# Patient Record
Sex: Female | Born: 1950
Health system: Southern US, Community
[De-identification: ages and names within clinical notes are randomized; demographics above are authoritative.]

## PROBLEM LIST (undated history)

## (undated) DIAGNOSIS — R112 Nausea with vomiting, unspecified: Secondary | ICD-10-CM

## (undated) DIAGNOSIS — Z9889 Other specified postprocedural states: Secondary | ICD-10-CM

## (undated) DIAGNOSIS — M1990 Unspecified osteoarthritis, unspecified site: Secondary | ICD-10-CM

## (undated) DIAGNOSIS — E119 Type 2 diabetes mellitus without complications: Secondary | ICD-10-CM

## (undated) DIAGNOSIS — E079 Disorder of thyroid, unspecified: Secondary | ICD-10-CM

## (undated) DIAGNOSIS — E7800 Pure hypercholesterolemia, unspecified: Secondary | ICD-10-CM

## (undated) DIAGNOSIS — I10 Essential (primary) hypertension: Secondary | ICD-10-CM

## (undated) DIAGNOSIS — E039 Hypothyroidism, unspecified: Secondary | ICD-10-CM

## (undated) HISTORY — DX: Unspecified osteoarthritis, unspecified site: M19.90

## (undated) HISTORY — PX: BREAST BIOPSY: SHX20

## (undated) HISTORY — PX: CARPAL TUNNEL RELEASE: SHX101

## (undated) HISTORY — PX: JOINT REPLACEMENT: SHX530

## (undated) HISTORY — PX: KNEE CARTILAGE SURGERY: SHX688

---

## 2000-11-17 ENCOUNTER — Other Ambulatory Visit: Admission: RE | Admit: 2000-11-17 | Discharge: 2000-11-17 | Payer: Self-pay | Admitting: Family Medicine

## 2002-02-26 ENCOUNTER — Other Ambulatory Visit: Admission: RE | Admit: 2002-02-26 | Discharge: 2002-02-26 | Payer: Self-pay | Admitting: Family Medicine

## 2002-03-06 ENCOUNTER — Ambulatory Visit (HOSPITAL_COMMUNITY): Admission: RE | Admit: 2002-03-06 | Discharge: 2002-03-06 | Payer: Self-pay | Admitting: Family Medicine

## 2002-03-06 ENCOUNTER — Encounter: Payer: Self-pay | Admitting: Family Medicine

## 2003-03-20 ENCOUNTER — Other Ambulatory Visit: Admission: RE | Admit: 2003-03-20 | Discharge: 2003-03-20 | Payer: Self-pay | Admitting: Family Medicine

## 2003-03-26 ENCOUNTER — Ambulatory Visit (HOSPITAL_COMMUNITY): Admission: RE | Admit: 2003-03-26 | Discharge: 2003-03-26 | Payer: Self-pay | Admitting: Family Medicine

## 2004-03-25 ENCOUNTER — Other Ambulatory Visit: Admission: RE | Admit: 2004-03-25 | Discharge: 2004-03-25 | Payer: Self-pay | Admitting: Family Medicine

## 2004-03-30 ENCOUNTER — Ambulatory Visit (HOSPITAL_COMMUNITY): Admission: RE | Admit: 2004-03-30 | Discharge: 2004-03-30 | Payer: Self-pay | Admitting: Family Medicine

## 2004-10-26 ENCOUNTER — Ambulatory Visit (HOSPITAL_COMMUNITY): Admission: RE | Admit: 2004-10-26 | Discharge: 2004-10-26 | Payer: Self-pay | Admitting: Orthopaedic Surgery

## 2004-10-29 ENCOUNTER — Encounter: Admission: RE | Admit: 2004-10-29 | Discharge: 2004-11-18 | Payer: Self-pay | Admitting: Orthopaedic Surgery

## 2005-04-04 ENCOUNTER — Ambulatory Visit (HOSPITAL_COMMUNITY): Admission: RE | Admit: 2005-04-04 | Discharge: 2005-04-04 | Payer: Self-pay | Admitting: Family Medicine

## 2005-05-04 ENCOUNTER — Ambulatory Visit (HOSPITAL_COMMUNITY): Admission: RE | Admit: 2005-05-04 | Discharge: 2005-05-04 | Payer: Self-pay | Admitting: Family Medicine

## 2005-05-05 ENCOUNTER — Other Ambulatory Visit: Admission: RE | Admit: 2005-05-05 | Discharge: 2005-05-05 | Payer: Self-pay | Admitting: Family Medicine

## 2005-11-16 ENCOUNTER — Ambulatory Visit (HOSPITAL_COMMUNITY): Admission: RE | Admit: 2005-11-16 | Discharge: 2005-11-16 | Payer: Self-pay | Admitting: Family Medicine

## 2006-04-06 ENCOUNTER — Ambulatory Visit (HOSPITAL_COMMUNITY): Admission: RE | Admit: 2006-04-06 | Discharge: 2006-04-06 | Payer: Self-pay | Admitting: Family Medicine

## 2006-05-30 ENCOUNTER — Other Ambulatory Visit: Admission: RE | Admit: 2006-05-30 | Discharge: 2006-05-30 | Payer: Self-pay | Admitting: Family Medicine

## 2007-04-13 ENCOUNTER — Ambulatory Visit (HOSPITAL_COMMUNITY): Admission: RE | Admit: 2007-04-13 | Discharge: 2007-04-13 | Payer: Self-pay | Admitting: Family Medicine

## 2007-06-25 ENCOUNTER — Ambulatory Visit: Payer: Self-pay | Admitting: Gastroenterology

## 2007-07-03 ENCOUNTER — Encounter: Payer: Self-pay | Admitting: Gastroenterology

## 2007-07-03 ENCOUNTER — Ambulatory Visit: Payer: Self-pay | Admitting: Gastroenterology

## 2007-07-06 ENCOUNTER — Encounter: Payer: Self-pay | Admitting: Gastroenterology

## 2008-04-16 ENCOUNTER — Ambulatory Visit (HOSPITAL_COMMUNITY): Admission: RE | Admit: 2008-04-16 | Discharge: 2008-04-16 | Payer: Self-pay | Admitting: Family Medicine

## 2008-09-19 ENCOUNTER — Ambulatory Visit (HOSPITAL_COMMUNITY): Admission: RE | Admit: 2008-09-19 | Discharge: 2008-09-19 | Payer: Self-pay | Admitting: Orthopaedic Surgery

## 2009-04-17 ENCOUNTER — Ambulatory Visit (HOSPITAL_COMMUNITY): Admission: RE | Admit: 2009-04-17 | Discharge: 2009-04-17 | Payer: Self-pay | Admitting: Family Medicine

## 2010-02-08 ENCOUNTER — Emergency Department (HOSPITAL_COMMUNITY)
Admission: EM | Admit: 2010-02-08 | Discharge: 2010-02-08 | Payer: Self-pay | Source: Home / Self Care | Admitting: Emergency Medicine

## 2010-02-11 ENCOUNTER — Ambulatory Visit (HOSPITAL_COMMUNITY)
Admission: RE | Admit: 2010-02-11 | Discharge: 2010-02-11 | Payer: Self-pay | Source: Home / Self Care | Attending: Orthopaedic Surgery | Admitting: Orthopaedic Surgery

## 2010-03-30 ENCOUNTER — Other Ambulatory Visit: Payer: Self-pay | Admitting: Family Medicine

## 2010-03-30 DIAGNOSIS — Z139 Encounter for screening, unspecified: Secondary | ICD-10-CM

## 2010-04-19 ENCOUNTER — Ambulatory Visit (HOSPITAL_COMMUNITY): Payer: BC Managed Care – PPO

## 2010-04-22 ENCOUNTER — Ambulatory Visit (HOSPITAL_COMMUNITY)
Admission: RE | Admit: 2010-04-22 | Discharge: 2010-04-22 | Disposition: A | Discharge status: Home / Self Care | Payer: BC Managed Care – PPO | Source: Ambulatory Visit | Attending: Family Medicine | Admitting: Family Medicine

## 2010-04-22 DIAGNOSIS — Z1231 Encounter for screening mammogram for malignant neoplasm of breast: Secondary | ICD-10-CM | POA: Insufficient documentation

## 2010-04-22 DIAGNOSIS — Z139 Encounter for screening, unspecified: Secondary | ICD-10-CM

## 2010-07-16 NOTE — Op Note (Signed)
Mckenzie Leonard, Mckenzie Leonard                ACCOUNT NO.:  [id number]   MEDICAL RECORD NO.:  [id number]          PATIENT TYPE:  AMB   LOCATION:  DAY                           FACILITY:  [facility]   PHYSICIAN:  J. Darreld Mclean, M.D. DATE OF BIRTH:  09-12-1950   DATE OF PROCEDURE:  10/26/2004  DATE OF DISCHARGE:                                 OPERATIVE REPORT   PREOPERATIVE DIAGNOSIS:  Tear of the medial meniscus of the left knee.   POSTOPERATIVE DIAGNOSIS:  Tear of the medial meniscus of the left knee.   PROCEDURE:  Operative arthroscopy, partial medial meniscectomy using a  laser.   ANESTHESIA:  General.   TOURNIQUET TIME:  21 minutes.   DRAINS:  No drains.   SURGEON:  Dr. Hilda Lias.   INDICATIONS:  The patient is a 60 year old female with pain and tenderness  in the left knee. MRI showed some ________________ degeneration in the left  knee medial meniscus area, but she continued to have pain and tenderness and  her knee giving way, appears very painful, very tender, recurring effusions.  She is not responsive to conservative treatment. Recommend arthroscopy.  Risks and imponderables were discussed preoperatively with the patient who  appeared to understand and agreed to the procedure as outlined.   OPERATIVE FINDINGS:  Suprapatellar pouch looked normal. Insertion of the  patella had mild grade 2 changes more medially.   Medial side of the knee had grade 2 changes, particularly the femoral  condyle. There was a small tear of the posterior horn of the medial  meniscus. Anterior cruciate was intact. Lateral side looked normal with  normal appearing meniscus. No loose bodies.   DESCRIPTION OF PROCEDURE:  The patient was seen in the holding area. The  left knee was identified as the correct surgical site. She placed a mark on  the left knee; I placed a mark on the left knee. She was brought back to the  operating room. She was given general anesthesia while supine on the  operating room  table. Tourniquet placed deflated left upper thigh. Leg  holder attached. Patient prepped and draped in the usual manner. We had a  time out and identified the patient once more and identified the left knee  as the correct surgical site. Left leg was elevated. Esmarch bandage was  wrapped circumferentially. Tourniquet inflated to 300 mmHg, and Esmarch  bandage removed. Inflow cannula inserted medially, lactated ringers  instilled into the knee by an effusion pump. Arthroscope inserted laterally.  Knee systematically examined. Please see findings above. Using a laser, I  was able from another portal to get a good smooth contour of the tear. Probe  was also used. Good smooth contour was obtained. No new pathology was found.  Knee was systematically reexamined. Wounds were irrigated with remaining  part of lactated ringers. Wounds were reapproximated using 3-0 nylon  interrupted vertical mattress manner. Marcaine 0.25% instilled in each  portal. Tourniquet deflated after 21 minutes. Shoulder dressing applied.  Bulky dressing applied. Knee immobilizer applied. The patient tolerated the  procedure well and will go to recovery in good  condition. Prescription for  Vicodin ES given for pain. I will see her in the office approximately 10  days to 2 weeks. Physical therapy has been arranged. If any difficulties,  contact me through numbers that have been provided.           ______________________________  Shela Commons. Darreld Mclean, M.D.     JWK/MEDQ  D:  10/26/2004  T:  10/26/2004  Job:  [id number]

## 2010-07-16 NOTE — H&P (Signed)
Mckenzie Leonard, Mckenzie Leonard                ACCOUNT NO.:  [id number]   MEDICAL RECORD NO.:  [id number]          PATIENT TYPE:  AMB   LOCATION:  DAY                           FACILITY:  [facility]   PHYSICIAN:  J. Darreld Mclean, M.D. DATE OF BIRTH:  November 10, 1950   DATE OF ADMISSION:  DATE OF DISCHARGE:  LH                                HISTORY & PHYSICAL   CHIEF COMPLAINT:  Left knee pain.   HISTORY OF PRESENT ILLNESS:  The patient is a 60 year old female with pain  and tenderness in her left knee that has been present since the middle of  July.  Seen at [facility] ER on September 18, 2004.  X-rays of the knee were taken  and were negative.  She had an MRI of the left knee on September 22, 2004 at  [facility] showing some myxoid degeneration of the medial meniscus with no  tears.  The ligaments were normal.  She had pain and tenderness in her left  knee, particularly in the medial joint line.  I saw her in the office on  October 01, 2004.  I injected her knee with Depo-Medrol and gave her a knee  sleeve.  She was given Voltaren.  The pain has continued of her knee.  She  has giving way of the knee.  She has swelling of the knee.  The knee is  tender medially.  We tried several weeks of rest, anti-inflammatories, and  still has significant pain in her knee, and it is giving way more.  She may  have a meniscal tear medially, despite the MRI report.  Since she is not  improving, she is getting worse, with more swelling and more pain, have  recommended arthroscopy of the knee.  Risks and __________ were discussed.  She understands and agrees to the procedure as outlined.   PAST HISTORY:  Positive for hypertension.  Denies other medical problems.   ALLERGIES:  1.  SULFA.  2.  TETANUS.   CURRENT MEDICATIONS:  1.  AcipHex 20 mg daily.  2.  Diovan HCT 165 mg daily.  3.  Voltaren 75 mg twice a day.  4.  Vicodin ES for pain.   SOCIAL HISTORY:  She does not smoke.  Does not use alcoholic beverages.  A  high school  graduate.   FAMILY MEDICAL DOCTOR:  [facility] [place].   PAST SURGICAL HISTORY:  1.  She is status post carpal tunnel 6 years ago by me.  2.  Right breast biopsy 4 years ago by Dr. Gabriel Cirri.   FAMILY HISTORY:  Hypertension and heart troubles run in the family.   PHYSICAL EXAMINATION:  VITAL SIGNS:  Blood pressure is 128/82, respirations  16, afebrile, pulse 84.  Height 5 feet, 2.5 inches.  Weight 172.5 pounds.  GENERAL:  The patient is alert, cooperative, and oriented.  HEENT:  Negative.  NECK:  Supple.  LUNGS:  Clear to percussion and auscultation.  HEART:  Regular rhythm without murmur heard.  ABDOMEN:  Soft and tender without masses.  EXTREMITIES:  Her left knee is tender and has an effusion.  Particularly  tender in the medial joint line.  Positive McMurray medially.  Other joints  within normal limits.  CNS:  Intact.  SKIN:  Intact.   IMPRESSION:  Probably medial meniscal tear, left knee.   PLAN:  Arthroscopy of the left knee.   I have discussed with the patient the planned procedure, risks, and  __________.  Labs are pending.                                            ______________________________  J. Darreld Mclean, M.D.     JWK/MEDQ  D:  10/22/2004  T:  10/22/2004  Job:  [id number]

## 2011-04-04 ENCOUNTER — Other Ambulatory Visit: Payer: Self-pay | Admitting: Nurse Practitioner

## 2011-04-04 DIAGNOSIS — Z139 Encounter for screening, unspecified: Secondary | ICD-10-CM

## 2011-04-26 ENCOUNTER — Ambulatory Visit (HOSPITAL_COMMUNITY): Payer: BC Managed Care – PPO

## 2011-05-02 ENCOUNTER — Ambulatory Visit (HOSPITAL_COMMUNITY)
Admission: RE | Admit: 2011-05-02 | Discharge: 2011-05-02 | Disposition: A | Discharge status: Home / Self Care | Payer: BC Managed Care – PPO | Source: Ambulatory Visit | Attending: Nurse Practitioner | Admitting: Nurse Practitioner

## 2011-05-02 DIAGNOSIS — Z139 Encounter for screening, unspecified: Secondary | ICD-10-CM

## 2011-05-02 DIAGNOSIS — Z1231 Encounter for screening mammogram for malignant neoplasm of breast: Secondary | ICD-10-CM | POA: Insufficient documentation

## 2012-02-18 ENCOUNTER — Inpatient Hospital Stay (HOSPITAL_COMMUNITY)
Admission: EM | Admit: 2012-02-18 | Discharge: 2012-02-21 | DRG: 982 | Disposition: A | Discharge status: Home / Self Care | Payer: Worker's Compensation | Attending: Oral and Maxillofacial Surgery | Admitting: Oral and Maxillofacial Surgery

## 2012-02-18 ENCOUNTER — Emergency Department (HOSPITAL_COMMUNITY): Payer: Worker's Compensation

## 2012-02-18 ENCOUNTER — Encounter (HOSPITAL_COMMUNITY): Payer: Self-pay | Admitting: *Deleted

## 2012-02-18 DIAGNOSIS — E876 Hypokalemia: Secondary | ICD-10-CM | POA: Diagnosis present

## 2012-02-18 DIAGNOSIS — Y9269 Other specified industrial and construction area as the place of occurrence of the external cause: Secondary | ICD-10-CM

## 2012-02-18 DIAGNOSIS — E7800 Pure hypercholesterolemia, unspecified: Secondary | ICD-10-CM | POA: Diagnosis present

## 2012-02-18 DIAGNOSIS — E039 Hypothyroidism, unspecified: Secondary | ICD-10-CM | POA: Diagnosis present

## 2012-02-18 DIAGNOSIS — I10 Essential (primary) hypertension: Secondary | ICD-10-CM | POA: Diagnosis present

## 2012-02-18 DIAGNOSIS — Z79899 Other long term (current) drug therapy: Secondary | ICD-10-CM

## 2012-02-18 DIAGNOSIS — S02109A Fracture of base of skull, unspecified side, initial encounter for closed fracture: Principal | ICD-10-CM | POA: Diagnosis present

## 2012-02-18 DIAGNOSIS — S02401A Maxillary fracture, unspecified, initial encounter for closed fracture: Secondary | ICD-10-CM | POA: Diagnosis present

## 2012-02-18 DIAGNOSIS — S02400A Malar fracture unspecified, initial encounter for closed fracture: Secondary | ICD-10-CM | POA: Diagnosis present

## 2012-02-18 DIAGNOSIS — S0292XA Unspecified fracture of facial bones, initial encounter for closed fracture: Secondary | ICD-10-CM | POA: Diagnosis present

## 2012-02-18 DIAGNOSIS — S0219XA Other fracture of base of skull, initial encounter for closed fracture: Secondary | ICD-10-CM | POA: Diagnosis present

## 2012-02-18 DIAGNOSIS — E1159 Type 2 diabetes mellitus with other circulatory complications: Secondary | ICD-10-CM | POA: Insufficient documentation

## 2012-02-18 DIAGNOSIS — E785 Hyperlipidemia, unspecified: Secondary | ICD-10-CM | POA: Insufficient documentation

## 2012-02-18 DIAGNOSIS — S069X9A Unspecified intracranial injury with loss of consciousness of unspecified duration, initial encounter: Secondary | ICD-10-CM

## 2012-02-18 DIAGNOSIS — E119 Type 2 diabetes mellitus without complications: Secondary | ICD-10-CM | POA: Diagnosis present

## 2012-02-18 DIAGNOSIS — S0285XA Fracture of orbit, unspecified, initial encounter for closed fracture: Secondary | ICD-10-CM

## 2012-02-18 DIAGNOSIS — S020XXA Fracture of vault of skull, initial encounter for closed fracture: Secondary | ICD-10-CM

## 2012-02-18 DIAGNOSIS — S0280XA Fracture of other specified skull and facial bones, unspecified side, initial encounter for closed fracture: Secondary | ICD-10-CM | POA: Diagnosis present

## 2012-02-18 DIAGNOSIS — Y990 Civilian activity done for income or pay: Secondary | ICD-10-CM

## 2012-02-18 DIAGNOSIS — IMO0002 Reserved for concepts with insufficient information to code with codable children: Secondary | ICD-10-CM

## 2012-02-18 DIAGNOSIS — D62 Acute posthemorrhagic anemia: Secondary | ICD-10-CM | POA: Diagnosis present

## 2012-02-18 DIAGNOSIS — S098XXA Other specified injuries of head, initial encounter: Secondary | ICD-10-CM | POA: Diagnosis present

## 2012-02-18 DIAGNOSIS — F411 Generalized anxiety disorder: Secondary | ICD-10-CM | POA: Diagnosis present

## 2012-02-18 DIAGNOSIS — E1169 Type 2 diabetes mellitus with other specified complication: Secondary | ICD-10-CM | POA: Insufficient documentation

## 2012-02-18 DIAGNOSIS — W3189XA Contact with other specified machinery, initial encounter: Secondary | ICD-10-CM

## 2012-02-18 HISTORY — DX: Pure hypercholesterolemia, unspecified: E78.00

## 2012-02-18 HISTORY — DX: Essential (primary) hypertension: I10

## 2012-02-18 HISTORY — DX: Type 2 diabetes mellitus without complications: E11.9

## 2012-02-18 HISTORY — DX: Disorder of thyroid, unspecified: E07.9

## 2012-02-18 LAB — GLUCOSE, CAPILLARY: Glucose-Capillary: 160 mg/dL — ABNORMAL HIGH (ref 70–99)

## 2012-02-18 LAB — CBC WITH DIFFERENTIAL/PLATELET
Basophils Absolute: 0 10*3/uL (ref 0.0–0.1)
Basophils Relative: 0 % (ref 0–1)
Eosinophils Relative: 1 % (ref 0–5)
HCT: 35.8 % — ABNORMAL LOW (ref 36.0–46.0)
Lymphocytes Relative: 14 % (ref 12–46)
Lymphs Abs: 2 10*3/uL (ref 0.7–4.0)
MCHC: 33 g/dL (ref 30.0–36.0)
Monocytes Absolute: 0.7 10*3/uL (ref 0.1–1.0)
Neutro Abs: 11.7 10*3/uL — ABNORMAL HIGH (ref 1.7–7.7)
Neutrophils Relative %: 80 % — ABNORMAL HIGH (ref 43–77)
RBC: 4.02 MIL/uL (ref 3.87–5.11)

## 2012-02-18 LAB — BASIC METABOLIC PANEL
BUN: 19 mg/dL (ref 6–23)
GFR calc Af Amer: >90 mL/min (ref >90)
GFR calc non Af Amer: >90 mL/min (ref >90)
Glucose, Bld: 115 mg/dL — ABNORMAL HIGH (ref 70–99)
Sodium: 140 mEq/L (ref 135–145)

## 2012-02-18 MED ORDER — ONDANSETRON HCL 4 MG/2ML IJ SOLN
INTRAMUSCULAR | Status: AC
  Administered 2012-02-18: 4 mg via INTRAVENOUS
  Filled 2012-02-18: qty 2

## 2012-02-18 MED ORDER — PREDNISONE 50 MG PO TABS
60.0000 mg | ORAL_TABLET | Freq: Every day | ORAL | Status: AC
  Administered 2012-02-18 – 2012-02-20 (×3): 60 mg via ORAL
  Filled 2012-02-18 (×5): qty 1

## 2012-02-18 MED ORDER — TETANUS-DIPHTH-ACELL PERTUSSIS 5-2.5-18.5 LF-MCG/0.5 IM SUSP
0.5000 mL | Freq: Once | INTRAMUSCULAR | Status: AC
  Administered 2012-02-18: 0.5 mL via INTRAMUSCULAR

## 2012-02-18 MED ORDER — ONDANSETRON HCL 4 MG/2ML IJ SOLN
4.0000 mg | Freq: Once | INTRAMUSCULAR | Status: AC
  Administered 2012-02-18: 4 mg via INTRAVENOUS
  Filled 2012-02-18: qty 2

## 2012-02-18 MED ORDER — ONDANSETRON HCL 4 MG PO TABS: 4.0000 mg | ORAL_TABLET | Freq: Four times a day (QID) | ORAL | Status: DC | PRN

## 2012-02-18 MED ORDER — TETANUS-DIPHTH-ACELL PERTUSSIS 5-2.5-18.5 LF-MCG/0.5 IM SUSP
0.5000 mL | Freq: Once | INTRAMUSCULAR | Status: DC
  Filled 2012-02-18: qty 0.5

## 2012-02-18 MED ORDER — DEXAMETHASONE SODIUM PHOSPHATE 10 MG/ML IJ SOLN
10.0000 mg | Freq: Once | INTRAMUSCULAR | Status: AC
  Administered 2012-02-18: 10 mg via INTRAVENOUS
  Filled 2012-02-18: qty 1

## 2012-02-18 MED ORDER — MORPHINE SULFATE 4 MG/ML IJ SOLN
4.0000 mg | Freq: Once | INTRAMUSCULAR | Status: AC
  Administered 2012-02-18: 4 mg via INTRAVENOUS
  Filled 2012-02-18: qty 1

## 2012-02-18 MED ORDER — POTASSIUM CHLORIDE 10 MEQ/100ML IV SOLN
10.0000 meq | Freq: Once | INTRAVENOUS | Status: AC
  Administered 2012-02-18: 10 meq via INTRAVENOUS
  Filled 2012-02-18: qty 100

## 2012-02-18 MED ORDER — ONDANSETRON HCL 4 MG/2ML IJ SOLN
4.0000 mg | Freq: Once | INTRAMUSCULAR | Status: AC
  Administered 2012-02-18: 4 mg via INTRAVENOUS

## 2012-02-18 MED ORDER — DOCUSATE SODIUM 100 MG PO CAPS
100.0000 mg | ORAL_CAPSULE | Freq: Two times a day (BID) | ORAL | Status: DC
  Administered 2012-02-18 – 2012-02-20 (×5): 100 mg via ORAL
  Filled 2012-02-18 (×5): qty 1

## 2012-02-18 MED ORDER — ONDANSETRON HCL 4 MG/2ML IJ SOLN
4.0000 mg | Freq: Four times a day (QID) | INTRAMUSCULAR | Status: DC | PRN
  Administered 2012-02-18 – 2012-02-19 (×3): 4 mg via INTRAVENOUS
  Filled 2012-02-18 (×2): qty 2

## 2012-02-18 MED ORDER — POLYETHYLENE GLYCOL 3350 17 G PO PACK
17.0000 g | PACK | Freq: Every day | ORAL | Status: DC
  Administered 2012-02-19: 17 g via ORAL
  Filled 2012-02-18 (×4): qty 1

## 2012-02-18 MED ORDER — MORPHINE SULFATE 4 MG/ML IJ SOLN
4.0000 mg | Freq: Once | INTRAMUSCULAR | Status: AC
  Administered 2012-02-18: 4 mg via INTRAVENOUS

## 2012-02-18 MED ORDER — HYDROCODONE-ACETAMINOPHEN 10-325 MG PO TABS
0.5000 | ORAL_TABLET | ORAL | Status: DC | PRN
  Administered 2012-02-19: 1 via ORAL
  Administered 2012-02-21: 2 via ORAL
  Filled 2012-02-18: qty 2
  Filled 2012-02-18: qty 1

## 2012-02-18 MED ORDER — SODIUM CHLORIDE 0.45 % IV SOLN
INTRAVENOUS | Status: DC
  Administered 2012-02-18 – 2012-02-20 (×3): via INTRAVENOUS
  Filled 2012-02-18 (×11): qty 1000

## 2012-02-18 MED ORDER — INSULIN ASPART 100 UNIT/ML ~~LOC~~ SOLN
0.0000 [IU] | Freq: Three times a day (TID) | SUBCUTANEOUS | Status: DC
  Administered 2012-02-18: 4 [IU] via SUBCUTANEOUS
  Administered 2012-02-19: 3 [IU] via SUBCUTANEOUS

## 2012-02-18 MED ORDER — HYDROMORPHONE HCL PF 1 MG/ML IJ SOLN
0.5000 mg | INTRAMUSCULAR | Status: DC | PRN
  Administered 2012-02-18 – 2012-02-20 (×4): 0.5 mg via INTRAVENOUS
  Filled 2012-02-18 (×4): qty 1

## 2012-02-18 MED ORDER — ONDANSETRON HCL 4 MG/2ML IJ SOLN
INTRAMUSCULAR | Status: AC
  Filled 2012-02-18: qty 2

## 2012-02-18 MED ORDER — MORPHINE SULFATE 4 MG/ML IJ SOLN
INTRAMUSCULAR | Status: AC
  Filled 2012-02-18: qty 1

## 2012-02-18 MED ORDER — POTASSIUM CHLORIDE CRYS ER 20 MEQ PO TBCR
40.0000 meq | EXTENDED_RELEASE_TABLET | Freq: Two times a day (BID) | ORAL | Status: DC
  Administered 2012-02-18 (×2): 40 meq via ORAL
  Filled 2012-02-18 (×4): qty 2

## 2012-02-18 NOTE — ED Provider Notes (Signed)
History     CSN: [id number]  Arrival date & time 02/18/12  0709   First MD Initiated Contact with Patient 02/18/12 0710      Chief Complaint  Patient presents with  . Head Injury    (Consider location/radiation/quality/duration/timing/severity/associated sxs/prior treatment) Patient is a 61 y.o. female presenting with head injury. The history is provided by the patient and the EMS personnel.  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 She denies loss of consciousness. She denies other injury. Pain is severe and she rates it 10/10. She was brought in by ambulance who reports no difficulty with airway. She does not on her last tetanus immunization was.  No past medical history on file.  No past surgical history on file.  No family history on file.  History  Substance Use Topics  . Smoking status: Not on file  . Smokeless tobacco: Not on file  . Alcohol Use: Not on file    OB History    No data available      Review of Systems  All other systems reviewed and are negative.    Allergies  Review of patient's allergies indicates not on file.  Home Medications  No current outpatient prescriptions on file.  There were no vitals taken for this visit.  Physical Exam  Nursing note and vitals reviewed. 61 year old female, on a long spine board with stiff cervical collar in place and in no acute distress. Vital signs are significant for hypertension with blood pressure 153/79. Oxygen saturation is 100%, which is normal. Head is normocephalic. There is soft tissue swelling of the right side of the face with periorbital ecchymosis on the right. PERRLA, EOMI. No restriction in extra ocular movement is noted. Oropharynx is clear. Small laceration is noted on the upper lip-not requiring closure. Neck is nontender without adenopathy or JVD. Back is nontender and there is no CVA tenderness. Lungs are clear  without rales, wheezes, or rhonchi. Chest is nontender. Heart has regular rate and rhythm without murmur. Abdomen is soft, flat, nontender without masses or hepatosplenomegaly and peristalsis is normoactive. Extremities have no cyanosis or edema, full range of motion is present. Skin is warm and dry without rash. Neurologic: Mental status is normal, cranial nerves are intact, there are no motor or sensory deficits.   ED Course  Procedures (including critical care time)  Results for orders placed during the hospital encounter of 02/18/12  CBC WITH DIFFERENTIAL      Component Value Range   WBC 14.6 (*) 4.0 - 10.5 K/uL   RBC 4.02  3.87 - 5.11 MIL/uL   Hemoglobin 11.8 (*) 12.0 - 15.0 g/dL   HCT 82.9 (*) 56.2 - 13.0 %   MCV 89.1  78.0 - 100.0 fL   MCH 29.4  26.0 - 34.0 pg   MCHC 33.0  30.0 - 36.0 g/dL   RDW 86.5  78.4 - 69.6 %   Platelets 254  150 - 400 K/uL   Neutrophils Relative 80 (*) 43 - 77 %   Neutro Abs 11.7 (*) 1.7 - 7.7 K/uL   Lymphocytes Relative 14  12 - 46 %   Lymphs Abs 2.0  0.7 - 4.0 K/uL   Monocytes Relative 5  3 - 12 %   Monocytes Absolute 0.7  0.1 - 1.0 K/uL   Eosinophils Relative 1  0 - 5 %   Eosinophils Absolute  0.1  0.0 - 0.7 K/uL   Basophils Relative 0  0 - 1 %   Basophils Absolute 0.0  0.0 - 0.1 K/uL  BASIC METABOLIC PANEL      Component Value Range   Sodium 140  135 - 145 mEq/L   Potassium 2.9 (*) 3.5 - 5.1 mEq/L   Chloride 102  96 - 112 mEq/L   CO2 24  19 - 32 mEq/L   Glucose, Bld 115 (*) 70 - 99 mg/dL   BUN 19  6 - 23 mg/dL   Creatinine, Ser 1.61  0.50 - 1.10 mg/dL   Calcium 8.9  8.4 - 09.6 mg/dL   GFR calc non Af Amer >90  >90 mL/min   GFR calc Af Amer >90  >90 mL/min  GLUCOSE, CAPILLARY      Component Value Range   Glucose-Capillary 159 (*) 70 - 99 mg/dL   Dg Shoulder Right  04/54/0981  *RADIOLOGY REPORT*  Clinical Data: Pain post trauma  RIGHT SHOULDER - 2+ VIEW  Comparison: None.  Findings: Three views of the right shoulder submitted.  No  acute fracture or subluxation.  Mild degenerative changes right AC joint.  IMPRESSION: No acute fracture or subluxation.  Mild degenerative changes right AC joint.   Original Report Authenticated By: Natasha Mead, M.D.    Ct Head Wo Contrast  02/18/2012  *RADIOLOGY REPORT*  Clinical Data:  Blunt trauma to the face at work, hit in right side of face and eye with swelling  CT HEAD WITHOUT CONTRAST CT MAXILLOFACIAL WITHOUT CONTRAST CT CERVICAL SPINE WITHOUT CONTRAST  Technique:  Multidetector CT imaging of the head, cervical spine, and maxillofacial structures were performed using the standard protocol without intravenous contrast. Multiplanar CT image reconstructions of the cervical spine and maxillofacial structures were also generated.  Comparison:  CT brain of 01/31/2009  CT HEAD  Findings: The ventricular system is stable in size and configuration, and the septum remains midline position.  No hemorrhage, mass lesion, or acute infarction is seen.  A right facial scalp hematoma is noted which will be better assessed on CT maxillofacial.  On bone window images, fractures of the right maxillary sinus and right zygomatic arch are noted with opacification of the right maxillary sinus.  The right orbital rim also is fractured.  There is a small amount of periorbital air. A minimally depressed fracture of the right temporal calvarium is noted.  IMPRESSION:  1.  No acute intracranial abnormality. 2.  Fractures of the right maxillary sinus, right zygomatic arch, and right orbital rim with some periorbital air on the right. 3.  Slightly depressed fracture of the right temporal calvarium.  CT MAXILLOFACIAL  Findings:  On bone window images, there is fracture of the anterior, lateral, and posterior walls of the right maxillary sinus with complete opacification of the right maxillary sinus.  There is comminuted fracture of the right zygomatic arch involving the frontozygomatic, mid zygomatic, and posterior aspects of the  zygomatic arch with some displacement.  There is fracture of the right lateral orbital rim with slight displacement and some narrowing of the optic canal superiorly.  There is also a fracture of the right temporal calvarium with slight depression. There is a large scalp hematoma overlying the right lateral orbital rim and right temporal calvarium with some air in the soft tissues.  The mastoid air cells appear pneumatized.  No mandibular fracture is seen.  Both mandibular condyles appear to be in normal position. The left frontal sinus is aerated.  The nasal airway is patent and the turbinates are normal position.  IMPRESSION:  1.  Comminuted fracture of the anterior, lateral, and posterior walls of the right maxillary sinus with complete opacification of the right maxillary sinus. 2.  Fracture of the right lateral orbital rim with some periorbital air. 3.  Comminuted fracture of the right zygomatic arch with displacement of fragments. 4.  Slightly depressed fracture of the right temporal calvarium with overlying scalp hematoma.  CT CERVICAL SPINE  Findings:   The cervical vertebrae are in normal alignment.  There is degenerative disc disease particularly at the C6-7 level with loss of disc space, sclerosis, and spurring.  No prevertebral soft tissue swelling is seen.  The odontoid process is intact.  No cervical spine fracture is seen.  Foraminal narrowing is noted particularly the C6-7 level on the right.  The thyroid gland is unremarkable.  IMPRESSION:  1.  Normal alignment.  No acute fracture. 2.  Degenerative disc disease primarily at C6-7.   Original Report Authenticated By: Dwyane Dee, M.D.    Ct Cervical Spine Wo Contrast  02/18/2012  *RADIOLOGY REPORT*  Clinical Data:  Blunt trauma to the face at work, hit in right side of face and eye with swelling  CT HEAD WITHOUT CONTRAST CT MAXILLOFACIAL WITHOUT CONTRAST CT CERVICAL SPINE WITHOUT CONTRAST  Technique:  Multidetector CT imaging of the head, cervical  spine, and maxillofacial structures were performed using the standard protocol without intravenous contrast. Multiplanar CT image reconstructions of the cervical spine and maxillofacial structures were also generated.  Comparison:  CT brain of 01/31/2009  CT HEAD  Findings: The ventricular system is stable in size and configuration, and the septum remains midline position.  No hemorrhage, mass lesion, or acute infarction is seen.  A right facial scalp hematoma is noted which will be better assessed on CT maxillofacial.  On bone window images, fractures of the right maxillary sinus and right zygomatic arch are noted with opacification of the right maxillary sinus.  The right orbital rim also is fractured.  There is a small amount of periorbital air. A minimally depressed fracture of the right temporal calvarium is noted.  IMPRESSION:  1.  No acute intracranial abnormality. 2.  Fractures of the right maxillary sinus, right zygomatic arch, and right orbital rim with some periorbital air on the right. 3.  Slightly depressed fracture of the right temporal calvarium.  CT MAXILLOFACIAL  Findings:  On bone window images, there is fracture of the anterior, lateral, and posterior walls of the right maxillary sinus with complete opacification of the right maxillary sinus.  There is comminuted fracture of the right zygomatic arch involving the frontozygomatic, mid zygomatic, and posterior aspects of the zygomatic arch with some displacement.  There is fracture of the right lateral orbital rim with slight displacement and some narrowing of the optic canal superiorly.  There is also a fracture of the right temporal calvarium with slight depression. There is a large scalp hematoma overlying the right lateral orbital rim and right temporal calvarium with some air in the soft tissues.  The mastoid air cells appear pneumatized.  No mandibular fracture is seen.  Both mandibular condyles appear to be in normal position. The left  frontal sinus is aerated.  The nasal airway is patent and the turbinates are normal position.  IMPRESSION:  1.  Comminuted fracture of the anterior, lateral, and posterior walls of the right maxillary sinus with complete opacification of the right maxillary sinus. 2.  Fracture of  the right lateral orbital rim with some periorbital air. 3.  Comminuted fracture of the right zygomatic arch with displacement of fragments. 4.  Slightly depressed fracture of the right temporal calvarium with overlying scalp hematoma.  CT CERVICAL SPINE  Findings:   The cervical vertebrae are in normal alignment.  There is degenerative disc disease particularly at the C6-7 level with loss of disc space, sclerosis, and spurring.  No prevertebral soft tissue swelling is seen.  The odontoid process is intact.  No cervical spine fracture is seen.  Foraminal narrowing is noted particularly the C6-7 level on the right.  The thyroid gland is unremarkable.  IMPRESSION:  1.  Normal alignment.  No acute fracture. 2.  Degenerative disc disease primarily at C6-7.   Original Report Authenticated By: Dwyane Dee, M.D.    Ct Maxillofacial Wo Cm  02/18/2012  *RADIOLOGY REPORT*  Clinical Data:  Blunt trauma to the face at work, hit in right side of face and eye with swelling  CT HEAD WITHOUT CONTRAST CT MAXILLOFACIAL WITHOUT CONTRAST CT CERVICAL SPINE WITHOUT CONTRAST  Technique:  Multidetector CT imaging of the head, cervical spine, and maxillofacial structures were performed using the standard protocol without intravenous contrast. Multiplanar CT image reconstructions of the cervical spine and maxillofacial structures were also generated.  Comparison:  CT brain of 01/31/2009  CT HEAD  Findings: The ventricular system is stable in size and configuration, and the septum remains midline position.  No hemorrhage, mass lesion, or acute infarction is seen.  A right facial scalp hematoma is noted which will be better assessed on CT maxillofacial.  On bone  window images, fractures of the right maxillary sinus and right zygomatic arch are noted with opacification of the right maxillary sinus.  The right orbital rim also is fractured.  There is a small amount of periorbital air. A minimally depressed fracture of the right temporal calvarium is noted.  IMPRESSION:  1.  No acute intracranial abnormality. 2.  Fractures of the right maxillary sinus, right zygomatic arch, and right orbital rim with some periorbital air on the right. 3.  Slightly depressed fracture of the right temporal calvarium.  CT MAXILLOFACIAL  Findings:  On bone window images, there is fracture of the anterior, lateral, and posterior walls of the right maxillary sinus with complete opacification of the right maxillary sinus.  There is comminuted fracture of the right zygomatic arch involving the frontozygomatic, mid zygomatic, and posterior aspects of the zygomatic arch with some displacement.  There is fracture of the right lateral orbital rim with slight displacement and some narrowing of the optic canal superiorly.  There is also a fracture of the right temporal calvarium with slight depression. There is a large scalp hematoma overlying the right lateral orbital rim and right temporal calvarium with some air in the soft tissues.  The mastoid air cells appear pneumatized.  No mandibular fracture is seen.  Both mandibular condyles appear to be in normal position. The left frontal sinus is aerated.  The nasal airway is patent and the turbinates are normal position.  IMPRESSION:  1.  Comminuted fracture of the anterior, lateral, and posterior walls of the right maxillary sinus with complete opacification of the right maxillary sinus. 2.  Fracture of the right lateral orbital rim with some periorbital air. 3.  Comminuted fracture of the right zygomatic arch with displacement of fragments. 4.  Slightly depressed fracture of the right temporal calvarium with overlying scalp hematoma.  CT CERVICAL SPINE   Findings:  The cervical vertebrae are in normal alignment.  There is degenerative disc disease particularly at the C6-7 level with loss of disc space, sclerosis, and spurring.  No prevertebral soft tissue swelling is seen.  The odontoid process is intact.  No cervical spine fracture is seen.  Foraminal narrowing is noted particularly the C6-7 level on the right.  The thyroid gland is unremarkable.  IMPRESSION:  1.  Normal alignment.  No acute fracture. 2.  Degenerative disc disease primarily at C6-7.   Original Report Authenticated By: Dwyane Dee, M.D.    Images viewed by me.   1. Fracture, facial bones   2. Fracture of orbit     CRITICAL CARE Performed by: ZOXWR,UEAVW   Total critical care time: 40 minutes  Critical care time was exclusive of separately billable procedures and treating other patients.  Critical care was necessary to treat or prevent imminent or life-threatening deterioration.  Critical care was time spent personally by me on the following activities: development of treatment plan with patient and/or surrogate as well as nursing, discussions with consultants, evaluation of patient's response to treatment, examination of patient, obtaining history from patient or surrogate, ordering and performing treatments and interventions, ordering and review of laboratory studies, ordering and review of radiographic studies, pulse oximetry and re-evaluation of patient's condition.   MDM  Blunt facial trauma. She will be sent for CT scans. TdAP booster is given.  CT scans show fractures through the zygoma, maxilla sinus, and orbital floor as well as mildly depressed temporal bone fracture. She has required several injections of narcotics for pain control. I am concerned about the size of the hematoma which is present on CT scan and the patient's ability to take fluids as well as possible worsening of airway. Case is discussed with Dr. Eliott Nine of oral surgery who has come to evaluate the  patient, as well as Dr. Carolynne Edouard of trauma surgery, and Dr. Mikal Plane of neurosurgery. Patient will be admitted for observation and pain control. Per Dr. Deirdre Peer request, an initial dose of dexamethasone was given a try and minimize her swelling.    Dione Booze, MD 02/18/12 [phone]

## 2012-02-18 NOTE — Consult Note (Signed)
Reason for Consult:right temporal fracture Referring Physician: Trauma  Mckenzie Leonard is an 61 y.o. female.  HPI: whom was in her usual state of health until early this am when she was struck in the face by an industrial spool. She did not lose consciousness. Head CT [facility] revealed multiple facial fractures and a mildly depressed temporal bone fracture on the right.   Past Medical History  Diagnosis Date  . Hypertension   . Diabetes mellitus without complication   . Thyroid disease   . High cholesterol     Past Surgical History  Procedure Date  . Knee cartilage surgery     Right  . Carpal tunnel release     Right    History reviewed. No pertinent family history.  Social History:  reports that she has never smoked. She has never used smokeless tobacco. She reports that she does not drink alcohol or use illicit drugs.  Allergies:  Allergies  Allergen Reactions  . Sulfa Antibiotics Anaphylaxis  . Tetanus Toxoids     Reports flu like symptoms and swelling at injection site only    Medications: I have reviewed the patient's current medications.  Results for orders placed during the hospital encounter of 02/18/12 (from the past 48 hour(s))  CBC WITH DIFFERENTIAL     Status: Abnormal   Collection Time   02/18/12  7:18 AM      Component Value Range Comment   WBC 14.6 (*) 4.0 - 10.5 K/uL    RBC 4.02  3.87 - 5.11 MIL/uL    Hemoglobin 11.8 (*) 12.0 - 15.0 g/dL    HCT 45.4 (*) 09.8 - 46.0 %    MCV 89.1  78.0 - 100.0 fL    MCH 29.4  26.0 - 34.0 pg    MCHC 33.0  30.0 - 36.0 g/dL    RDW 11.9  14.7 - 82.9 %    Platelets 254  150 - 400 K/uL    Neutrophils Relative 80 (*) 43 - 77 %    Neutro Abs 11.7 (*) 1.7 - 7.7 K/uL    Lymphocytes Relative 14  12 - 46 %    Lymphs Abs 2.0  0.7 - 4.0 K/uL    Monocytes Relative 5  3 - 12 %    Monocytes Absolute 0.7  0.1 - 1.0 K/uL    Eosinophils Relative 1  0 - 5 %    Eosinophils Absolute 0.1  0.0 - 0.7 K/uL    Basophils Relative 0  0 - 1 %     Basophils Absolute 0.0  0.0 - 0.1 K/uL   BASIC METABOLIC PANEL     Status: Abnormal   Collection Time   02/18/12  7:18 AM      Component Value Range Comment   Sodium 140  135 - 145 mEq/L    Potassium 2.9 (*) 3.5 - 5.1 mEq/L    Chloride 102  96 - 112 mEq/L    CO2 24  19 - 32 mEq/L    Glucose, Bld 115 (*) 70 - 99 mg/dL    BUN 19  6 - 23 mg/dL    Creatinine, Ser 5.62  0.50 - 1.10 mg/dL    Calcium 8.9  8.4 - 13.0 mg/dL    GFR calc non Af Amer >90  >90 mL/min    GFR calc Af Amer >90  >90 mL/min     Dg Shoulder Right  02/18/2012  *RADIOLOGY REPORT*  Clinical Data: Pain post trauma  RIGHT  SHOULDER - 2+ VIEW  Comparison: None.  Findings: Three views of the right shoulder submitted.  No acute fracture or subluxation.  Mild degenerative changes right AC joint.  IMPRESSION: No acute fracture or subluxation.  Mild degenerative changes right AC joint.   Original Report Authenticated By: Natasha Mead, M.D.    Ct Head Wo Contrast  02/18/2012  *RADIOLOGY REPORT*  Clinical Data:  Blunt trauma to the face at work, hit in right side of face and eye with swelling  CT HEAD WITHOUT CONTRAST CT MAXILLOFACIAL WITHOUT CONTRAST CT CERVICAL SPINE WITHOUT CONTRAST  Technique:  Multidetector CT imaging of the head, cervical spine, and maxillofacial structures were performed using the standard protocol without intravenous contrast. Multiplanar CT image reconstructions of the cervical spine and maxillofacial structures were also generated.  Comparison:  CT brain of 01/31/2009  CT HEAD  Findings: The ventricular system is stable in size and configuration, and the septum remains midline position.  No hemorrhage, mass lesion, or acute infarction is seen.  A right facial scalp hematoma is noted which will be better assessed on CT maxillofacial.  On bone window images, fractures of the right maxillary sinus and right zygomatic arch are noted with opacification of the right maxillary sinus.  The right orbital rim also is  fractured.  There is a small amount of periorbital air. A minimally depressed fracture of the right temporal calvarium is noted.  IMPRESSION:  1.  No acute intracranial abnormality. 2.  Fractures of the right maxillary sinus, right zygomatic arch, and right orbital rim with some periorbital air on the right. 3.  Slightly depressed fracture of the right temporal calvarium.  CT MAXILLOFACIAL  Findings:  On bone window images, there is fracture of the anterior, lateral, and posterior walls of the right maxillary sinus with complete opacification of the right maxillary sinus.  There is comminuted fracture of the right zygomatic arch involving the frontozygomatic, mid zygomatic, and posterior aspects of the zygomatic arch with some displacement.  There is fracture of the right lateral orbital rim with slight displacement and some narrowing of the optic canal superiorly.  There is also a fracture of the right temporal calvarium with slight depression. There is a large scalp hematoma overlying the right lateral orbital rim and right temporal calvarium with some air in the soft tissues.  The mastoid air cells appear pneumatized.  No mandibular fracture is seen.  Both mandibular condyles appear to be in normal position. The left frontal sinus is aerated.  The nasal airway is patent and the turbinates are normal position.  IMPRESSION:  1.  Comminuted fracture of the anterior, lateral, and posterior walls of the right maxillary sinus with complete opacification of the right maxillary sinus. 2.  Fracture of the right lateral orbital rim with some periorbital air. 3.  Comminuted fracture of the right zygomatic arch with displacement of fragments. 4.  Slightly depressed fracture of the right temporal calvarium with overlying scalp hematoma.  CT CERVICAL SPINE  Findings:   The cervical vertebrae are in normal alignment.  There is degenerative disc disease particularly at the C6-7 level with loss of disc space, sclerosis, and  spurring.  No prevertebral soft tissue swelling is seen.  The odontoid process is intact.  No cervical spine fracture is seen.  Foraminal narrowing is noted particularly the C6-7 level on the right.  The thyroid gland is unremarkable.  IMPRESSION:  1.  Normal alignment.  No acute fracture. 2.  Degenerative disc disease primarily at C6-7.  Original Report Authenticated By: Dwyane Dee, M.D.    Ct Cervical Spine Wo Contrast  02/18/2012  *RADIOLOGY REPORT*  Clinical Data:  Blunt trauma to the face at work, hit in right side of face and eye with swelling  CT HEAD WITHOUT CONTRAST CT MAXILLOFACIAL WITHOUT CONTRAST CT CERVICAL SPINE WITHOUT CONTRAST  Technique:  Multidetector CT imaging of the head, cervical spine, and maxillofacial structures were performed using the standard protocol without intravenous contrast. Multiplanar CT image reconstructions of the cervical spine and maxillofacial structures were also generated.  Comparison:  CT brain of 01/31/2009  CT HEAD  Findings: The ventricular system is stable in size and configuration, and the septum remains midline position.  No hemorrhage, mass lesion, or acute infarction is seen.  A right facial scalp hematoma is noted which will be better assessed on CT maxillofacial.  On bone window images, fractures of the right maxillary sinus and right zygomatic arch are noted with opacification of the right maxillary sinus.  The right orbital rim also is fractured.  There is a small amount of periorbital air. A minimally depressed fracture of the right temporal calvarium is noted.  IMPRESSION:  1.  No acute intracranial abnormality. 2.  Fractures of the right maxillary sinus, right zygomatic arch, and right orbital rim with some periorbital air on the right. 3.  Slightly depressed fracture of the right temporal calvarium.  CT MAXILLOFACIAL  Findings:  On bone window images, there is fracture of the anterior, lateral, and posterior walls of the right maxillary sinus with  complete opacification of the right maxillary sinus.  There is comminuted fracture of the right zygomatic arch involving the frontozygomatic, mid zygomatic, and posterior aspects of the zygomatic arch with some displacement.  There is fracture of the right lateral orbital rim with slight displacement and some narrowing of the optic canal superiorly.  There is also a fracture of the right temporal calvarium with slight depression. There is a large scalp hematoma overlying the right lateral orbital rim and right temporal calvarium with some air in the soft tissues.  The mastoid air cells appear pneumatized.  No mandibular fracture is seen.  Both mandibular condyles appear to be in normal position. The left frontal sinus is aerated.  The nasal airway is patent and the turbinates are normal position.  IMPRESSION:  1.  Comminuted fracture of the anterior, lateral, and posterior walls of the right maxillary sinus with complete opacification of the right maxillary sinus. 2.  Fracture of the right lateral orbital rim with some periorbital air. 3.  Comminuted fracture of the right zygomatic arch with displacement of fragments. 4.  Slightly depressed fracture of the right temporal calvarium with overlying scalp hematoma.  CT CERVICAL SPINE  Findings:   The cervical vertebrae are in normal alignment.  There is degenerative disc disease particularly at the C6-7 level with loss of disc space, sclerosis, and spurring.  No prevertebral soft tissue swelling is seen.  The odontoid process is intact.  No cervical spine fracture is seen.  Foraminal narrowing is noted particularly the C6-7 level on the right.  The thyroid gland is unremarkable.  IMPRESSION:  1.  Normal alignment.  No acute fracture. 2.  Degenerative disc disease primarily at C6-7.   Original Report Authenticated By: Dwyane Dee, M.D.    Ct Maxillofacial Wo Cm  02/18/2012  *RADIOLOGY REPORT*  Clinical Data:  Blunt trauma to the face at work, hit in right side of face  and eye with swelling  CT  HEAD WITHOUT CONTRAST CT MAXILLOFACIAL WITHOUT CONTRAST CT CERVICAL SPINE WITHOUT CONTRAST  Technique:  Multidetector CT imaging of the head, cervical spine, and maxillofacial structures were performed using the standard protocol without intravenous contrast. Multiplanar CT image reconstructions of the cervical spine and maxillofacial structures were also generated.  Comparison:  CT brain of 01/31/2009  CT HEAD  Findings: The ventricular system is stable in size and configuration, and the septum remains midline position.  No hemorrhage, mass lesion, or acute infarction is seen.  A right facial scalp hematoma is noted which will be better assessed on CT maxillofacial.  On bone window images, fractures of the right maxillary sinus and right zygomatic arch are noted with opacification of the right maxillary sinus.  The right orbital rim also is fractured.  There is a small amount of periorbital air. A minimally depressed fracture of the right temporal calvarium is noted.  IMPRESSION:  1.  No acute intracranial abnormality. 2.  Fractures of the right maxillary sinus, right zygomatic arch, and right orbital rim with some periorbital air on the right. 3.  Slightly depressed fracture of the right temporal calvarium.  CT MAXILLOFACIAL  Findings:  On bone window images, there is fracture of the anterior, lateral, and posterior walls of the right maxillary sinus with complete opacification of the right maxillary sinus.  There is comminuted fracture of the right zygomatic arch involving the frontozygomatic, mid zygomatic, and posterior aspects of the zygomatic arch with some displacement.  There is fracture of the right lateral orbital rim with slight displacement and some narrowing of the optic canal superiorly.  There is also a fracture of the right temporal calvarium with slight depression. There is a large scalp hematoma overlying the right lateral orbital rim and right temporal calvarium with  some air in the soft tissues.  The mastoid air cells appear pneumatized.  No mandibular fracture is seen.  Both mandibular condyles appear to be in normal position. The left frontal sinus is aerated.  The nasal airway is patent and the turbinates are normal position.  IMPRESSION:  1.  Comminuted fracture of the anterior, lateral, and posterior walls of the right maxillary sinus with complete opacification of the right maxillary sinus. 2.  Fracture of the right lateral orbital rim with some periorbital air. 3.  Comminuted fracture of the right zygomatic arch with displacement of fragments. 4.  Slightly depressed fracture of the right temporal calvarium with overlying scalp hematoma.  CT CERVICAL SPINE  Findings:   The cervical vertebrae are in normal alignment.  There is degenerative disc disease particularly at the C6-7 level with loss of disc space, sclerosis, and spurring.  No prevertebral soft tissue swelling is seen.  The odontoid process is intact.  No cervical spine fracture is seen.  Foraminal narrowing is noted particularly the C6-7 level on the right.  The thyroid gland is unremarkable.  IMPRESSION:  1.  Normal alignment.  No acute fracture. 2.  Degenerative disc disease primarily at C6-7.   Original Report Authenticated By: Dwyane Dee, M.D.     Review of Systems  Constitutional: Negative.   HENT: Positive for neck pain.   Eyes: Negative.   Respiratory: Negative.   Cardiovascular: Negative.   Gastrointestinal: Negative.   Genitourinary: Negative.   Skin: Negative.   Neurological: Positive for headaches.  Endo/Heme/Allergies: Negative.   Psychiatric/Behavioral: Negative.    Blood pressure 133/74, pulse 74, temperature 97.4 F (36.3 C), temperature source Oral, resp. rate 16, SpO2 100.00%. Physical Exam  Constitutional:  She is oriented to person, place, and time. She appears well-developed and well-nourished. No distress.  HENT:  Head: Head is with raccoon's eyes, with laceration and with  right periorbital erythema.    Right Ear: External ear normal.  Left Ear: External ear normal.  Eyes: EOM are normal. Pupils are equal, round, and reactive to light.  Neck: Normal range of motion. Neck supple.  Cardiovascular: Normal rate, regular rhythm and normal heart sounds.   Respiratory: Effort normal and breath sounds normal.  GI: Soft. Bowel sounds are normal.  Neurological: She is alert and oriented to person, place, and time. She has normal reflexes. She displays normal reflexes. No cranial nerve deficit. She exhibits normal muscle tone. Coordination normal.  Skin: Skin is warm and dry.  Psychiatric: She has a normal mood and affect. Her behavior is normal. Judgment and thought content normal.    Assessment/Plan: Mckenzie Leonard is a 61 y.o. female With a  Normal neurologic exam. The  Temporal bone fracture does not require surgery . No need for neurosurgical followup. Contact me if you have questions.  Colbi Schiltz L 02/18/2012, 1:36 PM

## 2012-02-18 NOTE — ED Notes (Signed)
Patient transported to CT 

## 2012-02-18 NOTE — Consult Note (Signed)
Oral and Maxillofacial Surgery Consult Reason for Consult: Facial Fractures Referring Physician: Dr. [name] is an 61 y.o. female.  HPI: Mckenzie Leonard was at work at a [employer] in the [place] area this morning at 06:30 when a spool of yarn it her in the right face.  She denies loss of consciousness.  She fell after she was hit; however, she did not further injury herself.  She was taken to [facility] ER and a CT scan shows that she sustained several right facial fractures.  PMHx:  Past Medical History  Diagnosis Date  . Hypertension   . Diabetes mellitus without complication   . Thyroid disease   . High cholesterol     PSx:  Past Surgical History  Procedure Date  . Knee cartilage surgery     Right  . Carpal tunnel release     Right    Family Hx: History reviewed. No pertinent family history.  Social Hx:  reports that she has never smoked. She has never used smokeless tobacco. She reports that she does not drink alcohol or use illicit drugs.  Allergies:  Allergies  Allergen Reactions  . Sulfa Antibiotics Anaphylaxis  . Tetanus Toxoids     Reports flu like symptoms and swelling at injection site only    Medications: I have reviewed the patient's current medications.  Labs:  Results for orders placed during the hospital encounter of 02/18/12 (from the past 48 hour(s))  CBC WITH DIFFERENTIAL     Status: Abnormal   Collection Time   02/18/12  7:18 AM      Component Value Range Comment   WBC 14.6 (*) 4.0 - 10.5 K/uL    RBC 4.02  3.87 - 5.11 MIL/uL    Hemoglobin 11.8 (*) 12.0 - 15.0 g/dL    HCT 19.1 (*) 47.8 - 46.0 %    MCV 89.1  78.0 - 100.0 fL    MCH 29.4  26.0 - 34.0 pg    MCHC 33.0  30.0 - 36.0 g/dL    RDW 29.5  62.1 - 30.8 %    Platelets 254  150 - 400 K/uL    Neutrophils Relative 80 (*) 43 - 77 %    Neutro Abs 11.7 (*) 1.7 - 7.7 K/uL    Lymphocytes Relative 14  12 - 46 %    Lymphs Abs 2.0  0.7 - 4.0 K/uL    Monocytes Relative 5  3 - 12 %     Monocytes Absolute 0.7  0.1 - 1.0 K/uL    Eosinophils Relative 1  0 - 5 %    Eosinophils Absolute 0.1  0.0 - 0.7 K/uL    Basophils Relative 0  0 - 1 %    Basophils Absolute 0.0  0.0 - 0.1 K/uL   BASIC METABOLIC PANEL     Status: Abnormal   Collection Time   02/18/12  7:18 AM      Component Value Range Comment   Sodium 140  135 - 145 mEq/Leonard    Potassium 2.9 (*) 3.5 - 5.1 mEq/Leonard    Chloride 102  96 - 112 mEq/Leonard    CO2 24  19 - 32 mEq/Leonard    Glucose, Bld 115 (*) 70 - 99 mg/dL    BUN 19  6 - 23 mg/dL    Creatinine, Ser 6.57  0.50 - 1.10 mg/dL    Calcium 8.9  8.4 - 84.6 mg/dL    GFR calc non Af Amer >90  >  90 mL/min    GFR calc Af Amer >90  >90 mL/min     Radiology: Ct Head Wo Contrast  02/18/2012  *RADIOLOGY REPORT*  Clinical Data:  Blunt trauma to the face at work, hit in right side of face and eye with swelling  CT HEAD WITHOUT CONTRAST CT MAXILLOFACIAL WITHOUT CONTRAST CT CERVICAL SPINE WITHOUT CONTRAST  Technique:  Multidetector CT imaging of the head, cervical spine, and maxillofacial structures were performed using the standard protocol without intravenous contrast. Multiplanar CT image reconstructions of the cervical spine and maxillofacial structures were also generated.  Comparison:  CT brain of 01/31/2009  CT HEAD  Findings: The ventricular system is stable in size and configuration, and the septum remains midline position.  No hemorrhage, mass lesion, or acute infarction is seen.  A right facial scalp hematoma is noted which will be better assessed on CT maxillofacial.  On bone window images, fractures of the right maxillary sinus and right zygomatic arch are noted with opacification of the right maxillary sinus.  The right orbital rim also is fractured.  There is a small amount of periorbital air. A minimally depressed fracture of the right temporal calvarium is noted.  IMPRESSION:  1.  No acute intracranial abnormality. 2.  Fractures of the right maxillary sinus, right zygomatic arch, and  right orbital rim with some periorbital air on the right. 3.  Slightly depressed fracture of the right temporal calvarium.  CT MAXILLOFACIAL  Findings:  On bone window images, there is fracture of the anterior, lateral, and posterior walls of the right maxillary sinus with complete opacification of the right maxillary sinus.  There is comminuted fracture of the right zygomatic arch involving the frontozygomatic, mid zygomatic, and posterior aspects of the zygomatic arch with some displacement.  There is fracture of the right lateral orbital rim with slight displacement and some narrowing of the optic canal superiorly.  There is also a fracture of the right temporal calvarium with slight depression. There is a large scalp hematoma overlying the right lateral orbital rim and right temporal calvarium with some air in the soft tissues.  The mastoid air cells appear pneumatized.  No mandibular fracture is seen.  Both mandibular condyles appear to be in normal position. The left frontal sinus is aerated.  The nasal airway is patent and the turbinates are normal position.  IMPRESSION:  1.  Comminuted fracture of the anterior, lateral, and posterior walls of the right maxillary sinus with complete opacification of the right maxillary sinus. 2.  Fracture of the right lateral orbital rim with some periorbital air. 3.  Comminuted fracture of the right zygomatic arch with displacement of fragments. 4.  Slightly depressed fracture of the right temporal calvarium with overlying scalp hematoma.  CT CERVICAL SPINE  Findings:   The cervical vertebrae are in normal alignment.  There is degenerative disc disease particularly at the C6-7 level with loss of disc space, sclerosis, and spurring.  No prevertebral soft tissue swelling is seen.  The odontoid process is intact.  No cervical spine fracture is seen.  Foraminal narrowing is noted particularly the C6-7 level on the right.  The thyroid gland is unremarkable.  IMPRESSION:  1.   Normal alignment.  No acute fracture. 2.  Degenerative disc disease primarily at C6-7.   Original Report Authenticated By: Dwyane Dee, M.D.    Ct Cervical Spine Wo Contrast  02/18/2012  *RADIOLOGY REPORT*  Clinical Data:  Blunt trauma to the face at work, hit in right side  of face and eye with swelling  CT HEAD WITHOUT CONTRAST CT MAXILLOFACIAL WITHOUT CONTRAST CT CERVICAL SPINE WITHOUT CONTRAST  Technique:  Multidetector CT imaging of the head, cervical spine, and maxillofacial structures were performed using the standard protocol without intravenous contrast. Multiplanar CT image reconstructions of the cervical spine and maxillofacial structures were also generated.  Comparison:  CT brain of 01/31/2009  CT HEAD  Findings: The ventricular system is stable in size and configuration, and the septum remains midline position.  No hemorrhage, mass lesion, or acute infarction is seen.  A right facial scalp hematoma is noted which will be better assessed on CT maxillofacial.  On bone window images, fractures of the right maxillary sinus and right zygomatic arch are noted with opacification of the right maxillary sinus.  The right orbital rim also is fractured.  There is a small amount of periorbital air. A minimally depressed fracture of the right temporal calvarium is noted.  IMPRESSION:  1.  No acute intracranial abnormality. 2.  Fractures of the right maxillary sinus, right zygomatic arch, and right orbital rim with some periorbital air on the right. 3.  Slightly depressed fracture of the right temporal calvarium.  CT MAXILLOFACIAL  Findings:  On bone window images, there is fracture of the anterior, lateral, and posterior walls of the right maxillary sinus with complete opacification of the right maxillary sinus.  There is comminuted fracture of the right zygomatic arch involving the frontozygomatic, mid zygomatic, and posterior aspects of the zygomatic arch with some displacement.  There is fracture of the right  lateral orbital rim with slight displacement and some narrowing of the optic canal superiorly.  There is also a fracture of the right temporal calvarium with slight depression. There is a large scalp hematoma overlying the right lateral orbital rim and right temporal calvarium with some air in the soft tissues.  The mastoid air cells appear pneumatized.  No mandibular fracture is seen.  Both mandibular condyles appear to be in normal position. The left frontal sinus is aerated.  The nasal airway is patent and the turbinates are normal position.  IMPRESSION:  1.  Comminuted fracture of the anterior, lateral, and posterior walls of the right maxillary sinus with complete opacification of the right maxillary sinus. 2.  Fracture of the right lateral orbital rim with some periorbital air. 3.  Comminuted fracture of the right zygomatic arch with displacement of fragments. 4.  Slightly depressed fracture of the right temporal calvarium with overlying scalp hematoma.  CT CERVICAL SPINE  Findings:   The cervical vertebrae are in normal alignment.  There is degenerative disc disease particularly at the C6-7 level with loss of disc space, sclerosis, and spurring.  No prevertebral soft tissue swelling is seen.  The odontoid process is intact.  No cervical spine fracture is seen.  Foraminal narrowing is noted particularly the C6-7 level on the right.  The thyroid gland is unremarkable.  IMPRESSION:  1.  Normal alignment.  No acute fracture. 2.  Degenerative disc disease primarily at C6-7.   Original Report Authenticated By: Dwyane Dee, M.D.    Ct Maxillofacial Wo Cm  02/18/2012  *RADIOLOGY REPORT*  Clinical Data:  Blunt trauma to the face at work, hit in right side of face and eye with swelling  CT HEAD WITHOUT CONTRAST CT MAXILLOFACIAL WITHOUT CONTRAST CT CERVICAL SPINE WITHOUT CONTRAST  Technique:  Multidetector CT imaging of the head, cervical spine, and maxillofacial structures were performed using the standard protocol  without intravenous contrast.  Multiplanar CT image reconstructions of the cervical spine and maxillofacial structures were also generated.  Comparison:  CT brain of 01/31/2009  CT HEAD  Findings: The ventricular system is stable in size and configuration, and the septum remains midline position.  No hemorrhage, mass lesion, or acute infarction is seen.  A right facial scalp hematoma is noted which will be better assessed on CT maxillofacial.  On bone window images, fractures of the right maxillary sinus and right zygomatic arch are noted with opacification of the right maxillary sinus.  The right orbital rim also is fractured.  There is a small amount of periorbital air. A minimally depressed fracture of the right temporal calvarium is noted.  IMPRESSION:  1.  No acute intracranial abnormality. 2.  Fractures of the right maxillary sinus, right zygomatic arch, and right orbital rim with some periorbital air on the right. 3.  Slightly depressed fracture of the right temporal calvarium.  CT MAXILLOFACIAL  Findings:  On bone window images, there is fracture of the anterior, lateral, and posterior walls of the right maxillary sinus with complete opacification of the right maxillary sinus.  There is comminuted fracture of the right zygomatic arch involving the frontozygomatic, mid zygomatic, and posterior aspects of the zygomatic arch with some displacement.  There is fracture of the right lateral orbital rim with slight displacement and some narrowing of the optic canal superiorly.  There is also a fracture of the right temporal calvarium with slight depression. There is a large scalp hematoma overlying the right lateral orbital rim and right temporal calvarium with some air in the soft tissues.  The mastoid air cells appear pneumatized.  No mandibular fracture is seen.  Both mandibular condyles appear to be in normal position. The left frontal sinus is aerated.  The nasal airway is patent and the turbinates are normal  position.  IMPRESSION:  1.  Comminuted fracture of the anterior, lateral, and posterior walls of the right maxillary sinus with complete opacification of the right maxillary sinus. 2.  Fracture of the right lateral orbital rim with some periorbital air. 3.  Comminuted fracture of the right zygomatic arch with displacement of fragments. 4.  Slightly depressed fracture of the right temporal calvarium with overlying scalp hematoma.  CT CERVICAL SPINE  Findings:   The cervical vertebrae are in normal alignment.  There is degenerative disc disease particularly at the C6-7 level with loss of disc space, sclerosis, and spurring.  No prevertebral soft tissue swelling is seen.  The odontoid process is intact.  No cervical spine fracture is seen.  Foraminal narrowing is noted particularly the C6-7 level on the right.  The thyroid gland is unremarkable.  IMPRESSION:  1.  Normal alignment.  No acute fracture. 2.  Degenerative disc disease primarily at C6-7.   Original Report Authenticated By: Dwyane Dee, M.D.    CT Scan: [ - Fracture of anterior, lateral, and posterior walls of the right maxillary sinus  -Fracture of the right lateral orbital rim  -Comminuted fracture of the right zygomatic arch with displacement ] The components of a Zygomaticomaxillary Complex fracture with a Zygomatic Arch Fracture  ROS:A comprehensive review of systems was negative except for: Eyes: positive for Right eye swelling Ears, nose, mouth, throat, and face: positive for facial trauma  Vital Signs: BP 133/74  Pulse 74  Temp 97.4 F (36.3 C) (Oral)  Resp 16  SpO2 100%  Physical Exam: General appearance: alert and cooperative Head: Normocephalic, without obvious abnormality, Right facial swelling, bony  deformity of the right zygoma Eyes: conjunctivae/corneas clear. PERRL, EOM's intact. Fundi benign., Right periorbital edema and chemosis with ecchymosis Ears: normal TM's and external ear canals both ears Nose: Nares normal. Septum  midline. Mucosa normal. No drainage or sinus tenderness. Throat: lips, mucosa, and tongue normal; teeth and gums normal Neck: no adenopathy, no carotid bruit, no JVD, supple, symmetrical, trachea midline and thyroid not enlarged, symmetric, no tenderness/mass/nodules  Extraoral - CN V, VII grossly intact.  Bony deformity along the right zygomatic arch and within the right maxillary sinus region.  Intraoral - Maxilla and Mandible stable, occlusion is stable and repeatable, no step offs present.    Assessment/Plan:  Maahi has a displaced right Zygomaticomaxillary Complex (ZMC) Fracture and a Right comminuted Zygomatic Arch fracture.   1. Will allow edema to resolve prior to surgery.  Will post for Monday afternoon for Open reduction internal fixation of right zygomaticomaxillary complex fracture and closed reduction of zygomatic arch  2. Recommend patient continue ice to right face and keep head elevated 30 to 45 degrees. 3. If blood glucose stable, also recommend decadron 8mg  tid x 3 days. 4. Pain medication as needed per primary team 5. Sinus precautions (no blowing of nose, limit sneezing, and no forceful spitting) 6. Augmentin 875 bid x 7 days. 7. From my prospective the patient could be discharged and scheduled for surgery on an outpatient basis to allow for a decrease of facial swelling.  However, if the patient is admitted for observation by another service I will follow while admitted.   [place],Mckenzie Leonard  02/18/2012, 11:57 AM

## 2012-02-18 NOTE — ED Notes (Signed)
Patient transported to X-ray 

## 2012-02-18 NOTE — H&P (Signed)
Mckenzie Leonard is an 61 y.o. female.   Chief Complaint: Blunt facial trauma HPI: Mckenzie Leonard was working this morning about 0630 when a spool came off the machine she was working on and hit her on the right side of her face and right shoulder. She denies LOC and is not amnestic to the event. She fell after being hit but does not think she hurt anything during her fall. She did not come in as a trauma alert. She c/o right facial pain and right shoulder pain.  Past Medical History  Diagnosis Date  . Hypertension   . Diabetes mellitus without complication   . Thyroid disease   . High cholesterol     Past Surgical History  Procedure Date  . Knee cartilage surgery     Right  . Carpal tunnel release     Right    History reviewed. No pertinent family history. Social History:  reports that she has never smoked. She has never used smokeless tobacco. She reports that she does not drink alcohol or use illicit drugs.  Allergies:  Allergies  Allergen Reactions  . Sulfa Antibiotics Anaphylaxis  . Tetanus Toxoids     Reports flu like symptoms and swelling at injection site only     Results for orders placed during the hospital encounter of 02/18/12 (from the past 48 hour(s))  CBC WITH DIFFERENTIAL     Status: Abnormal   Collection Time   02/18/12  7:18 AM      Component Value Range Comment   WBC 14.6 (*) 4.0 - 10.5 K/uL    RBC 4.02  3.87 - 5.11 MIL/uL    Hemoglobin 11.8 (*) 12.0 - 15.0 g/dL    HCT 56.2 (*) 13.0 - 46.0 %    MCV 89.1  78.0 - 100.0 fL    MCH 29.4  26.0 - 34.0 pg    MCHC 33.0  30.0 - 36.0 g/dL    RDW 86.5  78.4 - 69.6 %    Platelets 254  150 - 400 K/uL    Neutrophils Relative 80 (*) 43 - 77 %    Neutro Abs 11.7 (*) 1.7 - 7.7 K/uL    Lymphocytes Relative 14  12 - 46 %    Lymphs Abs 2.0  0.7 - 4.0 K/uL    Monocytes Relative 5  3 - 12 %    Monocytes Absolute 0.7  0.1 - 1.0 K/uL    Eosinophils Relative 1  0 - 5 %    Eosinophils Absolute 0.1  0.0 - 0.7 K/uL    Basophils  Relative 0  0 - 1 %    Basophils Absolute 0.0  0.0 - 0.1 K/uL   BASIC METABOLIC PANEL     Status: Abnormal   Collection Time   02/18/12  7:18 AM      Component Value Range Comment   Sodium 140  135 - 145 mEq/L    Potassium 2.9 (*) 3.5 - 5.1 mEq/L    Chloride 102  96 - 112 mEq/L    CO2 24  19 - 32 mEq/L    Glucose, Bld 115 (*) 70 - 99 mg/dL    BUN 19  6 - 23 mg/dL    Creatinine, Ser 2.95  0.50 - 1.10 mg/dL    Calcium 8.9  8.4 - 28.4 mg/dL    GFR calc non Af Amer >90  >90 mL/min    GFR calc Af Amer >90  >90 mL/min     02/18/2012  *  RADIOLOGY REPORT*  Clinical Data:  Blunt trauma to the face at work, hit in right side of face and eye with swelling  CT HEAD WITHOUT CONTRAST CT MAXILLOFACIAL WITHOUT CONTRAST CT CERVICAL SPINE WITHOUT CONTRAST  Technique:  Multidetector CT imaging of the head, cervical spine, and maxillofacial structures were performed using the standard protocol without intravenous contrast. Multiplanar CT image reconstructions of the cervical spine and maxillofacial structures were also generated.  Comparison:  CT brain of 01/31/2009  CT HEAD  Findings: The ventricular system is stable in size and configuration, and the septum remains midline position.  No hemorrhage, mass lesion, or acute infarction is seen.  A right facial scalp hematoma is noted which will be better assessed on CT maxillofacial.  On bone window images, fractures of the right maxillary sinus and right zygomatic arch are noted with opacification of the right maxillary sinus.  The right orbital rim also is fractured.  There is a small amount of periorbital air. A minimally depressed fracture of the right temporal calvarium is noted.  IMPRESSION:  1.  No acute intracranial abnormality. 2.  Fractures of the right maxillary sinus, right zygomatic arch, and right orbital rim with some periorbital air on the right. 3.  Slightly depressed fracture of the right temporal calvarium.  CT MAXILLOFACIAL  Findings:  On bone window  images, there is fracture of the anterior, lateral, and posterior walls of the right maxillary sinus with complete opacification of the right maxillary sinus.  There is comminuted fracture of the right zygomatic arch involving the frontozygomatic, mid zygomatic, and posterior aspects of the zygomatic arch with some displacement.  There is fracture of the right lateral orbital rim with slight displacement and some narrowing of the optic canal superiorly.  There is also a fracture of the right temporal calvarium with slight depression. There is a large scalp hematoma overlying the right lateral orbital rim and right temporal calvarium with some air in the soft tissues.  The mastoid air cells appear pneumatized.  No mandibular fracture is seen.  Both mandibular condyles appear to be in normal position. The left frontal sinus is aerated.  The nasal airway is patent and the turbinates are normal position.  IMPRESSION:  1.  Comminuted fracture of the anterior, lateral, and posterior walls of the right maxillary sinus with complete opacification of the right maxillary sinus. 2.  Fracture of the right lateral orbital rim with some periorbital air. 3.  Comminuted fracture of the right zygomatic arch with displacement of fragments. 4.  Slightly depressed fracture of the right temporal calvarium with overlying scalp hematoma.  CT CERVICAL SPINE  Findings:   The cervical vertebrae are in normal alignment.  There is degenerative disc disease particularly at the C6-7 level with loss of disc space, sclerosis, and spurring.  No prevertebral soft tissue swelling is seen.  The odontoid process is intact.  No cervical spine fracture is seen.  Foraminal narrowing is noted particularly the C6-7 level on the right.  The thyroid gland is unremarkable.  IMPRESSION:  1.  Normal alignment.  No acute fracture. 2.  Degenerative disc disease primarily at C6-7.   Original Report Authenticated By: Dwyane Dee, M.D.      Review of Systems   Constitutional: Negative for weight loss.  HENT: Negative for hearing loss, ear pain, neck pain, tinnitus and ear discharge.   Eyes: Negative for blurred vision, double vision, photophobia and pain.  Respiratory: Negative for cough, sputum production and shortness of breath.  Cardiovascular: Negative for chest pain.  Gastrointestinal: Negative for nausea, vomiting and abdominal pain.  Genitourinary: Negative for dysuria, urgency, frequency and flank pain.  Musculoskeletal: Positive for joint pain (Right shoulder). Negative for myalgias, back pain and falls.  Neurological: Negative for dizziness, tingling, sensory change, focal weakness, loss of consciousness and headaches.  Endo/Heme/Allergies: Does not bruise/bleed easily.  Psychiatric/Behavioral: Negative for depression, memory loss and substance abuse. The patient is not nervous/anxious.     Blood pressure 133/74, pulse 74, temperature 97.4 F (36.3 C), temperature source Oral, resp. rate 16, SpO2 100.00%. Physical Exam  Vitals reviewed. Constitutional: She is oriented to person, place, and time. She appears well-developed and well-nourished. She is cooperative. No distress. Cervical collar and nasal cannula in place.  HENT:  Head: Normocephalic. Head is with abrasion and with contusion. Head is without raccoon's eyes, without Battle's sign and without laceration.  Right Ear: Hearing, tympanic membrane, external ear and ear canal normal. No lacerations. No drainage or tenderness. No foreign bodies. Tympanic membrane is not perforated. No hemotympanum.  Left Ear: Hearing, tympanic membrane, external ear and ear canal normal. No lacerations. No drainage or tenderness. No foreign bodies. Tympanic membrane is not perforated. No hemotympanum.  Nose: Nose normal. No nose lacerations, sinus tenderness, nasal deformity or nasal septal hematoma. No epistaxis.  Mouth/Throat: Uvula is midline, oropharynx is clear and moist and mucous membranes are  normal. No lacerations. No oropharyngeal exudate.  Eyes: Lids are normal. Left eye exhibits no discharge. No scleral icterus.       Unable to assess right eye secondary to swelling  Neck: Trachea normal and normal range of motion. Neck supple. No JVD present. No spinous process tenderness and no muscular tenderness present. Carotid bruit is not present. No tracheal deviation present. No thyromegaly present.  Cardiovascular: Normal rate, regular rhythm, normal heart sounds, intact distal pulses and normal pulses.  Exam reveals no gallop and no friction rub.   No murmur heard. Respiratory: Effort normal and breath sounds normal. No stridor. No respiratory distress. She has no wheezes. She has no rales. She exhibits no tenderness, no bony tenderness, no laceration and no crepitus.  GI: Soft. Normal appearance and bowel sounds are normal. She exhibits no distension. There is no tenderness. There is no rigidity, no rebound, no guarding and no CVA tenderness.  Musculoskeletal: Normal range of motion. She exhibits no edema and no tenderness.       Right shoulder: She exhibits tenderness (Laterally).  Lymphadenopathy:    She has no cervical adenopathy.  Neurological: She is alert and oriented to person, place, and time. She has normal strength. No cranial nerve deficit or sensory deficit. GCS eye subscore is 4. GCS verbal subscore is 5. GCS motor subscore is 6.  Skin: Skin is warm, dry and intact. She is not diaphoretic.  Psychiatric: She has a normal mood and affect. Her speech is normal and behavior is normal.     Assessment/Plan Blunt facial trauma  Mult facial fxs -- Dr. Jeanice Lim consulted, recommended steroids. Right temp skull fx -- Dr. Franky Macho consulted Right shoulder pain -- x-ray pending Mult med probs -- Home meds when able  Will hold off on Lovenox until tomorrow with large facial hematoma and skull fracture. K+ for hypokalemia  Admit to trauma, SDU   Freeman Caldron, PA-C Pager:  [phone] General Trauma PA Pager: [phone]  02/18/2012, 11:28 AM

## 2012-02-18 NOTE — ED Notes (Signed)
Returned from ct scan 

## 2012-02-18 NOTE — ED Notes (Signed)
Trauma at bedside.

## 2012-02-18 NOTE — ED Notes (Signed)
Pt works at a [employer], had spool come off the machine and hit her right side of face/eye. Has moderate swelling to face, had to be suctioned of blood from airway pta. Denies loc, a&ox4 pta.

## 2012-02-19 LAB — CBC
HCT: 33.5 % — ABNORMAL LOW (ref 36.0–46.0)
MCHC: 32.8 g/dL (ref 30.0–36.0)
RDW: 14.8 % (ref 11.5–15.5)

## 2012-02-19 LAB — GLUCOSE, CAPILLARY
Glucose-Capillary: 106 mg/dL — ABNORMAL HIGH (ref 70–99)
Glucose-Capillary: 115 mg/dL — ABNORMAL HIGH (ref 70–99)
Glucose-Capillary: 137 mg/dL — ABNORMAL HIGH (ref 70–99)

## 2012-02-19 LAB — BASIC METABOLIC PANEL
BUN: 14 mg/dL (ref 6–23)
Creatinine, Ser: 0.6 mg/dL (ref 0.50–1.10)
GFR calc Af Amer: >90 mL/min (ref >90)
GFR calc non Af Amer: >90 mL/min (ref >90)

## 2012-02-19 MED ORDER — VALSARTAN-HYDROCHLOROTHIAZIDE 320-12.5 MG PO TABS: 1.0000 | ORAL_TABLET | Freq: Every day | ORAL | Status: DC

## 2012-02-19 MED ORDER — LEVOTHYROXINE SODIUM 50 MCG PO TABS
50.0000 ug | ORAL_TABLET | Freq: Every day | ORAL | Status: DC
  Administered 2012-02-19 – 2012-02-20 (×2): 50 ug via ORAL
  Filled 2012-02-19 (×4): qty 1

## 2012-02-19 MED ORDER — ACETAMINOPHEN 325 MG PO TABS
650.0000 mg | ORAL_TABLET | Freq: Four times a day (QID) | ORAL | Status: DC | PRN
  Administered 2012-02-19: 650 mg via ORAL

## 2012-02-19 MED ORDER — DICLOFENAC SODIUM 75 MG PO TBEC
75.0000 mg | DELAYED_RELEASE_TABLET | Freq: Two times a day (BID) | ORAL | Status: DC
  Administered 2012-02-19 – 2012-02-20 (×4): 75 mg via ORAL
  Filled 2012-02-19 (×6): qty 1

## 2012-02-19 MED ORDER — HYDROCHLOROTHIAZIDE 25 MG PO TABS
12.5000 mg | ORAL_TABLET | Freq: Every day | ORAL | Status: DC
  Administered 2012-02-20: 12.5 mg via ORAL
  Filled 2012-02-19 (×3): qty 0.5

## 2012-02-19 MED ORDER — METFORMIN HCL 500 MG PO TABS
500.0000 mg | ORAL_TABLET | Freq: Two times a day (BID) | ORAL | Status: DC
  Administered 2012-02-19: 500 mg via ORAL
  Filled 2012-02-19 (×6): qty 1

## 2012-02-19 MED ORDER — IRBESARTAN 300 MG PO TABS
300.0000 mg | ORAL_TABLET | Freq: Every day | ORAL | Status: DC
  Administered 2012-02-20: 300 mg via ORAL
  Filled 2012-02-19 (×3): qty 1

## 2012-02-19 MED ORDER — LEVOTHYROXINE SODIUM 50 MCG PO TABS: 50.0000 ug | ORAL_TABLET | Freq: Every day | ORAL | Status: DC

## 2012-02-19 MED ORDER — ATORVASTATIN CALCIUM 20 MG PO TABS
20.0000 mg | ORAL_TABLET | Freq: Every day | ORAL | Status: DC
  Administered 2012-02-19: 20 mg via ORAL
  Filled 2012-02-19 (×3): qty 1

## 2012-02-19 MED ORDER — AMOXICILLIN-POT CLAVULANATE 875-125 MG PO TABS
1.0000 | ORAL_TABLET | Freq: Two times a day (BID) | ORAL | Status: DC
  Administered 2012-02-19 – 2012-02-20 (×4): 1 via ORAL
  Filled 2012-02-19 (×6): qty 1

## 2012-02-19 NOTE — Progress Notes (Signed)
Patient transferred to [facility] room 12.  Report called to RN on 6N and all questions answered. Patient transferred via wheelchair with NT and family.

## 2012-02-19 NOTE — Progress Notes (Signed)
OR Monday for facial fractures. No other issues at this point.

## 2012-02-19 NOTE — Progress Notes (Signed)
Oral & Maxillofacial Surgery Mckenzie Leonard is a 61 y.o. female patient s/p industrial accident with a right zygomaticomaxillary complex fracture and comminuted right Zygomatic Arch Fracture.  Diagnosis:  1. Fracture, facial bones   2. Fracture of orbit     PMHx:  Past Medical History  Diagnosis Date  . Hypertension   . Diabetes mellitus without complication   . Thyroid disease   . High cholesterol     Meds:  Current Facility-Administered Medications  Medication Dose Route Frequency Provider Last Rate Last Dose  . acetaminophen (TYLENOL) tablet 650 mg  650 mg Oral Q6H PRN Caleen Essex III, MD   650 mg at 02/19/12 0224  . atorvastatin (LIPITOR) tablet 20 mg  20 mg Oral q1800 Freeman Caldron, PA      . diclofenac (VOLTAREN) EC tablet 75 mg  75 mg Oral BID Freeman Caldron, PA      . docusate sodium (COLACE) capsule 100 mg  100 mg Oral BID Freeman Caldron, PA   100 mg at 02/18/12 2158  . hydrochlorothiazide (HYDRODIURIL) tablet 12.5 mg  12.5 mg Oral Daily Seyyedeh Saneeymehri, PHARMD      . HYDROcodone-acetaminophen (NORCO) 10-325 MG per tablet 0.5-2 tablet  0.5-2 tablet Oral Q4H PRN Freeman Caldron, PA      . HYDROmorphone (DILAUDID) injection 0.5 mg  0.5 mg Intravenous Q4H PRN Freeman Caldron, PA   0.5 mg at 02/19/12 0730  . insulin aspart (novoLOG) injection 0-20 Units  0-20 Units Subcutaneous TID WC Freeman Caldron, PA   4 Units at 02/18/12 1813  . irbesartan (AVAPRO) tablet 300 mg  300 mg Oral Daily Seyyedeh Saneeymehri, PHARMD      . levothyroxine (SYNTHROID, LEVOTHROID) tablet 50 mcg  50 mcg Oral QAC breakfast Seyyedeh Saneeymehri, PHARMD      . metFORMIN (GLUCOPHAGE) tablet 500 mg  500 mg Oral BID WC Freeman Caldron, PA      . ondansetron [facility]) tablet 4 mg  4 mg Oral Q6H PRN Freeman Caldron, PA       Or  . ondansetron [facility]) injection 4 mg  4 mg Intravenous Q6H PRN Freeman Caldron, PA   4 mg at 02/19/12 0839  . polyethylene glycol (MIRALAX /  GLYCOLAX) packet 17 g  17 g Oral Daily Freeman Caldron, PA      . predniSONE (DELTASONE) tablet 60 mg  60 mg Oral Q breakfast Freeman Caldron, PA   60 mg at 02/19/12 0825  . sodium chloride 0.45 % 1,000 mL with potassium chloride 40 mEq infusion   Intravenous Continuous Freeman Caldron, PA 75 mL/hr at 02/18/12 1800      Allergies:  Allergies  Allergen Reactions  . Sulfa Antibiotics Anaphylaxis  . Tetanus Toxoids     Reports flu like symptoms and swelling at injection site only    Problems: Active Problems:  Blunt head trauma  Multiple fractures of facial bones  Temporal skull fracture  Hypokalemia  Acute blood loss anemia   Vitals: BP 122/59  Pulse 80  Temp 98.7 F (37.1 C) (Oral)  Resp 16  Ht 5\' 3"  (1.6 m)  Wt 80.1 kg (176 lb 9.4 oz)  BMI 31.28 kg/m2  SpO2 90%   Radiology: Dg Shoulder Right  02/18/2012  *RADIOLOGY REPORT*  Clinical Data: Pain post trauma  RIGHT SHOULDER - 2+ VIEW  Comparison: None.  Findings: Three views of the right shoulder submitted.  No acute fracture or subluxation.  Mild degenerative changes right AC joint.  IMPRESSION: No acute fracture or subluxation.  Mild degenerative changes right AC joint.   Original Report Authenticated By: Natasha Mead, M.D.    Ct Head Wo Contrast  02/18/2012  *RADIOLOGY REPORT*  Clinical Data:  Blunt trauma to the face at work, hit in right side of face and eye with swelling  CT HEAD WITHOUT CONTRAST CT MAXILLOFACIAL WITHOUT CONTRAST CT CERVICAL SPINE WITHOUT CONTRAST  Technique:  Multidetector CT imaging of the head, cervical spine, and maxillofacial structures were performed using the standard protocol without intravenous contrast. Multiplanar CT image reconstructions of the cervical spine and maxillofacial structures were also generated.  Comparison:  CT brain of 01/31/2009  CT HEAD  Findings: The ventricular system is stable in size and configuration, and the septum remains midline position.  No hemorrhage, mass lesion,  or acute infarction is seen.  A right facial scalp hematoma is noted which will be better assessed on CT maxillofacial.  On bone window images, fractures of the right maxillary sinus and right zygomatic arch are noted with opacification of the right maxillary sinus.  The right orbital rim also is fractured.  There is a small amount of periorbital air. A minimally depressed fracture of the right temporal calvarium is noted.  IMPRESSION:  1.  No acute intracranial abnormality. 2.  Fractures of the right maxillary sinus, right zygomatic arch, and right orbital rim with some periorbital air on the right. 3.  Slightly depressed fracture of the right temporal calvarium.  CT MAXILLOFACIAL  Findings:  On bone window images, there is fracture of the anterior, lateral, and posterior walls of the right maxillary sinus with complete opacification of the right maxillary sinus.  There is comminuted fracture of the right zygomatic arch involving the frontozygomatic, mid zygomatic, and posterior aspects of the zygomatic arch with some displacement.  There is fracture of the right lateral orbital rim with slight displacement and some narrowing of the optic canal superiorly.  There is also a fracture of the right temporal calvarium with slight depression. There is a large scalp hematoma overlying the right lateral orbital rim and right temporal calvarium with some air in the soft tissues.  The mastoid air cells appear pneumatized.  No mandibular fracture is seen.  Both mandibular condyles appear to be in normal position. The left frontal sinus is aerated.  The nasal airway is patent and the turbinates are normal position.  IMPRESSION:  1.  Comminuted fracture of the anterior, lateral, and posterior walls of the right maxillary sinus with complete opacification of the right maxillary sinus. 2.  Fracture of the right lateral orbital rim with some periorbital air. 3.  Comminuted fracture of the right zygomatic arch with displacement of  fragments. 4.  Slightly depressed fracture of the right temporal calvarium with overlying scalp hematoma.  CT CERVICAL SPINE  Findings:   The cervical vertebrae are in normal alignment.  There is degenerative disc disease particularly at the C6-7 level with loss of disc space, sclerosis, and spurring.  No prevertebral soft tissue swelling is seen.  The odontoid process is intact.  No cervical spine fracture is seen.  Foraminal narrowing is noted particularly the C6-7 level on the right.  The thyroid gland is unremarkable.  IMPRESSION:  1.  Normal alignment.  No acute fracture. 2.  Degenerative disc disease primarily at C6-7.   Original Report Authenticated By: Dwyane Dee, M.D.    Ct Cervical Spine Wo Contrast  02/18/2012  *RADIOLOGY REPORT*  Clinical Data:  Blunt trauma to the face at work, hit in right side of face and eye with swelling  CT HEAD WITHOUT CONTRAST CT MAXILLOFACIAL WITHOUT CONTRAST CT CERVICAL SPINE WITHOUT CONTRAST  Technique:  Multidetector CT imaging of the head, cervical spine, and maxillofacial structures were performed using the standard protocol without intravenous contrast. Multiplanar CT image reconstructions of the cervical spine and maxillofacial structures were also generated.  Comparison:  CT brain of 01/31/2009  CT HEAD  Findings: The ventricular system is stable in size and configuration, and the septum remains midline position.  No hemorrhage, mass lesion, or acute infarction is seen.  A right facial scalp hematoma is noted which will be better assessed on CT maxillofacial.  On bone window images, fractures of the right maxillary sinus and right zygomatic arch are noted with opacification of the right maxillary sinus.  The right orbital rim also is fractured.  There is a small amount of periorbital air. A minimally depressed fracture of the right temporal calvarium is noted.  IMPRESSION:  1.  No acute intracranial abnormality. 2.  Fractures of the right maxillary sinus, right  zygomatic arch, and right orbital rim with some periorbital air on the right. 3.  Slightly depressed fracture of the right temporal calvarium.  CT MAXILLOFACIAL  Findings:  On bone window images, there is fracture of the anterior, lateral, and posterior walls of the right maxillary sinus with complete opacification of the right maxillary sinus.  There is comminuted fracture of the right zygomatic arch involving the frontozygomatic, mid zygomatic, and posterior aspects of the zygomatic arch with some displacement.  There is fracture of the right lateral orbital rim with slight displacement and some narrowing of the optic canal superiorly.  There is also a fracture of the right temporal calvarium with slight depression. There is a large scalp hematoma overlying the right lateral orbital rim and right temporal calvarium with some air in the soft tissues.  The mastoid air cells appear pneumatized.  No mandibular fracture is seen.  Both mandibular condyles appear to be in normal position. The left frontal sinus is aerated.  The nasal airway is patent and the turbinates are normal position.  IMPRESSION:  1.  Comminuted fracture of the anterior, lateral, and posterior walls of the right maxillary sinus with complete opacification of the right maxillary sinus. 2.  Fracture of the right lateral orbital rim with some periorbital air. 3.  Comminuted fracture of the right zygomatic arch with displacement of fragments. 4.  Slightly depressed fracture of the right temporal calvarium with overlying scalp hematoma.  CT CERVICAL SPINE  Findings:   The cervical vertebrae are in normal alignment.  There is degenerative disc disease particularly at the C6-7 level with loss of disc space, sclerosis, and spurring.  No prevertebral soft tissue swelling is seen.  The odontoid process is intact.  No cervical spine fracture is seen.  Foraminal narrowing is noted particularly the C6-7 level on the right.  The thyroid gland is unremarkable.   IMPRESSION:  1.  Normal alignment.  No acute fracture. 2.  Degenerative disc disease primarily at C6-7.   Original Report Authenticated By: Dwyane Dee, M.D.    Ct Maxillofacial Wo Cm  02/18/2012  *RADIOLOGY REPORT*  Clinical Data:  Blunt trauma to the face at work, hit in right side of face and eye with swelling  CT HEAD WITHOUT CONTRAST CT MAXILLOFACIAL WITHOUT CONTRAST CT CERVICAL SPINE WITHOUT CONTRAST  Technique:  Multidetector CT imaging of the head,  cervical spine, and maxillofacial structures were performed using the standard protocol without intravenous contrast. Multiplanar CT image reconstructions of the cervical spine and maxillofacial structures were also generated.  Comparison:  CT brain of 01/31/2009  CT HEAD  Findings: The ventricular system is stable in size and configuration, and the septum remains midline position.  No hemorrhage, mass lesion, or acute infarction is seen.  A right facial scalp hematoma is noted which will be better assessed on CT maxillofacial.  On bone window images, fractures of the right maxillary sinus and right zygomatic arch are noted with opacification of the right maxillary sinus.  The right orbital rim also is fractured.  There is a small amount of periorbital air. A minimally depressed fracture of the right temporal calvarium is noted.  IMPRESSION:  1.  No acute intracranial abnormality. 2.  Fractures of the right maxillary sinus, right zygomatic arch, and right orbital rim with some periorbital air on the right. 3.  Slightly depressed fracture of the right temporal calvarium.  CT MAXILLOFACIAL  Findings:  On bone window images, there is fracture of the anterior, lateral, and posterior walls of the right maxillary sinus with complete opacification of the right maxillary sinus.  There is comminuted fracture of the right zygomatic arch involving the frontozygomatic, mid zygomatic, and posterior aspects of the zygomatic arch with some displacement.  There is fracture of  the right lateral orbital rim with slight displacement and some narrowing of the optic canal superiorly.  There is also a fracture of the right temporal calvarium with slight depression. There is a large scalp hematoma overlying the right lateral orbital rim and right temporal calvarium with some air in the soft tissues.  The mastoid air cells appear pneumatized.  No mandibular fracture is seen.  Both mandibular condyles appear to be in normal position. The left frontal sinus is aerated.  The nasal airway is patent and the turbinates are normal position.  IMPRESSION:  1.  Comminuted fracture of the anterior, lateral, and posterior walls of the right maxillary sinus with complete opacification of the right maxillary sinus. 2.  Fracture of the right lateral orbital rim with some periorbital air. 3.  Comminuted fracture of the right zygomatic arch with displacement of fragments. 4.  Slightly depressed fracture of the right temporal calvarium with overlying scalp hematoma.  CT CERVICAL SPINE  Findings:   The cervical vertebrae are in normal alignment.  There is degenerative disc disease particularly at the C6-7 level with loss of disc space, sclerosis, and spurring.  No prevertebral soft tissue swelling is seen.  The odontoid process is intact.  No cervical spine fracture is seen.  Foraminal narrowing is noted particularly the C6-7 level on the right.  The thyroid gland is unremarkable.  IMPRESSION:  1.  Normal alignment.  No acute fracture. 2.  Degenerative disc disease primarily at C6-7.   Original Report Authenticated By: Dwyane Dee, M.D.     Exam: Right facial swelling has decreased, EOMI intact and no diplopia, stepoff along the right zygomatic arch.  A/P: Right ZMC, Right Comminuted Zygomatic Arch  1. To OR tomorrow at 5pm for ORIF of right ZMC, and closed reduction of Zygomatic Arch (Consent reviewed and signed on chart.) 2. Start Augmentin 875mg  bid 3. Continue ice to the right face.   4. NPO tomorrow  morning after 0800.   [place],Manami Tutor L 02/19/2012, 9:35 AM

## 2012-02-19 NOTE — Progress Notes (Signed)
Patient ID: Mckenzie Leonard, female   DOB: August 02, 1950, 61 y.o.   MRN: [id number]   LOS: 1 day   Subjective: No new c/o.  Objective: Vital signs in last 24 hours: Temp:  [97.8 F (36.6 C)-98.7 F (37.1 C)] 98.7 F (37.1 C) (12/22 0800) Pulse Rate:  [65-98] 80  (12/22 0800) Resp:  [12-24] 16  (12/22 0800) BP: (96-133)/(50-75) 122/59 mmHg (12/22 0800) SpO2:  [90 %-100 %] 90 % (12/22 0800) Weight:  [176 lb 9.4 oz (80.1 kg)] 176 lb 9.4 oz (80.1 kg) (12/21 1440) Last BM Date: 02/17/12   Lab Results:  CBC  Basename 02/19/12 0500 02/18/12 0718  WBC 10.8* 14.6*  HGB 11.0* 11.8*  HCT 33.5* 35.8*  PLT 273 254   BMET  Basename 02/19/12 0500 02/18/12 0718  NA 138 140  K 4.8 2.9*  CL 102 102  CO2 27 24  GLUCOSE 129* 115*  BUN 14 19  CREATININE 0.60 0.67  CALCIUM 9.1 8.9   CBG (last 3)   Basename 02/19/12 0735 02/18/12 2142 02/18/12 1739  GLUCAP 106* 127* 160*      General appearance: alert and no distress Resp: clear to auscultation bilaterally Cardio: regular rate and rhythm GI: normal findings: bowel sounds normal and soft, non-tender   Assessment/Plan: Blunt head trauma Multiple right facial fxs -- For ORIF tomorrow by Dr. Jeanice Lim Right temporal skull fx ABL anemia -- Stable, mild. DM -- Stable Mult med probs -- Home meds FEN -- D/C K-dur. Orals for pain. D/c prednisone after tomorrow morning's dose. VTE -- SCD's Dispo -- OR tomorrow then likely home after surgery or the next day. Transfer to floor.   Freeman Caldron, PA-C Pager: [phone] General Trauma PA Pager: [phone]   02/19/2012

## 2012-02-19 NOTE — Evaluation (Signed)
Physical Therapy Evaluation Patient Details Name: Mckenzie Leonard MRN: [id number] DOB: 1950-12-07 Today's Date: 02/19/2012 Time: 7829-5621 PT Time Calculation (min): 22 min  PT Assessment / Plan / Recommendation Clinical Impression  Patient is a 61 yo female admitted with facial fractures from blunt head trauma.  Rt. shoulder "sore" from impact as well.  Patient lives alone - will have prn assist from daughters at discharge.  Patient with slight unsteadiness with gait.  Will benefit from acute PT to maximize independence/safety with mobility/gait prior to return home.  No f/u PT anticipated.    PT Assessment  Patient needs continued PT services    Follow Up Recommendations  No PT follow up;Supervision - Intermittent    Does the patient have the potential to tolerate intense rehabilitation      Barriers to Discharge Decreased caregiver support      Equipment Recommendations  None recommended by PT    Recommendations for Other Services     Frequency Min 3X/week    Precautions / Restrictions Precautions Precautions: None Restrictions Weight Bearing Restrictions: No   Pertinent Vitals/Pain       Mobility  Bed Mobility Bed Mobility: Supine to Sit;Sitting - Scoot to Edge of Bed Supine to Sit: 5: Supervision;HOB elevated Sitting - Scoot to Edge of Bed: 5: Supervision;With rail Details for Bed Mobility Assistance: Verbal cues for technique.  Supervision for safety Transfers Transfers: Sit to Stand;Stand to Sit Sit to Stand: 4: Min guard;With upper extremity assist;From bed Stand to Sit: 4: Min guard;With upper extremity assist;With armrests;To chair/3-in-1 Details for Transfer Assistance: Verbal cues for hand placement.  Assist for safety/balance. Ambulation/Gait Ambulation/Gait Assistance: 4: Min guard Ambulation Distance (Feet): 120 Feet Assistive device: None Ambulation/Gait Assistance Details: Verbal cues to turn head to scan environment for obstacles (rt eye swollen  shut).  Cues to move at slower safe speed.  Slightly unsteady during turns.  Cues to rest RUE down at side (holding RUE flexed against chest). Gait Pattern: Step-through pattern;Decreased stride length Gait velocity: Slow gait speed.           PT Diagnosis: Abnormality of gait;Difficulty walking;Acute pain  PT Problem List: Decreased activity tolerance;Decreased balance;Decreased mobility;Pain PT Treatment Interventions: Gait training;Stair training;Functional mobility training;Balance training;Patient/family education   PT Goals Acute Rehab PT Goals PT Goal Formulation: With patient Time For Goal Achievement: 02/26/12 Potential to Achieve Goals: Good Pt will go Supine/Side to Sit: Independently;with HOB 0 degrees PT Goal: Supine/Side to Sit - Progress: Goal set today Pt will go Sit to Stand: Independently;with upper extremity assist PT Goal: Sit to Stand - Progress: Goal set today Pt will Ambulate: >150 feet;Independently (without loss of balance) PT Goal: Ambulate - Progress: Goal set today Pt will Go Up / Down Stairs: 3-5 stairs;with modified independence;with rail(s) PT Goal: Up/Down Stairs - Progress: Goal set today  Visit Information  Last PT Received On: 02/19/12 Assistance Needed: +1    Subjective Data  Subjective: "My daughters will help me at home" Patient Stated Goal: To return home   Prior Functioning  Home Living Lives With: Alone Available Help at Discharge: Family;Available PRN/intermittently Type of Home: Mobile home Home Access: Stairs to enter Entrance Stairs-Number of Steps: 5 Entrance Stairs-Rails: Right;Left Home Layout: One level Bathroom Shower/Tub: Engineer, manufacturing systems: Standard Home Adaptive Equipment: None Prior Function Level of Independence: Independent Able to Take Stairs?: Yes Driving: Yes Vocation: Full time employment Communication Communication: No difficulties    Cognition  Overall Cognitive Status: Appears within  functional limits  for tasks assessed/performed Arousal/Alertness: Awake/alert Orientation Level: Appears intact for tasks assessed Behavior During Session: [facility] for tasks performed    Extremity/Trunk Assessment Right Upper Extremity Assessment RUE ROM/Strength/Tone: Deficits RUE ROM/Strength/Tone Deficits: Decreased shoulder flexion due to pain.  RUE Sensation: WFL - Light Touch Left Upper Extremity Assessment LUE ROM/Strength/Tone: Within functional levels LUE Sensation: WFL - Light Touch Right Lower Extremity Assessment RLE ROM/Strength/Tone: Within functional levels RLE Sensation: WFL - Light Touch Left Lower Extremity Assessment LLE ROM/Strength/Tone: Within functional levels LLE Sensation: WFL - Light Touch   Balance Balance Balance Assessed: Yes High Level Balance High Level Balance Activites: Turns;Sudden stops;Head turns High Level Balance Comments: Slight unsteadiness with high level activities - able to self-correct balance.  End of Session PT - End of Session Equipment Utilized During Treatment: Gait belt Activity Tolerance: Patient limited by fatigue Patient left: in chair;with call bell/phone within reach;with family/visitor present Nurse Communication: Mobility status  GP     Vena Austria 02/19/2012, 11:50 AM Durenda Hurt. Renaldo Fiddler, [facility] Acute Rehab Services Pager [phone]

## 2012-02-20 ENCOUNTER — Encounter (HOSPITAL_COMMUNITY): Payer: Self-pay | Admitting: Certified Registered Nurse Anesthetist

## 2012-02-20 ENCOUNTER — Inpatient Hospital Stay (HOSPITAL_COMMUNITY): Payer: Worker's Compensation | Admitting: Certified Registered Nurse Anesthetist

## 2012-02-20 ENCOUNTER — Encounter (HOSPITAL_COMMUNITY): Admission: EM | Disposition: A | Discharge status: Home / Self Care | Payer: Self-pay | Source: Home / Self Care

## 2012-02-20 HISTORY — PX: ORIF FACIAL FRACTURE: SHX2118

## 2012-02-20 HISTORY — PX: CLOSED REDUCTION MANDIBLE: SHX5307

## 2012-02-20 LAB — POCT I-STAT 3, ART BLOOD GAS (G3+)
Acid-base deficit: 15 mmol/L — ABNORMAL HIGH (ref 0.0–2.0)
O2 Saturation: 92 %

## 2012-02-20 LAB — GLUCOSE, CAPILLARY
Glucose-Capillary: 108 mg/dL — ABNORMAL HIGH (ref 70–99)
Glucose-Capillary: 130 mg/dL — ABNORMAL HIGH (ref 70–99)
Glucose-Capillary: 118 mg/dL — ABNORMAL HIGH (ref 70–99)

## 2012-02-20 LAB — SURGICAL PCR SCREEN: Staphylococcus aureus: NEGATIVE

## 2012-02-20 SURGERY — OPEN REDUCTION INTERNAL FIXATION (ORIF) MULTIPLE FACIAL FRACTURES
Anesthesia: General | Site: Face | Laterality: Right | Wound class: Clean Contaminated

## 2012-02-20 MED ORDER — SUCCINYLCHOLINE CHLORIDE 20 MG/ML IJ SOLN
INTRAMUSCULAR | Status: DC | PRN
  Administered 2012-02-20: 80 mg via INTRAVENOUS

## 2012-02-20 MED ORDER — MIDAZOLAM HCL 5 MG/5ML IJ SOLN
INTRAMUSCULAR | Status: DC | PRN
  Administered 2012-02-20: 2 mg via INTRAVENOUS

## 2012-02-20 MED ORDER — PHENYLEPHRINE HCL 10 MG/ML IJ SOLN
INTRAMUSCULAR | Status: DC | PRN
  Administered 2012-02-20 (×6): 40 ug via INTRAVENOUS
  Administered 2012-02-20 (×2): 80 ug via INTRAVENOUS
  Administered 2012-02-20: 40 ug via INTRAVENOUS

## 2012-02-20 MED ORDER — HYDROMORPHONE HCL PF 1 MG/ML IJ SOLN
INTRAMUSCULAR | Status: AC
  Administered 2012-02-20: 0.5 mg via INTRAVENOUS
  Filled 2012-02-20: qty 1

## 2012-02-20 MED ORDER — BUPIVACAINE-EPINEPHRINE 0.5% -1:200000 IJ SOLN
INTRAMUSCULAR | Status: DC | PRN
  Administered 2012-02-20: 10 mL

## 2012-02-20 MED ORDER — 0.9 % SODIUM CHLORIDE (POUR BTL) OPTIME
TOPICAL | Status: DC | PRN
  Administered 2012-02-20: 1000 mL

## 2012-02-20 MED ORDER — NEOSTIGMINE METHYLSULFATE 1 MG/ML IJ SOLN
INTRAMUSCULAR | Status: DC | PRN
  Administered 2012-02-20: 4 mg via INTRAVENOUS

## 2012-02-20 MED ORDER — MIDAZOLAM HCL 2 MG/2ML IJ SOLN: 1.0000 mg | INTRAMUSCULAR | Status: DC | PRN

## 2012-02-20 MED ORDER — ROCURONIUM BROMIDE 100 MG/10ML IV SOLN
INTRAVENOUS | Status: DC | PRN
  Administered 2012-02-20: 30 mg via INTRAVENOUS

## 2012-02-20 MED ORDER — EPHEDRINE SULFATE 50 MG/ML IJ SOLN
INTRAMUSCULAR | Status: DC | PRN
  Administered 2012-02-20 (×3): 10 mg via INTRAVENOUS

## 2012-02-20 MED ORDER — DOUBLE ANTIBIOTIC 500-10000 UNIT/GM EX OINT
TOPICAL_OINTMENT | CUTANEOUS | Status: AC
  Filled 2012-02-20: qty 1

## 2012-02-20 MED ORDER — LIDOCAINE HCL (CARDIAC) 20 MG/ML IV SOLN
INTRAVENOUS | Status: DC | PRN
  Administered 2012-02-20: 80 mg via INTRAVENOUS

## 2012-02-20 MED ORDER — BACITRACIN ZINC 500 UNIT/GM EX OINT
TOPICAL_OINTMENT | CUTANEOUS | Status: DC | PRN
  Administered 2012-02-20: 1 via TOPICAL

## 2012-02-20 MED ORDER — LACTATED RINGERS IV SOLN
INTRAVENOUS | Status: DC
  Administered 2012-02-20: 17:00:00 via INTRAVENOUS

## 2012-02-20 MED ORDER — LACTATED RINGERS IV SOLN
INTRAVENOUS | Status: DC | PRN
  Administered 2012-02-20 (×2): via INTRAVENOUS

## 2012-02-20 MED ORDER — FENTANYL CITRATE 0.05 MG/ML IJ SOLN: 50.0000 ug | Freq: Once | INTRAMUSCULAR | Status: DC

## 2012-02-20 MED ORDER — HYDROMORPHONE HCL PF 1 MG/ML IJ SOLN
0.2500 mg | INTRAMUSCULAR | Status: DC | PRN
  Administered 2012-02-20: 0.5 mg via INTRAVENOUS

## 2012-02-20 MED ORDER — OXYMETAZOLINE HCL 0.05 % NA SOLN
NASAL | Status: DC | PRN
  Administered 2012-02-20: 2 via NASAL

## 2012-02-20 MED ORDER — LIDOCAINE-EPINEPHRINE (PF) 1 %-1:200000 IJ SOLN
INTRAMUSCULAR | Status: DC | PRN
  Administered 2012-02-20: 10 mL

## 2012-02-20 MED ORDER — ONDANSETRON HCL 4 MG/2ML IJ SOLN
INTRAMUSCULAR | Status: DC | PRN
  Administered 2012-02-20: 4 mg via INTRAVENOUS

## 2012-02-20 MED ORDER — FENTANYL CITRATE 0.05 MG/ML IJ SOLN
INTRAMUSCULAR | Status: DC | PRN
  Administered 2012-02-20: 100 ug via INTRAVENOUS
  Administered 2012-02-20 (×2): 50 ug via INTRAVENOUS

## 2012-02-20 MED ORDER — GLYCOPYRROLATE 0.2 MG/ML IJ SOLN
INTRAMUSCULAR | Status: DC | PRN
  Administered 2012-02-20: .6 mg via INTRAVENOUS

## 2012-02-20 SURGICAL SUPPLY — 51 items
BAND DENTAL 1/4IN PULL MED (MISCELLANEOUS) IMPLANT
BAND DENTAL 3/16IN PULL HEAVY (MISCELLANEOUS) IMPLANT
BIT DRILL TWIST 1.3X5 (BIT) ×1
BIT DRILL TWIST 1.3X5MM (BIT) IMPLANT
BUR CROSS CUT FISSURE 1.6 (BURR) IMPLANT
BUR RND FLUTED 2.5 (BURR) IMPLANT
CANISTER SUCTION 2500CC (MISCELLANEOUS) ×1 IMPLANT
CATH ROBINSON RED A/P 14FR (CATHETERS) ×1 IMPLANT
CLOTH BEACON ORANGE TIMEOUT ST (SAFETY) ×2 IMPLANT
COVER SURGICAL LIGHT HANDLE (MISCELLANEOUS) ×2 IMPLANT
DECANTER SPIKE VIAL GLASS SM (MISCELLANEOUS) ×2 IMPLANT
DRILL BIT TWIST 1.3X5MM (BIT) ×2
DRSG ADAPTIC 3X8 NADH LF (GAUZE/BANDAGES/DRESSINGS) ×1 IMPLANT
ELECT NDL BLADE 2-5/6 (NEEDLE) IMPLANT
ELECT NEEDLE BLADE 2-5/6 (NEEDLE) ×2 IMPLANT
ELECT REM PT RETURN 9FT ADLT (ELECTROSURGICAL) ×2
ELECTRODE REM PT RTRN 9FT ADLT (ELECTROSURGICAL) IMPLANT
GAUZE SPONGE 2X2 8PLY STRL LF (GAUZE/BANDAGES/DRESSINGS) IMPLANT
GLOVE BIOGEL PI IND STRL 7.5 (GLOVE) ×1 IMPLANT
GLOVE BIOGEL PI INDICATOR 7.5 (GLOVE) ×1
GLOVE ORTHO TXT STRL SZ7.5 (GLOVE) ×2 IMPLANT
GOWN STRL NON-REIN LRG LVL3 (GOWN DISPOSABLE) ×4 IMPLANT
KIT BASIN OR (CUSTOM PROCEDURE TRAY) ×2 IMPLANT
KIT ROOM TURNOVER OR (KITS) ×2 IMPLANT
NDL HYPO 25GX1X1/2 BEV (NEEDLE) IMPLANT
NEEDLE HYPO 25GX1X1/2 BEV (NEEDLE) ×2 IMPLANT
NS IRRIG 1000ML POUR BTL (IV SOLUTION) ×1 IMPLANT
PAD ARMBOARD 7.5X6 YLW CONV (MISCELLANEOUS) ×4 IMPLANT
PENCIL BUTTON HOLSTER BLD 10FT (ELECTRODE) ×1 IMPLANT
PLATE MID FACE 10H CURVED (Plate) ×1 IMPLANT
PLATE MID FACE 4H CURVED (Plate) ×1 IMPLANT
PLATE MID FACE 6H 8MM 100D LFT (Plate) ×1 IMPLANT
PLATE MID FACE 6H 8MM 100D RT (Plate) ×1 IMPLANT
SCISSORS WIRE ANG 4 3/4 DISP (INSTRUMENTS) ×2 IMPLANT
SCREW MIDFACE 1.7X3 SLF DRILL (Screw) ×14 IMPLANT
SCREW MIDFACE 1.7X4 SLF DRILL (Screw) ×8 IMPLANT
SCREW MIDFACE 1.9X3MM (Screw) ×2 IMPLANT
SLEEVE SURGEON STRL (DRAPES) ×1 IMPLANT
SPONGE GAUZE 2X2 STER 10/PKG (GAUZE/BANDAGES/DRESSINGS) ×1
SPONGE GAUZE 4X4 12PLY (GAUZE/BANDAGES/DRESSINGS) ×2 IMPLANT
SUCTION FRAZIER TIP 10 FR DISP (SUCTIONS) ×1 IMPLANT
SUT CHROMIC 3 0 SH 27 (SUTURE) ×1 IMPLANT
SUT PROLENE 5 0 RB 1 DA (SUTURE) ×1 IMPLANT
SUT VIC AB 4-0 RB1 27 (SUTURE) ×2
SUT VIC AB 4-0 RB1 27X BRD (SUTURE) IMPLANT
SYR CONTROL 10ML LL (SYRINGE) ×1 IMPLANT
TOOTHBRUSH ADULT (PERSONAL CARE ITEMS) IMPLANT
TRAY ENT MC OR (CUSTOM PROCEDURE TRAY) ×2 IMPLANT
TUBING IRRIGATION (MISCELLANEOUS) IMPLANT
WATER STERILE IRR 1000ML POUR (IV SOLUTION) IMPLANT
YANKAUER SUCT BULB TIP NO VENT (SUCTIONS) IMPLANT

## 2012-02-20 NOTE — Preoperative (Signed)
Beta Blockers   Reason not to administer Beta Blockers:Not Applicable 

## 2012-02-20 NOTE — Progress Notes (Signed)
Looks pretty good.  Can probably go home tomorrwo after surgery today.  This patient has been seen and I agree with the findings and treatment plan.  Marta Lamas. Gae Bon, MD, FACS [phone] (pager) [phone] (direct pager) Trauma Surgeon

## 2012-02-20 NOTE — Progress Notes (Signed)
LOS: 2 days   Subjective: Pt feeling better today than yesterday.  Pt states her eyes are getting better and she's able to see out of both eyes.  Pt notes pain/swelling in face/head, no eye pain.  Pt tolerating diet and urinating on own, no BM yet.  Pt ambulating through halls.    Objective: Vital signs in last 24 hours: Temp:  [97.8 F (36.6 C)-99 F (37.2 C)] 98.6 F (37 C) (12/23 0536) Pulse Rate:  [65-80] 65  (12/23 0536) Resp:  [16-18] 18  (12/23 0156) BP: (99-122)/(46-60) 100/51 mmHg (12/23 0536) SpO2:  [90 %-98 %] 98 % (12/23 0536) Last BM Date: 02/17/12  Lab Results:  CBC  Basename 02/19/12 0500 02/18/12 0718  WBC 10.8* 14.6*  HGB 11.0* 11.8*  HCT 33.5* 35.8*  PLT 273 254   BMET  Basename 02/19/12 0500 02/18/12 0718  NA 138 140  K 4.8 2.9*  CL 102 102  CO2 27 24  GLUCOSE 129* 115*  BUN 14 19  CREATININE 0.60 0.67  CALCIUM 9.1 8.9    Imaging: Dg Shoulder Right  02/18/2012  *RADIOLOGY REPORT*  Clinical Data: Pain post trauma  RIGHT SHOULDER - 2+ VIEW  Comparison: None.  Findings: Three views of the right shoulder submitted.  No acute fracture or subluxation.  Mild degenerative changes right AC joint.  IMPRESSION: No acute fracture or subluxation.  Mild degenerative changes right AC joint.   Original Report Authenticated By: Natasha Mead, M.D.    Ct Head Wo Contrast  02/18/2012  *RADIOLOGY REPORT*  Clinical Data:  Blunt trauma to the face at work, hit in right side of face and eye with swelling  CT HEAD WITHOUT CONTRAST CT MAXILLOFACIAL WITHOUT CONTRAST CT CERVICAL SPINE WITHOUT CONTRAST  Technique:  Multidetector CT imaging of the head, cervical spine, and maxillofacial structures were performed using the standard protocol without intravenous contrast. Multiplanar CT image reconstructions of the cervical spine and maxillofacial structures were also generated.  Comparison:  CT brain of 01/31/2009  CT HEAD  Findings: The ventricular system is stable in size and  configuration, and the septum remains midline position.  No hemorrhage, mass lesion, or acute infarction is seen.  A right facial scalp hematoma is noted which will be better assessed on CT maxillofacial.  On bone window images, fractures of the right maxillary sinus and right zygomatic arch are noted with opacification of the right maxillary sinus.  The right orbital rim also is fractured.  There is a small amount of periorbital air. A minimally depressed fracture of the right temporal calvarium is noted.  IMPRESSION:  1.  No acute intracranial abnormality. 2.  Fractures of the right maxillary sinus, right zygomatic arch, and right orbital rim with some periorbital air on the right. 3.  Slightly depressed fracture of the right temporal calvarium.  CT MAXILLOFACIAL  Findings:  On bone window images, there is fracture of the anterior, lateral, and posterior walls of the right maxillary sinus with complete opacification of the right maxillary sinus.  There is comminuted fracture of the right zygomatic arch involving the frontozygomatic, mid zygomatic, and posterior aspects of the zygomatic arch with some displacement.  There is fracture of the right lateral orbital rim with slight displacement and some narrowing of the optic canal superiorly.  There is also a fracture of the right temporal calvarium with slight depression. There is a large scalp hematoma overlying the right lateral orbital rim and right temporal calvarium with some air in the soft tissues.  The mastoid air cells appear pneumatized.  No mandibular fracture is seen.  Both mandibular condyles appear to be in normal position. The left frontal sinus is aerated.  The nasal airway is patent and the turbinates are normal position.  IMPRESSION:  1.  Comminuted fracture of the anterior, lateral, and posterior walls of the right maxillary sinus with complete opacification of the right maxillary sinus. 2.  Fracture of the right lateral orbital rim with some  periorbital air. 3.  Comminuted fracture of the right zygomatic arch with displacement of fragments. 4.  Slightly depressed fracture of the right temporal calvarium with overlying scalp hematoma.  CT CERVICAL SPINE  Findings:   The cervical vertebrae are in normal alignment.  There is degenerative disc disease particularly at the C6-7 level with loss of disc space, sclerosis, and spurring.  No prevertebral soft tissue swelling is seen.  The odontoid process is intact.  No cervical spine fracture is seen.  Foraminal narrowing is noted particularly the C6-7 level on the right.  The thyroid gland is unremarkable.  IMPRESSION:  1.  Normal alignment.  No acute fracture. 2.  Degenerative disc disease primarily at C6-7.   Original Report Authenticated By: Dwyane Dee, M.D.    Ct Cervical Spine Wo Contrast  02/18/2012  *RADIOLOGY REPORT*  Clinical Data:  Blunt trauma to the face at work, hit in right side of face and eye with swelling  CT HEAD WITHOUT CONTRAST CT MAXILLOFACIAL WITHOUT CONTRAST CT CERVICAL SPINE WITHOUT CONTRAST  Technique:  Multidetector CT imaging of the head, cervical spine, and maxillofacial structures were performed using the standard protocol without intravenous contrast. Multiplanar CT image reconstructions of the cervical spine and maxillofacial structures were also generated.  Comparison:  CT brain of 01/31/2009  CT HEAD  Findings: The ventricular system is stable in size and configuration, and the septum remains midline position.  No hemorrhage, mass lesion, or acute infarction is seen.  A right facial scalp hematoma is noted which will be better assessed on CT maxillofacial.  On bone window images, fractures of the right maxillary sinus and right zygomatic arch are noted with opacification of the right maxillary sinus.  The right orbital rim also is fractured.  There is a small amount of periorbital air. A minimally depressed fracture of the right temporal calvarium is noted.  IMPRESSION:  1.   No acute intracranial abnormality. 2.  Fractures of the right maxillary sinus, right zygomatic arch, and right orbital rim with some periorbital air on the right. 3.  Slightly depressed fracture of the right temporal calvarium.  CT MAXILLOFACIAL  Findings:  On bone window images, there is fracture of the anterior, lateral, and posterior walls of the right maxillary sinus with complete opacification of the right maxillary sinus.  There is comminuted fracture of the right zygomatic arch involving the frontozygomatic, mid zygomatic, and posterior aspects of the zygomatic arch with some displacement.  There is fracture of the right lateral orbital rim with slight displacement and some narrowing of the optic canal superiorly.  There is also a fracture of the right temporal calvarium with slight depression. There is a large scalp hematoma overlying the right lateral orbital rim and right temporal calvarium with some air in the soft tissues.  The mastoid air cells appear pneumatized.  No mandibular fracture is seen.  Both mandibular condyles appear to be in normal position. The left frontal sinus is aerated.  The nasal airway is patent and the turbinates are normal position.  IMPRESSION:  1.  Comminuted fracture of the anterior, lateral, and posterior walls of the right maxillary sinus with complete opacification of the right maxillary sinus. 2.  Fracture of the right lateral orbital rim with some periorbital air. 3.  Comminuted fracture of the right zygomatic arch with displacement of fragments. 4.  Slightly depressed fracture of the right temporal calvarium with overlying scalp hematoma.  CT CERVICAL SPINE  Findings:   The cervical vertebrae are in normal alignment.  There is degenerative disc disease particularly at the C6-7 level with loss of disc space, sclerosis, and spurring.  No prevertebral soft tissue swelling is seen.  The odontoid process is intact.  No cervical spine fracture is seen.  Foraminal narrowing is  noted particularly the C6-7 level on the right.  The thyroid gland is unremarkable.  IMPRESSION:  1.  Normal alignment.  No acute fracture. 2.  Degenerative disc disease primarily at C6-7.   Original Report Authenticated By: Dwyane Dee, M.D.    Ct Maxillofacial Wo Cm  02/18/2012  *RADIOLOGY REPORT*  Clinical Data:  Blunt trauma to the face at work, hit in right side of face and eye with swelling  CT HEAD WITHOUT CONTRAST CT MAXILLOFACIAL WITHOUT CONTRAST CT CERVICAL SPINE WITHOUT CONTRAST  Technique:  Multidetector CT imaging of the head, cervical spine, and maxillofacial structures were performed using the standard protocol without intravenous contrast. Multiplanar CT image reconstructions of the cervical spine and maxillofacial structures were also generated.  Comparison:  CT brain of 01/31/2009  CT HEAD  Findings: The ventricular system is stable in size and configuration, and the septum remains midline position.  No hemorrhage, mass lesion, or acute infarction is seen.  A right facial scalp hematoma is noted which will be better assessed on CT maxillofacial.  On bone window images, fractures of the right maxillary sinus and right zygomatic arch are noted with opacification of the right maxillary sinus.  The right orbital rim also is fractured.  There is a small amount of periorbital air. A minimally depressed fracture of the right temporal calvarium is noted.  IMPRESSION:  1.  No acute intracranial abnormality. 2.  Fractures of the right maxillary sinus, right zygomatic arch, and right orbital rim with some periorbital air on the right. 3.  Slightly depressed fracture of the right temporal calvarium.  CT MAXILLOFACIAL  Findings:  On bone window images, there is fracture of the anterior, lateral, and posterior walls of the right maxillary sinus with complete opacification of the right maxillary sinus.  There is comminuted fracture of the right zygomatic arch involving the frontozygomatic, mid zygomatic, and  posterior aspects of the zygomatic arch with some displacement.  There is fracture of the right lateral orbital rim with slight displacement and some narrowing of the optic canal superiorly.  There is also a fracture of the right temporal calvarium with slight depression. There is a large scalp hematoma overlying the right lateral orbital rim and right temporal calvarium with some air in the soft tissues.  The mastoid air cells appear pneumatized.  No mandibular fracture is seen.  Both mandibular condyles appear to be in normal position. The left frontal sinus is aerated.  The nasal airway is patent and the turbinates are normal position.  IMPRESSION:  1.  Comminuted fracture of the anterior, lateral, and posterior walls of the right maxillary sinus with complete opacification of the right maxillary sinus. 2.  Fracture of the right lateral orbital rim with some periorbital air. 3.  Comminuted fracture of the  right zygomatic arch with displacement of fragments. 4.  Slightly depressed fracture of the right temporal calvarium with overlying scalp hematoma.  CT CERVICAL SPINE  Findings:   The cervical vertebrae are in normal alignment.  There is degenerative disc disease particularly at the C6-7 level with loss of disc space, sclerosis, and spurring.  No prevertebral soft tissue swelling is seen.  The odontoid process is intact.  No cervical spine fracture is seen.  Foraminal narrowing is noted particularly the C6-7 level on the right.  The thyroid gland is unremarkable.  IMPRESSION:  1.  Normal alignment.  No acute fracture. 2.  Degenerative disc disease primarily at C6-7.   Original Report Authenticated By: Dwyane Dee, M.D.      PE: General appearance: alert, cooperative and no distress Head: periorbital and facial edema worse on right side with periorbital ecchymosis on right. Eyes: conjunctivae/corneas clear. PERRL, EOM's intact, vision grossly intact Resp: clear to auscultation bilaterally Cardio: regular  rate and rhythm, S1, S2 normal, no murmur, click, rub or gallop GI: soft, non-tender; bowel sounds normal; no masses,  no organomegaly Extremities: extremities normal, atraumatic, no cyanosis or edema, right shoulder with some tenderness upon palpation and flexion Skin: ecchymosis in right axilla/back   Assessment/Plan: Blunt head trauma  Multiple right facial fxs -- For ORIF tonight by Dr. Jeanice Lim  Right temporal skull fx  Right shoulder pain -- xray negative, may need OP Ortho/Rehab Anxiety -- surrounding returning to work after work accident, consult for SW ABL anemia -- Stable, mild.  DM -- Stable  Mult med probs -- Home meds  FEN -- Orals for pain. D/c prednisone VTE -- SCD's  Dispo -- OR today then likely home after surgery or the next day.      Aris Georgia, PA-C Pager: [phone] General Trauma PA Pager: [phone]   02/20/2012

## 2012-02-20 NOTE — Progress Notes (Signed)
Physical Therapy Treatment Patient Details Name: Mckenzie Leonard MRN: [id number] DOB: April 02, 1950 Today's Date: 02/20/2012 Time: 1308-6578 PT Time Calculation (min): 31 min  PT Assessment / Plan / Recommendation Comments on Treatment Session  Patient progressing well with mobility.  Some gait abnormality remains.  Will return tomorrow to ensure mobility not changed after surgery.  If okay will d/c PT and allow family to assist her to walk in hallway.    Follow Up Recommendations  No PT follow up;Supervision - Intermittent                 Equipment Recommendations  None recommended by PT        Frequency Min 3X/week   Plan Discharge plan remains appropriate    Precautions / Restrictions Precautions Precautions: None   Pertinent Vitals/Pain Denies pain    Mobility  Bed Mobility Bed Mobility: Sit to Supine Supine to Sit: 6: Modified independent (Device/Increase time) Sit to Supine: 6: Modified independent (Device/Increase time) Transfers Sit to Stand: 6: Modified independent (Device/Increase time) Stand to Sit: 6: Modified independent (Device/Increase time) Ambulation/Gait Ambulation/Gait Assistance: 5: Supervision Ambulation Distance (Feet): 350 Feet Assistive device: None Ambulation/Gait Assistance Details: decreased step length, decreased trunk rotation and slow speed      PT Goals Acute Rehab PT Goals Pt will go Supine/Side to Sit: Independently PT Goal: Supine/Side to Sit - Progress: Met Pt will go Sit to Stand: Independently;with upper extremity assist PT Goal: Sit to Stand - Progress: Met Pt will Ambulate: >150 feet;Independently (no loss of balance) PT Goal: Ambulate - Progress: Progressing toward goal  Visit Information  Last PT Received On: 02/20/12    Subjective Data  Subjective: Been getting up.  Washed myself today.  To OR at 5pm.   Cognition  Overall Cognitive Status: Appears within functional limits for tasks  assessed/performed Arousal/Alertness: Awake/alert Orientation Level: Appears intact for tasks assessed Behavior During Session: [facility] for tasks performed    Balance  Balance Balance Assessed: Yes Static Standing Balance Static Standing - Balance Support: Right upper extremity supported (on wall rail) Static Standing - Level of Assistance: 6: Modified independent (Device/Increase time) Static Standing - Comment/# of Minutes: stood in hallway with family supervision while PT stepped into utility room for comb  End of Session PT - End of Session Equipment Utilized During Treatment: Gait belt Activity Tolerance: Patient tolerated treatment well Patient left: in bed;with call bell/phone within reach;with family/visitor present   GP     [facility] 2 02/20/2012, 4:44 PM Sheran Lawless, PT [phone] 02/20/2012

## 2012-02-20 NOTE — Interval H&P Note (Signed)
History and Physical Interval Note:  02/20/2012 5:14 PM  Mckenzie Leonard  has presented today for surgery, with the diagnosis of rt ZMC fx, Rt zygo arch fx  The various methods of treatment have been discussed with the patient and family. After consideration of risks, benefits and other options for treatment, the patient has consented to procedure: Open reduction internal fixation of right zygomaticomaxillary complex fracture and closed reduction of the right zygomatic arch as a surgical intervention .  The patient's history has been reviewed, patient examined, no change in status, stable for surgery.  I have reviewed the patient's chart and labs.  Questions were answered to the patient's satisfaction.     Durant,Jabori Henegar L

## 2012-02-20 NOTE — Op Note (Signed)
02/18/2012 - 02/20/2012  8:41 PM  PATIENT:  Mckenzie Leonard  61 y.o. female   PRE-OPERATIVE DIAGNOSIS:  Right Zygomaticomaxillary Fracture, Right Zygomatic Arch Fracture  POST-OPERATIVE DIAGNOSIS:  Right Zygomaticomaxillary Fracture, Right Zygomatic Arch Fracture  PROCEDURE:  ORIF Right Zygomaticomaxillary Fracture, Closed Reduction of Right Zygomatic Arch Fracture  INDICATIONS FOR PROCEDURE: Mckenzie Leonard is a 61 y.o. Female that sustained a right ZMC fracture and a comminuted right zygomatic arch fracture while at work on this past Saturday.  She has a spool come off of a machine and strike her in the face causing the resultant facial fractures.  SURGEON:  Surgeon(s): Francene Finders, DDS  PHYSICIAN ASSISTANT: None  ASSISTANTS: none   ANESTHESIA:   general  PROCEDURE:  The patient was seen in the pre-operative area and all of her questions were answered.  The history and physical were updated and verified.  The patient's consent was signed and verified as well.  The patient was then marked on the right face.The patient was taken to the operating room by anesthesia where she was placed on the table in a supine position.  She was nasally intubated.  The patient was prepared and draped as standard for Oral and Maxillofacial Surgery.    A moistened Raytec was placed in the patient's oropharynx.  Local anesthetic was placed into the right lateral brow and intraorally.  Next a 3 cm incision was made at the right lateral brow region using a 15 blade down into subcutaneous tissue.  Then a Bovie set at 30/30 was used to dissect down to the fracture along the lateral brow..  Then our attention was turned intraoral.  The Bovie was used to create a hemi- LeFort style incision extended from the Zygomatic buttress region to the midline.  A periosteal was used to dissect down to the fracture.  The zygoma was comminuted into large and small pieces.  A four hole Stryker midface plate was placed at the  lateral brow using 4 screws.  Next two "L" shaped plates and one long span plate was used to span and stabilize the fractured zygoma in the buttress and sinus region.  Small comminuted pieces of bone were removed due to lack of blood supply and risk of infection.  As a result, there was a 4 x 4 cm bony defect at the anterior maxillary wall.  A periosteal was used to dissect up the buttress region and perform a closed reduction of the arch.  A total of four midface plates and 19 screws were used to secure all of those plates.  Copious irrigation throughout the procedure. The lateral brow was closed with 4.0 vicryl deep and 5.0 prolene superficially.  Intraorally, the incision was closed with 3.0 gut suture.  The throat pack was removed from the oropharynx, and all counts were correct.    EBL:  Total I/O In: 300 [I.V.:300] Out: 20 [Blood:20]  DRAINS: none   LOCAL MEDICATIONS USED:  0.5% MARCAINE with 1:200,000 epinephrine; 2% LIDOCAINE with 1:100,000 epinephrine  SPECIMEN:  No Specimen  DISPOSITION OF SPECIMEN:  N/A  COUNTS:  YES  PLAN OF CARE: Admit to inpatient   PATIENT DISPOSITION:  PACU - hemodynamically stable.   Delay start of Pharmacological VTE agent (>24hrs) due to surgical blood loss or risk of bleeding:  not applicable

## 2012-02-20 NOTE — Clinical Social Work Psychosocial (Signed)
     Clinical Social Work Department BRIEF PSYCHOSOCIAL ASSESSMENT 02/20/2012  Patient:  Mckenzie Leonard, Mckenzie Leonard     Account Number:  [id number]     Admit date:  02/18/2012  Clinical Social Worker:  Pearson Forster  Date/Time:  02/20/2012 01:30 PM  Referred by:  Physician  Date Referred:  02/20/2012 Referred for  Other - See comment   Other Referral:   Return to work/Disability   Interview type:  Patient Other interview type:   Patient family at bedside    PSYCHOSOCIAL DATA Living Status:  ALONE Admitted from facility:   Level of care:   Primary support name:  Mckenzie Leonard  [phone] Primary support relationship to patient:  CHILD, ADULT Degree of support available:   Strong    CURRENT CONCERNS Current Concerns  Other - See comment   Other Concerns:   Patient concerned about returning to work due to the nature of the accident    SOCIAL WORK ASSESSMENT / PLAN Clinical Social Worker met with patient at bedside to offer support and discuss patient needs at discharge.  Patient states that she lives alone and her accident occurred at work.  Patient family members are all at bedside and were participating in CSW assessment.  Patient is 61 years old and has been with her current employer long enough to retire, however due to high insurance premiums that patient cannot afford she planned to continue to work til age 87. Patient states that with this most recent incident at work she no longer wants to return to that environment and questioned her ability to apply for Disability.  CSW explained that patient does not necessarily meet the criteria for Disability assistance but will notify financial counseling of her concerns in the event that she would be appropriate.  CSW recommended that possibility of looking into Medicaid option based on patient income - patient did not seem too thrilled with the idea.  Patient plans to return to her home with family assistance once medically ready.   Patient will remain out on Worker's Comp and then is hopeful for short term Disability through her employer.  CSW to explore other valuable options available for patient prior to discharge.    Clinical Social Worker inquired about patient current substance use.  Patient states "I have never had a drop of alcohol and I've never smoked a cigarette."  SBIRT complete and no resources needed.  Patient aware that CSW will notify if further resources become available, however CSW to sign off at this time.  Please reconsult if further needs arise.   Assessment/plan status:  No Further Intervention Required Other assessment/ plan:   Information/referral to community resources:   Clinical Social Worker to communicate with Artist and other CSW to staff to determine other possible options for insurance/financial assitance for patient to retire prior to age 34.    PATIENTS/FAMILYS RESPONSE TO PLAN OF CARE: Patient alert and oriented x3 laying in the bed.  Patient family all at bedside and participating actively in conversation.  Patient with support and assistance from family once discharged home.  Patient very anxious to begin Disability process to prevent returning to work at this time.  Patient did not express any emotional stress regarding the return to her current work environment. Patient seems to be reaching for assistance at this time. CSW will offer services if/when available.

## 2012-02-20 NOTE — Anesthesia Postprocedure Evaluation (Signed)
  Anesthesia Post Note  Patient: Mckenzie Leonard  Procedure(s) Performed: Procedure(s) (LRB): OPEN REDUCTION INTERNAL FIXATION (ORIF) MULTIPLE FACIAL FRACTURES (Right) CLOSED REDUCTION MANDIBULAR (Right)  Anesthesia type: general  Patient location: PACU  Post pain: Pain level controlled  Post assessment: Patient's Cardiovascular Status Stable  Last Vitals:  Filed Vitals:   02/20/12 2100  BP: 140/62  Pulse: 82  Temp:   Resp: 15    Post vital signs: Reviewed and stable  Level of consciousness: sedated  Complications: No apparent anesthesia complications

## 2012-02-20 NOTE — Progress Notes (Signed)
Nutrition Brief Note  Patient identified on the Malnutrition Screening Tool (MST) Report. Upon chart review and pt interview, she reports usual weight is 168 lb. Noted current weight is 176 lb. She states that her intake PTA was great. Tolerating clear liquid diet at this time.  Body mass index is 31.28 kg/(m^2). Pt meets criteria for Obese based on current BMI.   Current diet order is Clear Liquid, patient is consuming approximately 50-100% of meals at this time. Labs and medications reviewed.   No nutrition interventions warranted at this time. If nutrition issues arise, please consult RD.   Jarold Motto MS, RD, LDN Pager: [phone] After-hours pager: [phone]

## 2012-02-20 NOTE — Brief Op Note (Signed)
02/18/2012 - 02/20/2012  8:38 PM  PATIENT:  Mckenzie Leonard  61 y.o. female  PRE-OPERATIVE DIAGNOSIS:  Right Zygomaticomaxillary Fracture, Right Zygomatic Arch Fracture  POST-OPERATIVE DIAGNOSIS:  Right Zygomaticomaxillary Fracture, Right Zygomatic Arch Fracture  PROCEDURE:  ORIF Right Zygomaticomaxillary Fracture, Closed Reduction of Right Zygomatic Arch Fracture  SURGEON:  Surgeon(s) and Role:    * Francene Finders, DDS - Primary  PHYSICIAN ASSISTANT: None  ASSISTANTS: none   ANESTHESIA:   general  EBL:  Total I/O In: 300 [I.V.:300] Out: 20 [Blood:20]  BLOOD ADMINISTERED:none  DRAINS: none   LOCAL MEDICATIONS USED:  MARCAINE 0.5% with 1:200,000 epinephrine   and LIDOCAINE 2 % with 1:100,000 epinephrine   SPECIMEN:  No Specimen  DISPOSITION OF SPECIMEN:  N/A  COUNTS:  YES  TOURNIQUET:  * No tourniquets in log *  DICTATION: .Note written in EPIC  PLAN OF CARE: Admit to inpatient   PATIENT DISPOSITION:  PACU - hemodynamically stable.   Delay start of Pharmacological VTE agent (>24hrs) due to surgical blood loss or risk of bleeding: not applicable

## 2012-02-20 NOTE — Anesthesia Preprocedure Evaluation (Addendum)
Anesthesia Evaluation  Patient identified by MRN, date of birth, ID band Patient awake    Reviewed: Allergy & Precautions, H&P , NPO status , Patient's Chart, lab work & pertinent test results  Airway       Dental   Pulmonary  breath sounds clear to auscultation        Cardiovascular hypertension, Rate:Normal     Neuro/Psych    GI/Hepatic   Endo/Other  diabetesHypothyroidism   Renal/GU      Musculoskeletal   Abdominal (+) + obese,   Peds  Hematology   Anesthesia Other Findings Unable to assess airway secondary to facial fxs and pain  Reproductive/Obstetrics                           Anesthesia Physical Anesthesia Plan  ASA: III  Anesthesia Plan: General   Post-op Pain Management:    Induction: Intravenous  Airway Management Planned: Video Laryngoscope Planned and Nasal ETT  Additional Equipment:   Intra-op Plan:   Post-operative Plan: Extubation in OR  Informed Consent: I have reviewed the patients History and Physical, chart, labs and discussed the procedure including the risks, benefits and alternatives for the proposed anesthesia with the patient or authorized representative who has indicated his/her understanding and acceptance.     Plan Discussed with: CRNA and Surgeon  Anesthesia Plan Comments:       Anesthesia Quick Evaluation

## 2012-02-20 NOTE — Progress Notes (Signed)
Mckenzie Leonard from [facility] company--phone is [phone].

## 2012-02-20 NOTE — Transfer of Care (Signed)
Immediate Anesthesia Transfer of Care Note  Patient: Mckenzie Leonard  Procedure(s) Performed: Procedure(s) (LRB) with comments: OPEN REDUCTION INTERNAL FIXATION (ORIF) MULTIPLE FACIAL FRACTURES (Right) - ORIF Rt ZMC (zygomaxillary) CLOSED REDUCTION MANDIBULAR (Right) - Closed reduction zygonach arch fx  Patient Location: PACU  Anesthesia Type:General  Level of Consciousness: awake, oriented, sedated and patient cooperative  Airway & Oxygen Therapy: Patient Spontanous Breathing and Patient connected to face mask oxygen  Post-op Assessment: Report given to PACU RN, Post -op Vital signs reviewed and stable and Patient moving all extremities  Post vital signs: Reviewed and stable  Complications: No apparent anesthesia complications

## 2012-02-20 NOTE — Anesthesia Procedure Notes (Signed)
Procedure Name: Intubation Date/Time: 02/20/2012 5:37 PM Performed by: Angelica Pou Pre-anesthesia Checklist: Patient identified, Timeout performed, Emergency Drugs available, Suction available and Patient being monitored Patient Re-evaluated:Patient Re-evaluated prior to inductionOxygen Delivery Method: Circle system utilized Preoxygenation: Pre-oxygenation with 100% oxygen Intubation Type: IV induction Grade View: Grade II Nasal Tubes: Nasal Rae Number of attempts: 2 Airway Equipment and Method: Video-laryngoscopy Placement Confirmation: ETT inserted through vocal cords under direct vision,  positive ETCO2 and breath sounds checked- equal and bilateral Secured at: 26 cm Tube secured with: Tape Dental Injury: Teeth and Oropharynx as per pre-operative assessment  Comments: Pre-O2. RSI. Nasal intubation using glidescope and red rubber catheter.  ETT easily passed through cords, ETT pulled out during tube taping/positioning.  VL repeated, ETT again easily passed through cords. +etCO2, BS=B.

## 2012-02-21 ENCOUNTER — Encounter (HOSPITAL_COMMUNITY): Payer: Self-pay | Admitting: Oral and Maxillofacial Surgery

## 2012-02-21 MED ORDER — HYDROCODONE-ACETAMINOPHEN 10-325 MG PO TABS
0.5000 | ORAL_TABLET | ORAL | Status: DC | PRN
Start: 2012-02-21 — End: 2012-06-26

## 2012-02-21 MED ORDER — AMOXICILLIN-POT CLAVULANATE 875-125 MG PO TABS: 1.0000 | ORAL_TABLET | Freq: Two times a day (BID) | ORAL | Status: DC

## 2012-02-21 NOTE — Progress Notes (Signed)
DC home with family, verbally understood dc instructions, no questions asked,

## 2012-02-21 NOTE — Discharge Summary (Signed)
Looks well.  Home

## 2012-02-21 NOTE — Progress Notes (Signed)
LOS: 3 days   Subjective: Pt feeling okay, minimal pain.  Pt tolerating diet well, no BM yet, urinating regularly.  No new complaints.  Up OOB with PT/OT.  Objective: Vital signs in last 24 hours: Temp:  [98.1 F (36.7 C)-98.7 F (37.1 C)] 98.6 F (37 C) (12/24 0606) Pulse Rate:  [66-88] 81  (12/24 0606) Resp:  [15-20] 18  (12/24 0606) BP: (92-140)/(52-65) 104/56 mmHg (12/24 0630) SpO2:  [90 %-95 %] 92 % (12/24 0606) Last BM Date: 02/17/12  Lab Results:  CBC  Basename 02/19/12 0500  WBC 10.8*  HGB 11.0*  HCT 33.5*  PLT 273   BMET  Basename 02/19/12 0500  NA 138  K 4.8  CL 102  CO2 27  GLUCOSE 129*  BUN 14  CREATININE 0.60  CALCIUM 9.1    Imaging: No results found.   PE: General appearance: alert, cooperative and no distress  Head: periorbital and facial edema worse on right side with periorbital ecchymosis on right, improved from yesterday Eyes: conjunctivae clear, less lid edema Resp: clear to auscultation bilaterally  Cardio: regular rate and rhythm, S1, S2 normal, no murmur, click, rub or gallop  GI: soft, non-tender; bowel sounds normal; no masses, no organomegaly  Extremities: extremities normal, atraumatic, no cyanosis or edema, right shoulder with some tenderness upon palpation and flexion  Skin: ecchymosis in right axilla/back    Assessment/Plan: Blunt head trauma  Multiple right facial fxs -- For ORIF yesterday by Dr. Jeanice Lim  Right temporal skull fx  Right shoulder pain -- xray negative, may need OP Ortho/Rehab  Anxiety -- surrounding returning to work after work accident ABL anemia -- Stable, mild.  DM -- Stable  Mult med probs -- Home meds  FEN -- Orals for pain. VTE -- SCD's  Dispo -- Home today, will give note for no work for 1 week until pt can f/u with Dr. Jeanice Lim unless he has additional recommendations.    Aris Georgia, PA-C Pager: [phone] General Trauma PA Pager: [phone]   02/21/2012

## 2012-02-21 NOTE — Discharge Summary (Signed)
Physician Discharge Summary  Patient ID: Mckenzie Leonard MRN: [id number] DOB/AGE: [birth year] 61 y.o.  Admit date: 02/18/2012 Discharge date: 02/21/2012  Discharge Diagnoses Patient Active Problem List   Diagnosis Date Noted  . Blunt head trauma 02/18/2012  . Multiple fractures of facial bones 02/18/2012  . Temporal skull fracture 02/18/2012  . Hypokalemia 02/18/2012  . Acute blood loss anemia 02/18/2012  . HTN (hypertension) 02/18/2012  . DM (diabetes mellitus) 02/18/2012  . Hyperlipidemia 02/18/2012  . Hypothyroidism 02/18/2012    Consultants Dr. Franky Macho (Neurosurgery) Dr. Jeanice Lim (Oral Surgery)  Procedures Dr. Jeanice Lim (01/21/12): ORIF Right Zygomaticomaxillary Fracture, Closed Reduction of Right Zygomatic Arch Fracture   HPI:  61 y/o female was working this morning about 0630 when a spool came off the machine she was working on and hit her on the right side of her face and right shoulder. She denies LOC and is not amnestic to the event. She fell after being hit but does not think she hurt anything during her fall. She did not come in as a trauma alert. She c/o right facial pain and right shoulder pain.    Hospital Course:  Workup showed fractures of the right maxillary sinus, right zygomatic arch, and right orbital rim with some periorbital air on the right.  Also noted was a slightly depressed fracture of the right temporal calvarium.  Her shoulder xray was negative.  Patient was admitted and underwent procedure listed above on 02/20/12.  Tolerated procedure well and was transferred to the floor.  Diet was advanced as tolerated.  On HD #4, the patient was voiding well, tolerating diet, ambulating well, pain well controlled, vital signs stable, incisions c/d/i and felt stable for discharge home.  Patient will follow up in our office as needed and knows to call with questions or concerns.  She is asked to follow up with Dr. Jeanice Lim in 1-2 weeks and f/u with Dr. Franky Macho prn.        Medication List     As of 02/21/2012  9:51 AM    TAKE these medications         amoxicillin-clavulanate 875-125 MG per tablet   Commonly known as: AUGMENTIN   Take 1 tablet by mouth every 12 (twelve) hours.      diclofenac 75 MG EC tablet   Commonly known as: VOLTAREN   Take 75 mg by mouth 2 (two) times daily.      HYDROcodone-acetaminophen 10-325 MG per tablet   Commonly known as: NORCO   Take 0.5-2 tablets by mouth every 4 (four) hours as needed (1/2 tablet for mild pain, 1 tablet for moderate pain, 2 tablets for severe pain).      levothyroxine 50 MCG tablet   Commonly known as: SYNTHROID, LEVOTHROID   Take 50 mcg by mouth daily.      LIVALO 4 MG Tabs   Generic drug: Pitavastatin Calcium   Take 4 mg by mouth daily.      metFORMIN 500 MG tablet   Commonly known as: GLUCOPHAGE   Take 500 mg by mouth 2 (two) times daily with a meal.      valsartan-hydrochlorothiazide 320-12.5 MG per tablet   Commonly known as: DIOVAN-HCT   Take 1 tablet by mouth daily.             Follow-up Information    Follow up with [place],CHRISTOPHER L, DDS. Schedule an appointment as soon as possible for a visit in 1 week. (Call to make appt for 1 week)  Contact information:   [address] [place], [address] [place] [address] [phone]       Call [facility]. (As needed)    Contact information:   [address] [place] [address] [phone]       Follow up with Bennie Pierini, FNP.   Contact information:   [address] [place] [address] [phone]       Call CABBELL,KYLE L, MD. (As needed)    Contact information:   [address]                         [address] [place] [address] [phone]          Signed: Rueben Bash. [place]ort, [facility]  Trauma Service [phone]  02/21/2012, 9:51 AM

## 2012-02-21 NOTE — Progress Notes (Signed)
Looks well.  Pt seen and agree with PA note

## 2012-02-21 NOTE — Progress Notes (Signed)
Oral & Maxillofacial Surgery  Mckenzie Leonard is a 61 y.o. female patient POD 1 s/p ORIF of right zygomaticomaxillary complex fracture and closed reduction of comminuted right Zygomatic Arch Fracture.   Diagnosis:  1. Fracture, facial bones   2. Fracture of orbit     PMHx:  Past Medical History  Diagnosis Date  . Hypertension   . Diabetes mellitus without complication   . Thyroid disease   . High cholesterol     Meds:  Current Facility-Administered Medications  Medication Dose Route Frequency Provider Last Rate Last Dose  . acetaminophen (TYLENOL) tablet 650 mg  650 mg Oral Q6H PRN Caleen Essex III, MD   650 mg at 02/19/12 0224  . amoxicillin-clavulanate (AUGMENTIN) 875-125 MG per tablet 1 tablet  1 tablet Oral Q12H Francene Finders, DDS   1 tablet at 02/20/12 2252  . atorvastatin (LIPITOR) tablet 20 mg  20 mg Oral q1800 Freeman Caldron, PA   20 mg at 02/19/12 1819  . diclofenac (VOLTAREN) EC tablet 75 mg  75 mg Oral BID Freeman Caldron, PA   75 mg at 02/20/12 2253  . docusate sodium (COLACE) capsule 100 mg  100 mg Oral BID Freeman Caldron, PA   100 mg at 02/20/12 2253  . fentaNYL (SUBLIMAZE) injection 50-100 mcg  50-100 mcg Intravenous Once Bedelia Person, MD      . hydrochlorothiazide (HYDRODIURIL) tablet 12.5 mg  12.5 mg Oral Daily Seyyedeh Saneeymehri, PHARMD   12.5 mg at 02/20/12 1012  . HYDROcodone-acetaminophen (NORCO) 10-325 MG per tablet 0.5-2 tablet  0.5-2 tablet Oral Q4H PRN Freeman Caldron, PA   2 tablet at 02/21/12 0217  . HYDROmorphone (DILAUDID) injection 0.25-0.5 mg  0.25-0.5 mg Intravenous Q5 min PRN Bedelia Person, MD   0.5 mg at 02/20/12 2047  . HYDROmorphone (DILAUDID) injection 0.5 mg  0.5 mg Intravenous Q4H PRN Freeman Caldron, PA   0.5 mg at 02/20/12 2253  . insulin aspart (novoLOG) injection 0-20 Units  0-20 Units Subcutaneous TID WC Freeman Caldron, PA   3 Units at 02/19/12 1819  . irbesartan (AVAPRO) tablet 300 mg  300 mg Oral Daily Seyyedeh  Saneeymehri, PHARMD   300 mg at 02/20/12 1011  . lactated ringers infusion   Intravenous Continuous Bedelia Person, MD 20 mL/hr at 02/20/12 1646    . levothyroxine (SYNTHROID, LEVOTHROID) tablet 50 mcg  50 mcg Oral QAC breakfast Franchot Erichsen, PHARMD   50 mcg at 02/20/12 0839  . metFORMIN (GLUCOPHAGE) tablet 500 mg  500 mg Oral BID WC Freeman Caldron, PA   500 mg at 02/19/12 1819  . midazolam (VERSED) injection 1-2 mg  1-2 mg Intravenous PRN Bedelia Person, MD      . ondansetron [facility]) tablet 4 mg  4 mg Oral Q6H PRN Freeman Caldron, PA       Or  . ondansetron [facility]) injection 4 mg  4 mg Intravenous Q6H PRN Freeman Caldron, PA   4 mg at 02/19/12 0839  . polyethylene glycol (MIRALAX / GLYCOLAX) packet 17 g  17 g Oral Daily Freeman Caldron, PA   17 g at 02/19/12 1014  . sodium chloride 0.45 % 1,000 mL with potassium chloride 40 mEq infusion   Intravenous Continuous Freeman Caldron, PA 75 mL/hr at 02/20/12 1434      Allergies:  Allergies  Allergen Reactions  . Sulfa Antibiotics Anaphylaxis  . Tetanus Toxoids     Reports flu like symptoms and swelling  at injection site only    Problems: Active Problems:  Blunt head trauma  Multiple fractures of facial bones  Temporal skull fracture  Hypokalemia  Acute blood loss anemia  Vitals: BP 104/56  Pulse 81  Temp 98.6 F (37 C) (Oral)  Resp 18  Ht 5\' 3"  (1.6 m)  Wt 80.1 kg (176 lb 9.4 oz)  BMI 31.28 kg/m2  SpO2 92%   Exam:  Right facial swelling has decreased, EOMI intact and no diplopia, PERRLA, CN VII and V grossly intact, occlusion is stable and repeatable.  Patient does report that the right ear feels "full."  She is able to hear well, but it feels as if she is unable to equalize the pressure. The EAC is patent and TM is clear with some dried blood in the 12 o'clock position; however no laceration.   A/P: Patient healing well s/p ORIF Right ZMC, and Closed reduction of Right Comminuted Zygomatic Arch  1. The patient is  stable for Discharge 2. Continue Augmentin 875mg  bid for a total of 10 days, pain medication as needed, and Bacitracin or Neosporin to lateral brow tid. 3. Continue ice to the right face.   4. Sinus precautions (no straws, no forceful sneezing, no blowing of nose) 5. Follow up in my office next Monday for suture removal (call [phone]). 6. If patient continues to have "fullness" in right ear at follow up, I will recommend outpatient follow up with ENT to evaluate ear.  The patient's ear "fullness" is likely a result of intraoral swelling s/p surgery.     Beltrami,Marytza Grandpre L 02/21/2012, 9:49 AM

## 2012-03-05 ENCOUNTER — Ambulatory Visit: Payer: Worker's Compensation | Attending: General Surgery | Admitting: Physical Therapy

## 2012-03-05 DIAGNOSIS — IMO0001 Reserved for inherently not codable concepts without codable children: Secondary | ICD-10-CM | POA: Insufficient documentation

## 2012-03-05 DIAGNOSIS — M25519 Pain in unspecified shoulder: Secondary | ICD-10-CM | POA: Insufficient documentation

## 2012-03-05 DIAGNOSIS — R5381 Other malaise: Secondary | ICD-10-CM | POA: Insufficient documentation

## 2012-03-08 ENCOUNTER — Ambulatory Visit: Payer: Worker's Compensation | Admitting: Physical Therapy

## 2012-03-12 ENCOUNTER — Ambulatory Visit: Payer: Worker's Compensation | Admitting: Physical Therapy

## 2012-03-15 ENCOUNTER — Other Ambulatory Visit (HOSPITAL_COMMUNITY): Payer: Self-pay | Admitting: Oral and Maxillofacial Surgery

## 2012-03-15 ENCOUNTER — Encounter: Payer: Self-pay | Admitting: Physical Therapy

## 2012-03-15 DIAGNOSIS — R220 Localized swelling, mass and lump, head: Secondary | ICD-10-CM

## 2012-03-19 ENCOUNTER — Ambulatory Visit (HOSPITAL_COMMUNITY)
Admission: RE | Admit: 2012-03-19 | Discharge: 2012-03-19 | Disposition: A | Discharge status: Home / Self Care | Payer: Worker's Compensation | Source: Ambulatory Visit | Attending: Oral and Maxillofacial Surgery | Admitting: Oral and Maxillofacial Surgery

## 2012-03-19 ENCOUNTER — Encounter (HOSPITAL_COMMUNITY): Payer: Self-pay

## 2012-03-19 DIAGNOSIS — R220 Localized swelling, mass and lump, head: Secondary | ICD-10-CM | POA: Insufficient documentation

## 2012-03-19 DIAGNOSIS — R51 Headache: Secondary | ICD-10-CM | POA: Insufficient documentation

## 2012-04-13 ENCOUNTER — Other Ambulatory Visit (HOSPITAL_COMMUNITY): Payer: Self-pay | Admitting: Nurse Practitioner

## 2012-04-13 DIAGNOSIS — Z139 Encounter for screening, unspecified: Secondary | ICD-10-CM

## 2012-05-03 ENCOUNTER — Ambulatory Visit (HOSPITAL_COMMUNITY)
Admission: RE | Admit: 2012-05-03 | Discharge: 2012-05-03 | Disposition: A | Discharge status: Home / Self Care | Payer: BC Managed Care – PPO | Source: Ambulatory Visit | Attending: Nurse Practitioner | Admitting: Nurse Practitioner

## 2012-05-03 DIAGNOSIS — Z139 Encounter for screening, unspecified: Secondary | ICD-10-CM

## 2012-05-03 DIAGNOSIS — Z1231 Encounter for screening mammogram for malignant neoplasm of breast: Secondary | ICD-10-CM | POA: Insufficient documentation

## 2012-05-08 ENCOUNTER — Other Ambulatory Visit: Payer: Self-pay | Admitting: Nurse Practitioner

## 2012-05-08 DIAGNOSIS — R928 Other abnormal and inconclusive findings on diagnostic imaging of breast: Secondary | ICD-10-CM

## 2012-05-16 ENCOUNTER — Ambulatory Visit (HOSPITAL_COMMUNITY)
Admission: RE | Admit: 2012-05-16 | Discharge: 2012-05-16 | Disposition: A | Discharge status: Home / Self Care | Payer: BC Managed Care – PPO | Source: Ambulatory Visit | Attending: Nurse Practitioner | Admitting: Nurse Practitioner

## 2012-05-16 DIAGNOSIS — R928 Other abnormal and inconclusive findings on diagnostic imaging of breast: Secondary | ICD-10-CM | POA: Insufficient documentation

## 2012-06-07 ENCOUNTER — Other Ambulatory Visit: Payer: Self-pay | Admitting: Physician Assistant

## 2012-06-07 NOTE — Telephone Encounter (Signed)
LAST VISIT 1/14

## 2012-06-19 ENCOUNTER — Encounter: Payer: Self-pay | Admitting: Gastroenterology

## 2012-06-26 ENCOUNTER — Ambulatory Visit (INDEPENDENT_AMBULATORY_CARE_PROVIDER_SITE_OTHER): Payer: BC Managed Care – PPO | Admitting: Nurse Practitioner

## 2012-06-26 ENCOUNTER — Encounter: Payer: Self-pay | Admitting: Nurse Practitioner

## 2012-06-26 VITALS — BP 147/76 | HR 75 | Temp 97.2°F | Ht 63.0 in | Wt 177.0 lb

## 2012-06-26 DIAGNOSIS — Z0000 Encounter for general adult medical examination without abnormal findings: Secondary | ICD-10-CM

## 2012-06-26 DIAGNOSIS — E119 Type 2 diabetes mellitus without complications: Secondary | ICD-10-CM

## 2012-06-26 DIAGNOSIS — E785 Hyperlipidemia, unspecified: Secondary | ICD-10-CM

## 2012-06-26 DIAGNOSIS — Z01419 Encounter for gynecological examination (general) (routine) without abnormal findings: Secondary | ICD-10-CM

## 2012-06-26 DIAGNOSIS — I10 Essential (primary) hypertension: Secondary | ICD-10-CM

## 2012-06-26 DIAGNOSIS — E039 Hypothyroidism, unspecified: Secondary | ICD-10-CM

## 2012-06-26 LAB — POCT URINALYSIS DIPSTICK
Bilirubin, UA: NEGATIVE
Ketones, UA: NEGATIVE
Leukocytes, UA: NEGATIVE
Nitrite, UA: NEGATIVE
Protein, UA: NEGATIVE
pH, UA: 6

## 2012-06-26 LAB — COMPLETE METABOLIC PANEL WITH GFR
ALT: 18 U/L (ref 0–35)
AST: 17 U/L (ref 0–37)
Alkaline Phosphatase: 70 U/L (ref 39–117)
BUN: 13 mg/dL (ref 6–23)
Calcium: 9.5 mg/dL (ref 8.4–10.5)
Chloride: 102 mEq/L (ref 96–112)
Creat: 0.7 mg/dL (ref 0.50–1.10)
Potassium: 4.7 mEq/L (ref 3.5–5.3)

## 2012-06-26 LAB — THYROID PANEL WITH TSH
Free Thyroxine Index: 2.5 (ref 1.0–3.9)
T3 Uptake: 31.2 % (ref 22.5–37.0)
T4, Total: 8.1 ug/dL (ref 5.0–12.5)
TSH: 1.484 u[IU]/mL (ref 0.350–4.500)

## 2012-06-26 LAB — POCT UA - MICROSCOPIC ONLY
RBC, urine, microscopic: NEGATIVE
WBC, Ur, HPF, POC: NEGATIVE
Yeast, UA: NEGATIVE

## 2012-06-26 LAB — POCT GLYCOSYLATED HEMOGLOBIN (HGB A1C): Hemoglobin A1C: 5.5

## 2012-06-26 NOTE — Progress Notes (Addendum)
Subjective:    Patient ID: Mckenzie Leonard, female    DOB: 10-Jul-1950, 62 y.o.   MRN: [id number]  PATIENT HERE TODAY FOR CPE. CURRENT PROBLEMS Hypertension This is a chronic problem. The current episode started more than 1 year ago. The problem has been waxing and waning since onset. The problem is uncontrolled (runs normal at home 120's systolic). Pertinent negatives include no blurred vision, chest pain, headaches, malaise/fatigue, orthopnea, peripheral edema or shortness of breath. Risk factors for coronary artery disease include dyslipidemia, diabetes mellitus and post-menopausal state. Past treatments include angiotensin blockers and diuretics. The current treatment provides moderate improvement. Compliance problems include diet.  Hypertensive end-organ damage includes a thyroid problem.  Hyperlipidemia This is a chronic problem. The current episode started more than 1 year ago. The problem is controlled. Recent lipid tests were reviewed and are variable. Exacerbating diseases include diabetes. Pertinent negatives include no chest pain or shortness of breath. The current treatment provides moderate improvement of lipids. Compliance problems include adherence to diet and adherence to exercise.  Risk factors for coronary artery disease include diabetes mellitus, hypertension and post-menopausal.  Diabetes She presents for her follow-up diabetic visit. She has type 2 diabetes mellitus. No MedicAlert identification noted. The initial diagnosis of diabetes was made 4 years ago. Her disease course has been stable. There are no hypoglycemic associated symptoms. Pertinent negatives for hypoglycemia include no headaches. Pertinent negatives for diabetes include no blurred vision, no chest pain, no fatigue, no polydipsia, no polyphagia, no polyuria, no weakness and no weight loss. There are no hypoglycemic complications. Symptoms are stable. Risk factors for coronary artery disease include dyslipidemia and  post-menopausal. Current diabetic treatment includes oral agent (monotherapy). She is compliant with treatment all of the time. Her weight is stable. She is following a generally unhealthy diet. When asked about meal planning, she reported none. She has not had a previous visit with a dietician. She participates in exercise every other day. There is no change in her home blood glucose trend. Her breakfast blood glucose is taken between 8-9 am. Her breakfast blood glucose range is generally 110-130 mg/dl. Her overall blood glucose range is 110-130 mg/dl. An ACE inhibitor/angiotensin II receptor blocker is not being taken. She does not see a podiatrist.Eye exam is current (409811).  HYPOTHYROIDISM Doing well . No C/O. No fatigue, dry skin or hair loss    Review of Systems  Constitutional: Negative for weight loss, malaise/fatigue and fatigue.  Eyes: Negative for blurred vision.  Respiratory: Negative for shortness of breath.   Cardiovascular: Negative for chest pain and orthopnea.  Endocrine: Negative for polydipsia, polyphagia and polyuria.  Neurological: Negative for weakness and headaches.       Objective:   Physical Exam  Constitutional: She is oriented to person, place, and time. She appears well-developed and well-nourished.  HENT:  Head: Normocephalic.  Right Ear: Hearing, tympanic membrane, external ear and ear canal normal.  Left Ear: Hearing, tympanic membrane, external ear and ear canal normal.  Nose: Nose normal.  Mouth/Throat: Uvula is midline and oropharynx is clear and moist.  Eyes: Conjunctivae and EOM are normal. Pupils are equal, round, and reactive to light.  Neck: Normal range of motion and full passive range of motion without pain. Neck supple. No JVD present. Carotid bruit is not present. No mass and no thyromegaly present.  Cardiovascular: Normal rate, normal heart sounds and intact distal pulses.   No murmur heard. Pulmonary/Chest: Effort normal and breath sounds  normal. Right breast exhibits no  inverted nipple, no mass, no nipple discharge, no skin change and no tenderness. Left breast exhibits no inverted nipple, no mass, no nipple discharge, no skin change and no tenderness.  Abdominal: Soft. Bowel sounds are normal. She exhibits no mass. There is no tenderness.  Genitourinary: Vagina normal and uterus normal. No breast swelling, tenderness, discharge or bleeding.  bimanual exam-No adnexal masses or tenderness. Cervix parous and pink no discharge  Musculoskeletal: Normal range of motion.  Lymphadenopathy:    She has no cervical adenopathy.  Neurological: She is alert and oriented to person, place, and time.  Skin: Skin is warm and dry.  Red areas on face secondary to tanning bed  Psychiatric: She has a normal mood and affect. Her behavior is normal. Judgment and thought content normal.   BP 147/76  Pulse 75  Temp(Src) 97.2 F (36.2 C) (Oral)  Ht 5\' 3"  (1.6 m)  Wt 177 lb (80.287 kg)  BMI 31.36 kg/m2 Results for orders placed in visit on 06/26/12  POCT UA - MICROSCOPIC ONLY      Result Value Range   WBC, Ur, HPF, POC neg     RBC, urine, microscopic neg     Bacteria, U Microscopic occ     Mucus, UA neg     Epithelial cells, urine per micros occ     Crystals, Ur, HPF, POC neg     Casts, Ur, LPF, POC neg     Yeast, UA neg    POCT URINALYSIS DIPSTICK      Result Value Range   Color, UA straw     Clarity, UA clear     Glucose, UA neg     Bilirubin, UA neg     Ketones, UA neg     Spec Grav, UA 1.015     Blood, UA neg     pH, UA 6.0     Protein, UA neg     Urobilinogen, UA negative     Nitrite, UA neg     Leukocytes, UA Negative    POCT GLYCOSYLATED HEMOGLOBIN (HGB A1C)      Result Value Range   Hemoglobin A1C 5.5            Assessment & Plan:  1. Encounter for routine gynecological examination  - POCT UA - Microscopic Only - POCT urinalysis dipstick  2. Annual physical exam   3. Other and unspecified  hyperlipidemia Low fat diet and exercise  - COMPLETE METABOLIC PANEL WITH GFR - NMR Lipoprofile with Lipids  4. HTN (hypertension) Low NA+diet - COMPLETE METABOLIC PANEL WITH GFR - NMR Lipoprofile with Lipids  5. Diabetes Count carbs - POCT glycosylated hemoglobin (Hb A1C) - COMPLETE METABOLIC PANEL WITH GFR - NMR Lipoprofile with Lipids  6. Unspecified hypothyroidism  - Thyroid Panel With TSH  Continue all meds Labs pending Discussed dangers of tanning bed- encouraged patient to at least cover her face when in tanning bed F/U in 3 months  Mary-Margaret Daphine Deutscher, FNP

## 2012-06-26 NOTE — Addendum Note (Signed)
Addended by: Bennie Pierini on: 06/26/2012 10:06 AM   Modules accepted: Orders

## 2012-06-26 NOTE — Patient Instructions (Signed)

## 2012-06-27 LAB — NMR LIPOPROFILE WITH LIPIDS
Cholesterol, Total: 188 mg/dL (ref <200)
HDL Particle Number: 38.4 umol/L (ref >=30.5)
LDL (calc): 87 mg/dL (ref <100)
LDL Particle Number: 1355 nmol/L — ABNORMAL HIGH (ref <1000)
LP-IR Score: 70 — ABNORMAL HIGH (ref <=45)
Triglycerides: 258 mg/dL — ABNORMAL HIGH (ref <150)
VLDL Size: 49.5 nm — ABNORMAL HIGH (ref <=46.6)

## 2012-06-28 LAB — PAP IG, CT-NG, RFX HPV ASCU

## 2012-08-03 ENCOUNTER — Other Ambulatory Visit: Payer: Self-pay | Admitting: Nurse Practitioner

## 2012-09-14 ENCOUNTER — Other Ambulatory Visit: Payer: Self-pay | Admitting: *Deleted

## 2012-09-14 MED ORDER — PITAVASTATIN CALCIUM 4 MG PO TABS: 4.0000 mg | ORAL_TABLET | Freq: Every day | ORAL | Status: DC

## 2012-10-05 ENCOUNTER — Other Ambulatory Visit: Payer: Self-pay | Admitting: *Deleted

## 2012-10-05 MED ORDER — VALSARTAN-HYDROCHLOROTHIAZIDE 320-12.5 MG PO TABS: 1.0000 | ORAL_TABLET | Freq: Every day | ORAL | Status: DC

## 2012-10-16 ENCOUNTER — Other Ambulatory Visit: Payer: Self-pay | Admitting: Nurse Practitioner

## 2012-10-17 NOTE — Telephone Encounter (Signed)
Last seen 03/13/12  ACm

## 2012-11-03 ENCOUNTER — Other Ambulatory Visit: Payer: Self-pay | Admitting: Family Medicine

## 2012-11-06 NOTE — Telephone Encounter (Signed)
Last seen 03/13/12 ACM     

## 2012-11-07 NOTE — Telephone Encounter (Signed)
Patient needs to be seen. Has exceeded time since last visit. Needs to bring all medications to next appointment.   

## 2012-11-09 ENCOUNTER — Other Ambulatory Visit: Payer: Self-pay | Admitting: *Deleted

## 2012-11-09 MED ORDER — VALSARTAN-HYDROCHLOROTHIAZIDE 320-12.5 MG PO TABS: ORAL_TABLET | ORAL | Status: DC

## 2012-11-12 NOTE — Telephone Encounter (Signed)
Pharmacy notified of refll

## 2012-11-12 NOTE — Telephone Encounter (Signed)
rx called to [facility]  

## 2012-11-19 ENCOUNTER — Other Ambulatory Visit: Payer: Self-pay | Admitting: Nurse Practitioner

## 2012-11-20 NOTE — Telephone Encounter (Signed)
Last seen 04/14 

## 2012-12-08 ENCOUNTER — Other Ambulatory Visit: Payer: Self-pay | Admitting: Family Medicine

## 2012-12-08 ENCOUNTER — Other Ambulatory Visit: Payer: Self-pay | Admitting: Nurse Practitioner

## 2012-12-10 NOTE — Telephone Encounter (Signed)
Last seen 03/13/12 ACM     

## 2012-12-11 NOTE — Telephone Encounter (Signed)
Patient needs to be seen. Has exceeded time since last visit. Needs to bring all medications to next appointment. Last refill. 

## 2012-12-17 ENCOUNTER — Other Ambulatory Visit: Payer: Self-pay | Admitting: Nurse Practitioner

## 2012-12-19 ENCOUNTER — Other Ambulatory Visit: Payer: Self-pay | Admitting: Nurse Practitioner

## 2012-12-31 ENCOUNTER — Ambulatory Visit (INDEPENDENT_AMBULATORY_CARE_PROVIDER_SITE_OTHER): Payer: BC Managed Care – PPO | Admitting: Family Medicine

## 2012-12-31 VITALS — BP 141/84 | HR 69 | Temp 97.9°F | Ht 63.0 in | Wt 176.0 lb

## 2012-12-31 DIAGNOSIS — I10 Essential (primary) hypertension: Secondary | ICD-10-CM

## 2012-12-31 DIAGNOSIS — E039 Hypothyroidism, unspecified: Secondary | ICD-10-CM

## 2012-12-31 DIAGNOSIS — M129 Arthropathy, unspecified: Secondary | ICD-10-CM

## 2012-12-31 DIAGNOSIS — E785 Hyperlipidemia, unspecified: Secondary | ICD-10-CM

## 2012-12-31 DIAGNOSIS — E119 Type 2 diabetes mellitus without complications: Secondary | ICD-10-CM

## 2012-12-31 DIAGNOSIS — M1990 Unspecified osteoarthritis, unspecified site: Secondary | ICD-10-CM

## 2012-12-31 LAB — POCT CBC
Granulocyte percent: 71.1 %G (ref 37–80)
HCT, POC: 41.8 % (ref 37.7–47.9)
Hemoglobin: 13.5 g/dL (ref 12.2–16.2)
Lymph, poc: 1.7 (ref 0.6–3.4)
MCH, POC: 27.7 pg (ref 27–31.2)
MCHC: 32.2 g/dL (ref 31.8–35.4)
MCV: 86.1 fL (ref 80–97)
MPV: 8.1 fL (ref 0–99.8)
POC Granulocyte: 5.1 (ref 2–6.9)
POC LYMPH PERCENT: 23.3 %L (ref 10–50)
Platelet Count, POC: 265 10*3/uL (ref 142–424)
RBC: 4.9 M/uL (ref 4.04–5.48)
RDW, POC: 14.8 %
WBC: 7.2 10*3/uL (ref 4.6–10.2)

## 2012-12-31 LAB — POCT GLYCOSYLATED HEMOGLOBIN (HGB A1C): Hemoglobin A1C: 5.5

## 2012-12-31 MED ORDER — DICLOFENAC SODIUM 75 MG PO TBEC: 75.0000 mg | DELAYED_RELEASE_TABLET | Freq: Two times a day (BID) | ORAL | Status: DC

## 2012-12-31 MED ORDER — VALSARTAN-HYDROCHLOROTHIAZIDE 320-12.5 MG PO TABS: 1.0000 | ORAL_TABLET | Freq: Every day | ORAL | Status: DC

## 2012-12-31 MED ORDER — PITAVASTATIN CALCIUM 4 MG PO TABS: 4.0000 mg | ORAL_TABLET | Freq: Every morning | ORAL | Status: DC

## 2012-12-31 MED ORDER — LEVOTHYROXINE SODIUM 50 MCG PO TABS: 50.0000 ug | ORAL_TABLET | Freq: Every day | ORAL | Status: DC

## 2012-12-31 NOTE — Patient Instructions (Signed)

## 2012-12-31 NOTE — Progress Notes (Signed)
  Subjective:    Patient ID: Mckenzie Leonard, female    DOB: 1950-07-27, 62 y.o.   MRN: [id number]  HPI  This 62 y.o. female presents for evaluation of hypertension, hyperlipidemia, Diabetes and hypothyroidism.  She states she has stopped her metformin due To GI sx's and her fasting glucose in the am before eating are 120's.  She needs Refills..  Review of Systems No chest pain, SOB, HA, dizziness, vision change, N/V, diarrhea, constipation, dysuria, urinary urgency or frequency, myalgias, arthralgias or rash.     Objective:   Physical Exam  Vital signs noted  Well developed well nourished efmale.  HEENT - Head atraumatic Normocephalic                Eyes - PERRLA, Conjuctiva - clear Sclera- Clear EOMI                Ears - EAC's Wnl TM's Wnl Gross Hearing WNL                Nose - Nares patent                 Throat - oropharanx wnl Respiratory - Lungs CTA bilateral Cardiac - RRR S1 and S2 without murmur GI - Abdomen soft Nontender and bowel sounds active x 4 Extremities - No edema. Neuro - Grossly intact.      Assessment & Plan:  Essential hypertension, benign - Plan: valsartan-hydrochlorothiazide (DIOVAN-HCT) 320-12.5 MG per tablet, POCT CBC, CMP14+EGFR  Other and unspecified hyperlipidemia - Plan: Pitavastatin Calcium (LIVALO) 4 MG TABS, Lipid panel  Arthritis - Plan: diclofenac (VOLTAREN) 75 MG EC tablet  Unspecified hypothyroidism - Plan: levothyroxine (SYNTHROID, LEVOTHROID) 50 MCG tablet, Thyroid Panel With TSH  Diabetes - Plan: POCT glycosylated hemoglobin (Hb A1C).  Discussed with patient that if hgbaic is normal then would tx diabetes with diet and exercise.  Deatra Canter FNP

## 2013-01-01 LAB — LIPID PANEL
Chol/HDL Ratio: 4 ratio units (ref 0.0–4.4)
Cholesterol, Total: 201 mg/dL — ABNORMAL HIGH (ref 100–199)
HDL: 50 mg/dL (ref >39)
LDL Calculated: 107 mg/dL — ABNORMAL HIGH (ref 0–99)
Triglycerides: 219 mg/dL — ABNORMAL HIGH (ref 0–149)
VLDL Cholesterol Cal: 44 mg/dL — ABNORMAL HIGH (ref 5–40)

## 2013-01-01 LAB — THYROID PANEL WITH TSH
Free Thyroxine Index: 1.9 (ref 1.2–4.9)
T3 Uptake Ratio: 30 % (ref 24–39)
T4, Total: 6.4 ug/dL (ref 4.5–12.0)
TSH: 0.822 u[IU]/mL (ref 0.450–4.500)

## 2013-01-01 LAB — CMP14+EGFR
ALT: 21 IU/L (ref 0–32)
AST: 17 IU/L (ref 0–40)
Albumin/Globulin Ratio: 1.7 (ref 1.1–2.5)
Albumin: 4.6 g/dL (ref 3.6–4.8)
Alkaline Phosphatase: 68 IU/L (ref 39–117)
BUN/Creatinine Ratio: 25 (ref 11–26)
BUN: 16 mg/dL (ref 8–27)
CO2: 24 mmol/L (ref 18–29)
Calcium: 9.3 mg/dL (ref 8.6–10.2)
Chloride: 97 mmol/L (ref 97–108)
Creatinine, Ser: 0.65 mg/dL (ref 0.57–1.00)
GFR calc Af Amer: 109 mL/min/{1.73_m2} (ref >59)
GFR calc non Af Amer: 94 mL/min/{1.73_m2} (ref >59)
Globulin, Total: 2.7 g/dL (ref 1.5–4.5)
Glucose: 101 mg/dL — ABNORMAL HIGH (ref 65–99)
Potassium: 4.3 mmol/L (ref 3.5–5.2)
Sodium: 141 mmol/L (ref 134–144)
Total Bilirubin: 0.8 mg/dL (ref 0.0–1.2)
Total Protein: 7.3 g/dL (ref 6.0–8.5)

## 2013-01-08 ENCOUNTER — Telehealth: Payer: Self-pay | Admitting: Family Medicine

## 2013-01-08 NOTE — Telephone Encounter (Signed)
Pt notified and results left on pt's cell phone and advised to call if questions

## 2013-01-15 ENCOUNTER — Other Ambulatory Visit: Payer: Self-pay | Admitting: Nurse Practitioner

## 2013-02-13 ENCOUNTER — Other Ambulatory Visit: Payer: Self-pay

## 2013-02-13 DIAGNOSIS — E039 Hypothyroidism, unspecified: Secondary | ICD-10-CM

## 2013-02-13 MED ORDER — LEVOTHYROXINE SODIUM 50 MCG PO TABS: 50.0000 ug | ORAL_TABLET | Freq: Every day | ORAL | Status: DC

## 2013-03-14 ENCOUNTER — Other Ambulatory Visit: Payer: Self-pay | Admitting: Nurse Practitioner

## 2013-03-14 ENCOUNTER — Encounter: Payer: Self-pay | Admitting: Nurse Practitioner

## 2013-03-14 ENCOUNTER — Ambulatory Visit (INDEPENDENT_AMBULATORY_CARE_PROVIDER_SITE_OTHER): Payer: BC Managed Care – PPO | Admitting: Nurse Practitioner

## 2013-03-14 ENCOUNTER — Telehealth: Payer: Self-pay | Admitting: Nurse Practitioner

## 2013-03-14 VITALS — BP 128/83 | HR 86 | Temp 98.6°F | Ht 63.0 in | Wt 177.0 lb

## 2013-03-14 DIAGNOSIS — J322 Chronic ethmoidal sinusitis: Secondary | ICD-10-CM

## 2013-03-14 MED ORDER — AZITHROMYCIN 250 MG PO TABS: ORAL_TABLET | ORAL | Status: DC

## 2013-03-14 NOTE — Patient Instructions (Signed)

## 2013-03-14 NOTE — Telephone Encounter (Signed)
appt made

## 2013-03-14 NOTE — Progress Notes (Signed)
   Subjective:    Patient ID: Mckenzie Leonard, female    DOB: Aug 27, 1950, 63 y.o.   MRN: [id number]  HPI  Patient went to urgent care several days after christmas and was dx with sinus infection- Was given augmentin and flonase. Finished antiobiotic 1 1/2 weeks ago- started feeling bad again 2 days ago- Has a bad taste in moth and an odor in nose just like she did prior to going to urgent care.    Review of Systems  Constitutional: Negative for fever and chills.  HENT: Positive for congestion, rhinorrhea and sinus pressure.   Respiratory: Negative for cough.   Cardiovascular: Negative.   Gastrointestinal: Negative.   Genitourinary: Negative.   Musculoskeletal: Negative.   Neurological: Positive for headaches.  All other systems reviewed and are negative.       Objective:   Physical Exam  Constitutional: She appears well-developed and well-nourished.  HENT:  Right Ear: Hearing, tympanic membrane, external ear and ear canal normal.  Left Ear: Hearing, tympanic membrane, external ear and ear canal normal.  Nose: Mucosal edema and rhinorrhea present. Right sinus exhibits frontal sinus tenderness. Right sinus exhibits no maxillary sinus tenderness. Left sinus exhibits frontal sinus tenderness. Left sinus exhibits no maxillary sinus tenderness.  Mouth/Throat: Uvula is midline, oropharynx is clear and moist and mucous membranes are normal.  Eyes: Pupils are equal, round, and reactive to light.  Neck: Normal range of motion. Neck supple.  Cardiovascular: Normal rate, regular rhythm and normal heart sounds.   Pulmonary/Chest: Effort normal and breath sounds normal.  Lymphadenopathy:    She has no cervical adenopathy.  Neurological: She is alert.  Skin: Skin is warm.    BP 128/83  Pulse 86  Temp(Src) 98.6 F (37 C) (Oral)  Ht 5\' 3"  (1.6 m)  Wt 177 lb (80.287 kg)  BMI 31.36 kg/m2       Assessment & Plan:   1. Ethmoid sinusitis    Meds ordered this encounter  Medications  .  azithromycin (ZITHROMAX Z-PAK) 250 MG tablet    Sig: As directed    Dispense:  6 each    Refill:  0    Order Specific Question:  Supervising Provider    Answer:  Chipper Herb [[id number]]   1. Take meds as prescribed 2. Use a cool mist humidifier especially during the winter months and when heat has been humid. 3. Use saline nose sprays frequently 4. Saline irrigations of the nose can be very helpful if done frequently.  * 4X daily for 1 week*  * Use of a nettie pot can be helpful with this. Follow directions with this* 5. Drink plenty of fluids 6. Keep thermostat turn down low 7.For any cough or congestion  Use plain Mucinex- regular strength or max strength is fine   * Children- consult with Pharmacist for dosing 8. For fever or aces or pains- take tylenol or ibuprofen appropriate for age and weight.  * for fevers greater than 101 orally you may alternate ibuprofen and tylenol every  3 hours.  Mary-Margaret Hassell Done, FNP

## 2013-03-23 ENCOUNTER — Other Ambulatory Visit: Payer: Self-pay | Admitting: Nurse Practitioner

## 2013-03-27 ENCOUNTER — Other Ambulatory Visit: Payer: Self-pay | Admitting: Nurse Practitioner

## 2013-03-28 ENCOUNTER — Telehealth: Payer: Self-pay | Admitting: Nurse Practitioner

## 2013-03-29 NOTE — Telephone Encounter (Signed)
Pt had 11 refills from November, but [facility] couldn't find, called in 3 refills

## 2013-05-06 ENCOUNTER — Other Ambulatory Visit: Payer: Self-pay | Admitting: Nurse Practitioner

## 2013-05-06 DIAGNOSIS — Z1231 Encounter for screening mammogram for malignant neoplasm of breast: Secondary | ICD-10-CM

## 2013-05-14 ENCOUNTER — Ambulatory Visit (HOSPITAL_COMMUNITY)
Admission: RE | Admit: 2013-05-14 | Discharge: 2013-05-14 | Disposition: A | Discharge status: Home / Self Care | Payer: BC Managed Care – PPO | Source: Ambulatory Visit | Attending: Nurse Practitioner | Admitting: Nurse Practitioner

## 2013-05-14 DIAGNOSIS — Z1231 Encounter for screening mammogram for malignant neoplasm of breast: Secondary | ICD-10-CM | POA: Insufficient documentation

## 2013-06-19 ENCOUNTER — Other Ambulatory Visit: Payer: Self-pay | Admitting: Nurse Practitioner

## 2013-07-18 ENCOUNTER — Other Ambulatory Visit: Payer: Self-pay | Admitting: Family Medicine

## 2013-07-28 ENCOUNTER — Encounter: Payer: Self-pay | Admitting: Gastroenterology

## 2013-08-13 ENCOUNTER — Encounter: Payer: BC Managed Care – PPO | Admitting: Nurse Practitioner

## 2013-08-16 ENCOUNTER — Other Ambulatory Visit: Payer: Self-pay | Admitting: Nurse Practitioner

## 2013-09-16 ENCOUNTER — Ambulatory Visit (INDEPENDENT_AMBULATORY_CARE_PROVIDER_SITE_OTHER): Payer: BC Managed Care – PPO | Admitting: Nurse Practitioner

## 2013-09-16 ENCOUNTER — Encounter: Payer: Self-pay | Admitting: Nurse Practitioner

## 2013-09-16 VITALS — BP 128/70 | HR 82 | Temp 98.4°F | Ht 63.0 in | Wt 186.0 lb

## 2013-09-16 DIAGNOSIS — I10 Essential (primary) hypertension: Secondary | ICD-10-CM

## 2013-09-16 DIAGNOSIS — E039 Hypothyroidism, unspecified: Secondary | ICD-10-CM

## 2013-09-16 DIAGNOSIS — Z6832 Body mass index (BMI) 32.0-32.9, adult: Secondary | ICD-10-CM

## 2013-09-16 DIAGNOSIS — E119 Type 2 diabetes mellitus without complications: Secondary | ICD-10-CM

## 2013-09-16 DIAGNOSIS — Z0000 Encounter for general adult medical examination without abnormal findings: Secondary | ICD-10-CM

## 2013-09-16 DIAGNOSIS — E876 Hypokalemia: Secondary | ICD-10-CM

## 2013-09-16 DIAGNOSIS — E785 Hyperlipidemia, unspecified: Secondary | ICD-10-CM

## 2013-09-16 DIAGNOSIS — E559 Vitamin D deficiency, unspecified: Secondary | ICD-10-CM

## 2013-09-16 DIAGNOSIS — Z713 Dietary counseling and surveillance: Secondary | ICD-10-CM

## 2013-09-16 LAB — POCT GLYCOSYLATED HEMOGLOBIN (HGB A1C)

## 2013-09-16 NOTE — Progress Notes (Signed)
Subjective:    Patient ID: Mckenzie Leonard, female    DOB: Apr 09, 1950, 63 y.o.   MRN: [id number]  Patient here today for annual physical- she is doing well today without complaints.  Hyperlipidemia This is a chronic problem. The current episode started more than 1 year ago. The problem is uncontrolled. Recent lipid tests were reviewed and are high. Exacerbating diseases include diabetes and obesity. She has no history of hypothyroidism. Pertinent negatives include no chest pain or shortness of breath. Current antihyperlipidemic treatment includes diet change. The current treatment provides mild improvement of lipids. Compliance problems include adherence to diet and adherence to exercise.   Hypertension This is a chronic problem. The current episode started more than 1 year ago. The problem is unchanged. The problem is controlled. Pertinent negatives include no anxiety, chest pain, headaches, palpitations, peripheral edema or shortness of breath. Agents associated with hypertension include thyroid hormones. Risk factors for coronary artery disease include diabetes mellitus, dyslipidemia, family history, post-menopausal state and sedentary lifestyle. Past treatments include angiotensin blockers and diuretics. The current treatment provides moderate improvement. Compliance problems include diet and exercise.  Hypertensive end-organ damage includes a thyroid problem.  Diabetes She presents for her follow-up diabetic visit. She has type 2 diabetes mellitus. No MedicAlert identification noted. Her disease course has been stable. Pertinent negatives for hypoglycemia include no headaches. Pertinent negatives for diabetes include no chest pain. There are no hypoglycemic complications. Symptoms are stable. There are no diabetic complications. Risk factors for coronary artery disease include diabetes mellitus, dyslipidemia, family history, hypertension, obesity and post-menopausal. When asked about current  treatments, none (last Hgba1c was 5.5 % so stopped metformin because was causing diarrhea) were reported. She is compliant with treatment most of the time. Her weight is stable. She is following a generally healthy diet. When asked about meal planning, she reported none. She has not had a previous visit with a dietician. She rarely participates in exercise. There is no change in her home blood glucose trend. Her breakfast blood glucose is taken between 8-9 am. Her breakfast blood glucose range is generally 110-130 mg/dl. An ACE inhibitor/angiotensin II receptor blocker is being taken. She does not see a podiatrist.Eye exam is not current.  Thyroid Problem Presents for follow-up (hypoothyroidism) visit. Patient reports no palpitations. The symptoms have been stable. Her past medical history is significant for diabetes and hyperlipidemia.  Hypokalemia Has history but os currently on no meds.  NO complainrt of lower ext cramping   Review of Systems  Constitutional: Negative.   Respiratory: Negative for shortness of breath.   Cardiovascular: Negative for chest pain and palpitations.  Neurological: Negative for headaches.  Psychiatric/Behavioral: Negative.   All other systems reviewed and are negative.      Objective:   Physical Exam  Constitutional: She is oriented to person, place, and time. She appears well-developed and well-nourished.  HENT:  Nose: Nose normal.  Mouth/Throat: Oropharynx is clear and moist.  Eyes: EOM are normal.  Neck: Trachea normal, normal range of motion and full passive range of motion without pain. Neck supple. No JVD present. Carotid bruit is not present. No thyromegaly present.  Cardiovascular: Normal rate, regular rhythm, normal heart sounds and intact distal pulses.  Exam reveals no gallop and no friction rub.   No murmur heard. Pulmonary/Chest: Effort normal and breath sounds normal.  Abdominal: Soft. Bowel sounds are normal. She exhibits no distension and no  mass. There is no tenderness.  Musculoskeletal: Normal range of motion.  Lymphadenopathy:  She has no cervical adenopathy.  Neurological: She is alert and oriented to person, place, and time. She has normal reflexes.  Skin: Skin is warm and dry.  Psychiatric: She has a normal mood and affect. Her behavior is normal. Judgment and thought content normal.    BP 128/70  Pulse 82  Temp(Src) 98.4 F (36.9 C) (Oral)  Ht $R'5\' 3"'fx$  (1.6 m)  Wt 186 lb (84.369 kg)  BMI 32.96 kg/m2 Results for orders placed in visit on 09/16/13  POCT GLYCOSYLATED HEMOGLOBIN (HGB A1C)      Result Value Ref Range   Hemoglobin A1C 5.4%           Assessment & Plan:   1. Annual physical exam   2. Type 2 diabetes mellitus without complication   3. Essential hypertension   4. Hyperlipidemia   5. Hypothyroidism, unspecified hypothyroidism type   6. Vitamin D deficiency   7. Hypokalemia   8. BMI 32.0-32.9,adult   9. Weight loss counseling, encounter for    Orders Placed This Encounter  Procedures  . CMP14+EGFR  . NMR, lipoprofile  . Vit D  25 hydroxy (rtn osteoporosis monitoring)  . Thyroid Panel With TSH  . POCT glycosylated hemoglobin (Hb A1C)     Labs pending Health maintenance reviewed Diet and exercise encouraged Continue all meds Follow up  In 6 months   [place], FNP

## 2013-09-16 NOTE — Patient Instructions (Signed)

## 2013-09-17 ENCOUNTER — Other Ambulatory Visit: Payer: Self-pay | Admitting: Nurse Practitioner

## 2013-09-17 LAB — NMR, LIPOPROFILE
Cholesterol: 260 mg/dL — ABNORMAL HIGH (ref 100–199)
HDL CHOLESTEROL BY NMR: 45 mg/dL (ref >39)
HDL Particle Number: 34.7 umol/L (ref >=30.5)
LDL Particle Number: 2313 nmol/L — ABNORMAL HIGH (ref <1000)
LDL Size: 19.8 nm (ref >20.5)
LDLC SERPL CALC-MCNC: 165 mg/dL — AB (ref 0–99)
LP-IR Score: 69 — ABNORMAL HIGH (ref <=45)
SMALL LDL PARTICLE NUMBER: 1719 nmol/L — AB (ref <=527)
TRIGLYCERIDES BY NMR: 252 mg/dL — AB (ref 0–149)

## 2013-09-17 LAB — CMP14+EGFR
ALT: 20 IU/L (ref 0–32)
AST: 19 IU/L (ref 0–40)
Albumin/Globulin Ratio: 1.9 (ref 1.1–2.5)
Albumin: 4.8 g/dL (ref 3.6–4.8)
Alkaline Phosphatase: 64 IU/L (ref 39–117)
BUN/Creatinine Ratio: 22 (ref 11–26)
BUN: 16 mg/dL (ref 8–27)
CALCIUM: 9.5 mg/dL (ref 8.7–10.3)
CO2: 24 mmol/L (ref 18–29)
CREATININE: 0.72 mg/dL (ref 0.57–1.00)
Chloride: 98 mmol/L (ref 97–108)
GFR calc Af Amer: 102 mL/min/{1.73_m2} (ref >59)
GFR calc non Af Amer: 89 mL/min/{1.73_m2} (ref >59)
GLOBULIN, TOTAL: 2.5 g/dL (ref 1.5–4.5)
GLUCOSE: 92 mg/dL (ref 65–99)
Potassium: 4.6 mmol/L (ref 3.5–5.2)
Sodium: 140 mmol/L (ref 134–144)
TOTAL PROTEIN: 7.3 g/dL (ref 6.0–8.5)
Total Bilirubin: 0.6 mg/dL (ref 0.0–1.2)

## 2013-09-17 LAB — THYROID PANEL WITH TSH
FREE THYROXINE INDEX: 2.3 (ref 1.2–4.9)
T3 UPTAKE RATIO: 29 % (ref 24–39)
T4, Total: 8.1 ug/dL (ref 4.5–12.0)
TSH: 1.23 u[IU]/mL (ref 0.450–4.500)

## 2013-09-17 LAB — VITAMIN D 25 HYDROXY (VIT D DEFICIENCY, FRACTURES): Vit D, 25-Hydroxy: 44.5 ng/mL (ref 30.0–100.0)

## 2013-09-17 MED ORDER — ATORVASTATIN CALCIUM 40 MG PO TABS: 40.0000 mg | ORAL_TABLET | Freq: Every day | ORAL | Status: DC

## 2013-10-12 ENCOUNTER — Other Ambulatory Visit: Payer: Self-pay | Admitting: Nurse Practitioner

## 2013-11-11 ENCOUNTER — Other Ambulatory Visit: Payer: Self-pay | Admitting: *Deleted

## 2013-11-11 MED ORDER — DICLOFENAC SODIUM 75 MG PO TBEC: DELAYED_RELEASE_TABLET | ORAL | Status: DC

## 2013-11-11 NOTE — Telephone Encounter (Signed)
Last ov 7/15. 

## 2013-12-08 ENCOUNTER — Other Ambulatory Visit: Payer: Self-pay | Admitting: Family Medicine

## 2013-12-26 ENCOUNTER — Ambulatory Visit (INDEPENDENT_AMBULATORY_CARE_PROVIDER_SITE_OTHER): Payer: Medicare Other | Admitting: Nurse Practitioner

## 2013-12-26 ENCOUNTER — Encounter: Payer: Self-pay | Admitting: Nurse Practitioner

## 2013-12-26 VITALS — BP 151/82 | HR 76 | Temp 97.8°F | Ht 63.0 in | Wt 190.6 lb

## 2013-12-26 DIAGNOSIS — I10 Essential (primary) hypertension: Secondary | ICD-10-CM

## 2013-12-26 DIAGNOSIS — E785 Hyperlipidemia, unspecified: Secondary | ICD-10-CM

## 2013-12-26 DIAGNOSIS — Z6832 Body mass index (BMI) 32.0-32.9, adult: Secondary | ICD-10-CM

## 2013-12-26 DIAGNOSIS — E119 Type 2 diabetes mellitus without complications: Secondary | ICD-10-CM

## 2013-12-26 DIAGNOSIS — E559 Vitamin D deficiency, unspecified: Secondary | ICD-10-CM

## 2013-12-26 DIAGNOSIS — E039 Hypothyroidism, unspecified: Secondary | ICD-10-CM

## 2013-12-26 LAB — POCT GLYCOSYLATED HEMOGLOBIN (HGB A1C)

## 2013-12-26 MED ORDER — CELEBREX 200 MG PO CAPS
200.0000 mg | ORAL_CAPSULE | Freq: Two times a day (BID) | ORAL | Status: DC
Start: 2013-12-26 — End: 2013-12-30

## 2013-12-26 NOTE — Patient Instructions (Signed)
Diabetes and Exercise Exercising regularly is important. It is not just about losing weight. It has many health benefits, such as:  Improving your overall fitness, flexibility, and endurance.  Increasing your bone density.  Helping with weight control.  Decreasing your body fat.  Increasing your muscle strength.  Reducing stress and tension.  Improving your overall health. People with diabetes who exercise gain additional benefits because exercise:  Reduces appetite.  Improves the body's use of blood sugar (glucose).  Helps lower or control blood glucose.  Decreases blood pressure.  Helps control blood lipids (such as cholesterol and triglycerides).  Improves the body's use of the hormone insulin by:  Increasing the body's insulin sensitivity.  Reducing the body's insulin needs.  Decreases the risk for heart disease because exercising:  Lowers cholesterol and triglycerides levels.  Increases the levels of good cholesterol (such as high-density lipoproteins [HDL]) in the body.  Lowers blood glucose levels. YOUR ACTIVITY PLAN  Choose an activity that you enjoy and set realistic goals. Your health care provider or diabetes educator can help you make an activity plan that works for you. Exercise regularly as directed by your health care provider. This includes:  Performing resistance training twice a week such as push-ups, sit-ups, lifting weights, or using resistance bands.  Performing 150 minutes of cardio exercises each week such as walking, running, or playing sports.  Staying active and spending no more than 90 minutes at one time being inactive. Even short bursts of exercise are good for you. Three 10-minute sessions spread throughout the day are just as beneficial as a single 30-minute session. Some exercise ideas include:  Taking the dog for a walk.  Taking the stairs instead of the elevator.  Dancing to your favorite song.  Doing an exercise  video.  Doing your favorite exercise with a friend. RECOMMENDATIONS FOR EXERCISING WITH TYPE 1 OR TYPE 2 DIABETES   Check your blood glucose before exercising. If blood glucose levels are greater than 240 mg/dL, check for urine ketones. Do not exercise if ketones are present.  Avoid injecting insulin into areas of the body that are going to be exercised. For example, avoid injecting insulin into:  The arms when playing tennis.  The legs when jogging.  Keep a record of:  Food intake before and after you exercise.  Expected peak times of insulin action.  Blood glucose levels before and after you exercise.  The type and amount of exercise you have done.  Review your records with your health care provider. Your health care provider will help you to develop guidelines for adjusting food intake and insulin amounts before and after exercising.  If you take insulin or oral hypoglycemic agents, watch for signs and symptoms of hypoglycemia. They include:  Dizziness.  Shaking.  Sweating.  Chills.  Confusion.  Drink plenty of water while you exercise to prevent dehydration or heat stroke. Body water is lost during exercise and must be replaced.  Talk to your health care provider before starting an exercise program to make sure it is safe for you. Remember, almost any type of activity is better than none. Document Released: 05/07/2003 Document Revised: 07/01/2013 Document Reviewed: 07/24/2012 [employer] Patient Information 2015 [employer]. This information is not intended to replace advice given to you by your health care provider. Make sure you discuss any questions you have with your health care provider.  

## 2013-12-26 NOTE — Progress Notes (Signed)
Subjective:    Patient ID: Mckenzie Leonard, female    DOB: 05/03/1950, 63 y.o.   MRN: [id number]  Patient here today for chronic disease follow up, no acute complaint.   Hyperlipidemia This is a chronic problem. The current episode started more than 1 year ago. The problem is uncontrolled. Recent lipid tests were reviewed and are high. Exacerbating diseases include diabetes and obesity. She has no history of hypothyroidism. Pertinent negatives include no chest pain or shortness of breath. Current antihyperlipidemic treatment includes diet change. The current treatment provides mild improvement of lipids. Compliance problems include adherence to diet and adherence to exercise.   Hypertension This is a chronic problem. The current episode started more than 1 year ago. The problem is unchanged. The problem is controlled. Pertinent negatives include no anxiety, chest pain, headaches, palpitations, peripheral edema or shortness of breath. Agents associated with hypertension include thyroid hormones. Risk factors for coronary artery disease include diabetes mellitus, dyslipidemia, family history, post-menopausal state and sedentary lifestyle. Past treatments include angiotensin blockers and diuretics. The current treatment provides moderate improvement. Compliance problems include diet and exercise.  Hypertensive end-organ damage includes a thyroid problem.  Diabetes She presents for her follow-up diabetic visit. She has type 2 diabetes mellitus. No MedicAlert identification noted. Her disease course has been stable. Pertinent negatives for hypoglycemia include no headaches. Pertinent negatives for diabetes include no chest pain. There are no hypoglycemic complications. Symptoms are stable. There are no diabetic complications. Risk factors for coronary artery disease include diabetes mellitus, dyslipidemia, family history, hypertension, obesity and post-menopausal. When asked about current treatments, none (last  Hgba1c was 5.5 % so stopped metformin because was causing diarrhea) were reported. She is compliant with treatment most of the time. Her weight is stable. She is following a generally healthy diet. When asked about meal planning, she reported none. She has not had a previous visit with a dietician. She rarely participates in exercise. There is no change in her home blood glucose trend. Her breakfast blood glucose is taken between 8-9 am. Her breakfast blood glucose range is generally 110-130 mg/dl. An ACE inhibitor/angiotensin II receptor blocker is being taken. She does not see a podiatrist.Eye exam is not current.  Thyroid Problem Presents for follow-up (hypoothyroidism) visit. Patient reports no palpitations. The symptoms have been stable. Her past medical history is significant for diabetes and hyperlipidemia.  Hypokalemia Has history but os currently on no meds.  NO complainrt of lower ext cramping   Review of Systems  Constitutional: Negative.   Respiratory: Negative for shortness of breath.   Cardiovascular: Negative for chest pain and palpitations.  Neurological: Negative for headaches.  Psychiatric/Behavioral: Negative.   All other systems reviewed and are negative.      Objective:   Physical Exam  Constitutional: She is oriented to person, place, and time. She appears well-developed and well-nourished.  HENT:  Nose: Nose normal.  Mouth/Throat: Oropharynx is clear and moist.  Eyes: EOM are normal.  Neck: Trachea normal, normal range of motion and full passive range of motion without pain. Neck supple. No JVD present. Carotid bruit is not present. No thyromegaly present.  Cardiovascular: Normal rate, regular rhythm, normal heart sounds and intact distal pulses.  Exam reveals no gallop and no friction rub.   No murmur heard. Pulmonary/Chest: Effort normal and breath sounds normal.  Abdominal: Soft. Bowel sounds are normal. She exhibits no distension and no mass. There is no  tenderness.  Musculoskeletal: Normal range of motion.  Lymphadenopathy:  She has no cervical adenopathy.  Neurological: She is alert and oriented to person, place, and time. She has normal reflexes.  Skin: Skin is warm and dry.  Psychiatric: She has a normal mood and affect. Her behavior is normal. Judgment and thought content normal.    BP 151/82  Pulse 76  Temp(Src) 97.8 F (36.6 C) (Oral)  Ht 5' 3" (1.6 m)  Wt 190 lb 9.6 oz (86.456 kg)  BMI 33.77 kg/m2   Results for orders placed in visit on 12/26/13  POCT GLYCOSYLATED HEMOGLOBIN (HGB A1C)      Result Value Ref Range   Hemoglobin A1C 5.7%           Assessment & Plan:   1. Hyperlipidemia Low fat diet - CMP14+EGFR - NMR, lipoprofile  2. Hypothyroidism, unspecified hypothyroidism type - Thyroid Panel With TSH  3. Type 2 diabetes mellitus without complication Continue to watch carbs in diet - POCT glycosylated hemoglobin (Hb A1C)  4. Essential hypertension Low NA diet  5. Vitamin D deficiency  6. BMI 32.0-32.9,adult Discussed diet and exercise for person with BMI >25 Will recheck weight in 3-6 months_0   Refuses all immunizations Labs pending Health maintenance reviewed Diet and exercise encouraged Continue all meds Follow up  In 3 months [place], FNP

## 2013-12-27 LAB — CMP14+EGFR
A/G RATIO: 1.8 (ref 1.1–2.5)
ALT: 38 IU/L — AB (ref 0–32)
AST: 25 IU/L (ref 0–40)
Albumin: 4.8 g/dL (ref 3.6–4.8)
Alkaline Phosphatase: 70 IU/L (ref 39–117)
BILIRUBIN TOTAL: 0.7 mg/dL (ref 0.0–1.2)
BUN/Creatinine Ratio: 21 (ref 11–26)
BUN: 15 mg/dL (ref 8–27)
CALCIUM: 9.6 mg/dL (ref 8.7–10.3)
CO2: 27 mmol/L (ref 18–29)
Chloride: 98 mmol/L (ref 97–108)
Creatinine, Ser: 0.7 mg/dL (ref 0.57–1.00)
GFR calc Af Amer: 105 mL/min/{1.73_m2} (ref >59)
GFR, EST NON AFRICAN AMERICAN: 91 mL/min/{1.73_m2} (ref >59)
Globulin, Total: 2.7 g/dL (ref 1.5–4.5)
Glucose: 91 mg/dL (ref 65–99)
POTASSIUM: 4.2 mmol/L (ref 3.5–5.2)
SODIUM: 142 mmol/L (ref 134–144)
Total Protein: 7.5 g/dL (ref 6.0–8.5)

## 2013-12-27 LAB — THYROID PANEL WITH TSH
Free Thyroxine Index: 2.6 (ref 1.2–4.9)
T3 Uptake Ratio: 30 % (ref 24–39)
T4 TOTAL: 8.8 ug/dL (ref 4.5–12.0)
TSH: 1.66 u[IU]/mL (ref 0.450–4.500)

## 2013-12-27 LAB — NMR, LIPOPROFILE
Cholesterol: 143 mg/dL (ref 100–199)
HDL CHOLESTEROL BY NMR: 43 mg/dL (ref >39)
HDL Particle Number: 37.7 umol/L (ref >=30.5)
LDL Particle Number: 806 nmol/L (ref <1000)
LDL SIZE: 20.1 nm (ref >20.5)
LDL-C: 53 mg/dL (ref 0–99)
LP-IR Score: 77 — ABNORMAL HIGH (ref <=45)
SMALL LDL PARTICLE NUMBER: 509 nmol/L (ref <=527)
Triglycerides by NMR: 233 mg/dL — ABNORMAL HIGH (ref 0–149)

## 2013-12-30 ENCOUNTER — Other Ambulatory Visit: Payer: Self-pay | Admitting: Nurse Practitioner

## 2013-12-30 ENCOUNTER — Telehealth: Payer: Self-pay | Admitting: *Deleted

## 2013-12-30 MED ORDER — MELOXICAM 15 MG PO TABS: 15.0000 mg | ORAL_TABLET | Freq: Every day | ORAL | Status: DC

## 2013-12-30 NOTE — Telephone Encounter (Signed)
Received fax from pharmacy stating that Celebrex was going to cost $96. Patient can no longer use card because she has medicare. Pharmacy wants to know if this can be changed to meloxicam? Please advise

## 2013-12-30 NOTE — Telephone Encounter (Signed)
Already addressed

## 2014-01-10 ENCOUNTER — Encounter: Payer: Self-pay | Admitting: Pharmacist

## 2014-01-10 ENCOUNTER — Ambulatory Visit (INDEPENDENT_AMBULATORY_CARE_PROVIDER_SITE_OTHER): Payer: Medicare Other | Admitting: Pharmacist

## 2014-01-10 VITALS — BP 138/78 | HR 80 | Ht 63.0 in | Wt 191.0 lb

## 2014-01-10 DIAGNOSIS — E034 Atrophy of thyroid (acquired): Secondary | ICD-10-CM

## 2014-01-10 DIAGNOSIS — Z79899 Other long term (current) drug therapy: Secondary | ICD-10-CM

## 2014-01-10 DIAGNOSIS — Z0000 Encounter for general adult medical examination without abnormal findings: Secondary | ICD-10-CM

## 2014-01-10 MED ORDER — BLOOD GLUCOSE MONITOR KIT: PACK | Status: DC

## 2014-01-10 MED ORDER — BLOOD GLUCOSE MONITOR KIT: PACK | Status: AC

## 2014-01-10 MED ORDER — BLOOD GLUCOSE TEST VI STRP: ORAL_STRIP | Status: DC

## 2014-01-10 NOTE — Patient Instructions (Signed)
I have sent in referral for DEXA / bone density scan - if you do not hear about an appointment by December 1st please call me. Please call [phone]).   Preventive Care for Adults A healthy lifestyle and preventive care can promote health and wellness. Preventive health guidelines for women include the following key practices.  A routine yearly physical is a good way to check with your health care provider about your health and preventive screening. It is a chance to share any concerns and updates on your health and to receive a thorough exam.  Visit your dentist for a routine exam and preventive care every 6 months. Brush your teeth twice a day and floss once a day. Good oral hygiene prevents tooth decay and gum disease.  The frequency of eye exams is based on your age, health, family medical history, use of contact lenses, and other factors. Follow your health care provider's recommendations for frequency of eye exams.  Eat a healthy diet. Foods like vegetables, fruits, whole grains, low-fat dairy products, and lean protein foods contain the nutrients you need without too many calories. Decrease your intake of foods high in solid fats, added sugars, and salt. Eat the right amount of calories for you.Get information about a proper diet from your health care provider, if necessary.  Regular physical exercise is one of the most important things you can do for your health. Most adults should get at least 150 minutes of moderate-intensity exercise (any activity that increases your heart rate and causes you to sweat) each week. In addition, most adults need muscle-strengthening exercises on 2 or more days a week.  Maintain a healthy weight. The body mass index (BMI) is a screening tool to identify possible weight problems. It provides an estimate of body fat based on height and weight. Your health care provider can find your BMI and can help you achieve or maintain a healthy weight.For adults 20  years and older:  A BMI below 18.5 is considered underweight.  A BMI of 18.5 to 24.9 is normal.  A BMI of 25 to 29.9 is considered overweight.  A BMI of 30 and above is considered obese.  Maintain normal blood lipids and cholesterol levels by exercising and minimizing your intake of saturated fat. Eat a balanced diet with plenty of fruit and vegetables. Blood tests for lipids and cholesterol should begin at age 48 and be repeated every 5 years. If your lipid or cholesterol levels are high, you are over 50, or you are at high risk for heart disease, you may need your cholesterol levels checked more frequently.Ongoing high lipid and cholesterol levels should be treated with medicines if diet and exercise are not working.  If you smoke, find out from your health care provider how to quit. If you do not use tobacco, do not start.  Lung cancer screening is recommended for adults aged 55-80 years who are at high risk for developing lung cancer because of a history of smoking. A yearly low-dose CT scan of the lungs is recommended for people who have at least a 30-pack-year history of smoking and are a current smoker or have quit within the past 15 years. A pack year of smoking is smoking an average of 1 pack of cigarettes a day for 1 year (for example: 1 pack a day for 30 years or 2 packs a day for 15 years). Yearly screening should continue until the smoker has stopped smoking for at least 15 years. Yearly screening should  be stopped for people who develop a health problem that would prevent them from having lung cancer treatment.  If you are pregnant, do not drink alcohol. If you are breastfeeding, be very cautious about drinking alcohol. If you are not pregnant and choose to drink alcohol, do not have more than 1 drink per day. One drink is considered to be 12 ounces (355 mL) of beer, 5 ounces (148 mL) of wine, or 1.5 ounces (44 mL) of liquor.  Avoid use of street drugs. Do not share needles with  anyone. Ask for help if you need support or instructions about stopping the use of drugs.  High blood pressure causes heart disease and increases the risk of stroke. Your blood pressure should be checked at least every 1 to 2 years. Ongoing high blood pressure should be treated with medicines if weight loss and exercise do not work.  If you are 55-9 years old, ask your health care provider if you should take aspirin to prevent strokes.  Diabetes screening involves taking a blood sample to check your fasting blood sugar level. This should be done once every 3 years, after age 62, if you are within normal weight and without risk factors for diabetes. Testing should be considered at a younger age or be carried out more frequently if you are overweight and have at least 1 risk factor for diabetes.  Breast cancer screening is essential preventive care for women. You should practice "breast self-awareness." This means understanding the normal appearance and feel of your breasts and may include breast self-examination. Any changes detected, no matter how small, should be reported to a health care provider. Women in their 86s and 30s should have a clinical breast exam (CBE) by a health care provider as part of a regular health exam every 1 to 3 years. After age 45, women should have a CBE every year. Starting at age 82, women should consider having a mammogram (breast X-ray test) every year. Women who have a family history of breast cancer should talk to their health care provider about genetic screening. Women at a high risk of breast cancer should talk to their health care providers about having an MRI and a mammogram every year.  Breast cancer gene (BRCA)-related cancer risk assessment is recommended for women who have family members with BRCA-related cancers. BRCA-related cancers include breast, ovarian, tubal, and peritoneal cancers. Having family members with these cancers may be associated with an  increased risk for harmful changes (mutations) in the breast cancer genes BRCA1 and BRCA2. Results of the assessment will determine the need for genetic counseling and BRCA1 and BRCA2 testing.  Routine pelvic exams to screen for cancer are no longer recommended for nonpregnant women who are considered low risk for cancer of the pelvic organs (ovaries, uterus, and vagina) and who do not have symptoms. Ask your health care provider if a screening pelvic exam is right for you.  If you have had past treatment for cervical cancer or a condition that could lead to cancer, you need Pap tests and screening for cancer for at least 20 years after your treatment. If Pap tests have been discontinued, your risk factors (such as having a new sexual partner) need to be reassessed to determine if screening should be resumed. Some women have medical problems that increase the chance of getting cervical cancer. In these cases, your health care provider may recommend more frequent screening and Pap tests.  The HPV test is an additional test that may  be used for cervical cancer screening. The HPV test looks for the virus that can cause the cell changes on the cervix. The cells collected during the Pap test can be tested for HPV. The HPV test could be used to screen women aged 29 years and older, and should be used in women of any age who have unclear Pap test results. After the age of 49, women should have HPV testing at the same frequency as a Pap test.  Colorectal cancer can be detected and often prevented. Most routine colorectal cancer screening begins at the age of 11 years and continues through age 58 years. However, your health care provider may recommend screening at an earlier age if you have risk factors for colon cancer. On a yearly basis, your health care provider may provide home test kits to check for hidden blood in the stool. Use of a small camera at the end of a tube, to directly examine the colon  (sigmoidoscopy or colonoscopy), can detect the earliest forms of colorectal cancer. Talk to your health care provider about this at age 45, when routine screening begins. Direct exam of the colon should be repeated every 5-10 years through age 36 years, unless early forms of pre-cancerous polyps or small growths are found.  People who are at an increased risk for hepatitis B should be screened for this virus. You are considered at high risk for hepatitis B if:  You were born in a country where hepatitis B occurs often. Talk with your health care provider about which countries are considered high risk.  Your parents were born in a high-risk country and you have not received a shot to protect against hepatitis B (hepatitis B vaccine).  You have HIV or AIDS.  You use needles to inject street drugs.  You live with, or have sex with, someone who has hepatitis B.  You get hemodialysis treatment.  You take certain medicines for conditions like cancer, organ transplantation, and autoimmune conditions.  Hepatitis C blood testing is recommended for all people born from [birth year] through 1965 and any individual with known risks for hepatitis C.  Practice safe sex. Use condoms and avoid high-risk sexual practices to reduce the spread of sexually transmitted infections (STIs). STIs include gonorrhea, chlamydia, syphilis, trichomonas, herpes, HPV, and human immunodeficiency virus (HIV). Herpes, HIV, and HPV are viral illnesses that have no cure. They can result in disability, cancer, and death.  You should be screened for sexually transmitted illnesses (STIs) including gonorrhea and chlamydia if:  You are sexually active and are younger than 24 years.  You are older than 24 years and your health care provider tells you that you are at risk for this type of infection.  Your sexual activity has changed since you were last screened and you are at an increased risk for chlamydia or gonorrhea. Ask your health  care provider if you are at risk.  If you are at risk of being infected with HIV, it is recommended that you take a prescription medicine daily to prevent HIV infection. This is called preexposure prophylaxis (PrEP). You are considered at risk if:  You are a heterosexual woman, are sexually active, and are at increased risk for HIV infection.  You take drugs by injection.  You are sexually active with a partner who has HIV.  Talk with your health care provider about whether you are at high risk of being infected with HIV. If you choose to begin PrEP, you should first be tested  for HIV. You should then be tested every 3 months for as long as you are taking PrEP.  Osteoporosis is a disease in which the bones lose minerals and strength with aging. This can result in serious bone fractures or breaks. The risk of osteoporosis can be identified using a bone density scan. Women ages 34 years and over and women at risk for fractures or osteoporosis should discuss screening with their health care providers. Ask your health care provider whether you should take a calcium supplement or vitamin D to reduce the rate of osteoporosis.  Menopause can be associated with physical symptoms and risks. Hormone replacement therapy is available to decrease symptoms and risks. You should talk to your health care provider about whether hormone replacement therapy is right for you.  Use sunscreen. Apply sunscreen liberally and repeatedly throughout the day. You should seek shade when your shadow is shorter than you. Protect yourself by wearing long sleeves, pants, a wide-brimmed hat, and sunglasses year round, whenever you are outdoors.  Once a month, do a whole body skin exam, using a mirror to look at the skin on your back. Tell your health care provider of new moles, moles that have irregular borders, moles that are larger than a pencil eraser, or moles that have changed in shape or color.  Stay current with required  vaccines (immunizations).  Influenza vaccine. All adults should be immunized every year.  Tetanus, diphtheria, and acellular pertussis (Td, Tdap) vaccine. Pregnant women should receive 1 dose of Tdap vaccine during each pregnancy. The dose should be obtained regardless of the length of time since the last dose. Immunization is preferred during the 27th-36th week of gestation. An adult who has not previously received Tdap or who does not know her vaccine status should receive 1 dose of Tdap. This initial dose should be followed by tetanus and diphtheria toxoids (Td) booster doses every 10 years. Adults with an unknown or incomplete history of completing a 3-dose immunization series with Td-containing vaccines should begin or complete a primary immunization series including a Tdap dose. Adults should receive a Td booster every 10 years.  Varicella vaccine. An adult without evidence of immunity to varicella should receive 2 doses or a second dose if she has previously received 1 dose. Pregnant females who do not have evidence of immunity should receive the first dose after pregnancy. This first dose should be obtained before leaving the health care facility. The second dose should be obtained 4-8 weeks after the first dose.  Human papillomavirus (HPV) vaccine. Females aged 13-26 years who have not received the vaccine previously should obtain the 3-dose series. The vaccine is not recommended for use in pregnant females. However, pregnancy testing is not needed before receiving a dose. If a female is found to be pregnant after receiving a dose, no treatment is needed. In that case, the remaining doses should be delayed until after the pregnancy. Immunization is recommended for any person with an immunocompromised condition through the age of 23 years if she did not get any or all doses earlier. During the 3-dose series, the second dose should be obtained 4-8 weeks after the first dose. The third dose should be  obtained 24 weeks after the first dose and 16 weeks after the second dose.  Zoster vaccine. One dose is recommended for adults aged 48 years or older unless certain conditions are present.  Measles, mumps, and rubella (MMR) vaccine. Adults born before 65 generally are considered immune to measles and mumps.  Adults born in [birth year] or later should have 1 or more doses of MMR vaccine unless there is a contraindication to the vaccine or there is laboratory evidence of immunity to each of the three diseases. A routine second dose of MMR vaccine should be obtained at least 28 days after the first dose for students attending postsecondary schools, health care workers, or international travelers. People who received inactivated measles vaccine or an unknown type of measles vaccine during 1963-1967 should receive 2 doses of MMR vaccine. People who received inactivated mumps vaccine or an unknown type of mumps vaccine before 1979 and are at high risk for mumps infection should consider immunization with 2 doses of MMR vaccine. For females of childbearing age, rubella immunity should be determined. If there is no evidence of immunity, females who are not pregnant should be vaccinated. If there is no evidence of immunity, females who are pregnant should delay immunization until after pregnancy. Unvaccinated health care workers born before [birth year] who lack laboratory evidence of measles, mumps, or rubella immunity or laboratory confirmation of disease should consider measles and mumps immunization with 2 doses of MMR vaccine or rubella immunization with 1 dose of MMR vaccine.  Pneumococcal 13-valent conjugate (PCV13) vaccine. When indicated, a person who is uncertain of her immunization history and has no record of immunization should receive the PCV13 vaccine. An adult aged 76 years or older who has certain medical conditions and has not been previously immunized should receive 1 dose of PCV13 vaccine. This PCV13 should be  followed with a dose of pneumococcal polysaccharide (PPSV23) vaccine. The PPSV23 vaccine dose should be obtained at least 8 weeks after the dose of PCV13 vaccine. An adult aged 58 years or older who has certain medical conditions and previously received 1 or more doses of PPSV23 vaccine should receive 1 dose of PCV13. The PCV13 vaccine dose should be obtained 1 or more years after the last PPSV23 vaccine dose.  Pneumococcal polysaccharide (PPSV23) vaccine. When PCV13 is also indicated, PCV13 should be obtained first. All adults aged 66 years and older should be immunized. An adult younger than age 76 years who has certain medical conditions should be immunized. Any person who resides in a nursing home or long-term care facility should be immunized. An adult smoker should be immunized. People with an immunocompromised condition and certain other conditions should receive both PCV13 and PPSV23 vaccines. People with human immunodeficiency virus (HIV) infection should be immunized as soon as possible after diagnosis. Immunization during chemotherapy or radiation therapy should be avoided. Routine use of PPSV23 vaccine is not recommended for American Indians, [place] Natives, or people younger than 65 years unless there are medical conditions that require PPSV23 vaccine. When indicated, people who have unknown immunization and have no record of immunization should receive PPSV23 vaccine. One-time revaccination 5 years after the first dose of PPSV23 is recommended for people aged 19-64 years who have chronic kidney failure, nephrotic syndrome, asplenia, or immunocompromised conditions. People who received 1-2 doses of PPSV23 before age 60 years should receive another dose of PPSV23 vaccine at age 70 years or later if at least 5 years have passed since the previous dose. Doses of PPSV23 are not needed for people immunized with PPSV23 at or after age 41 years.  Meningococcal vaccine. Adults with asplenia or persistent  complement component deficiencies should receive 2 doses of quadrivalent meningococcal conjugate (MenACWY-D) vaccine. The doses should be obtained at least 2 months apart. Microbiologists working with certain meningococcal bacteria, [employer] recruits,  people at risk during an outbreak, and people who travel to or live in countries with a high rate of meningitis should be immunized. A first-year college student up through age 23 years who is living in a residence hall should receive a dose if she did not receive a dose on or after her 16th birthday. Adults who have certain high-risk conditions should receive one or more doses of vaccine.  Hepatitis A vaccine. Adults who wish to be protected from this disease, have certain high-risk conditions, work with hepatitis A-infected animals, work in hepatitis A research labs, or travel to or work in countries with a high rate of hepatitis A should be immunized. Adults who were previously unvaccinated and who anticipate close contact with an international adoptee during the first 60 days after arrival in the [place] from a country with a high rate of hepatitis A should be immunized.  Hepatitis B vaccine. Adults who wish to be protected from this disease, have certain high-risk conditions, may be exposed to blood or other infectious body fluids, are household contacts or sex partners of hepatitis B positive people, are clients or workers in certain care facilities, or travel to or work in countries with a high rate of hepatitis B should be immunized.  Haemophilus influenzae type b (Hib) vaccine. A previously unvaccinated person with asplenia or sickle cell disease or having a scheduled splenectomy should receive 1 dose of Hib vaccine. Regardless of previous immunization, a recipient of a hematopoietic stem cell transplant should receive a 3-dose series 6-12 months after her successful transplant. Hib vaccine is not recommended for adults with HIV  infection. Preventive Services / Frequency Ages 43 years and over  Blood pressure check.** / Every 1 to 2 years.  Lipid and cholesterol check.** / Every 5 years beginning at age 85 years.  Lung cancer screening. / Every year if you are aged 15-80 years and have a 30-pack-year history of smoking and currently smoke or have quit within the past 15 years. Yearly screening is stopped once you have quit smoking for at least 15 years or develop a health problem that would prevent you from having lung cancer treatment.  Clinical breast exam.** / Every year after age 19 years.  BRCA-related cancer risk assessment.** / For women who have family members with a BRCA-related cancer (breast, ovarian, tubal, or peritoneal cancers).  Mammogram.** / Every year beginning at age 64 years and continuing for as long as you are in good health. Consult with your health care provider.  Pap test.** / Every 3 years starting at age 65 years through age 80 or 12 years with 3 consecutive normal Pap tests. Testing can be stopped between 65 and 70 years with 3 consecutive normal Pap tests and no abnormal Pap or HPV tests in the past 10 years.  HPV screening.** / Every 3 years from ages 55 years through ages 74 or 66 years with a history of 3 consecutive normal Pap tests. Testing can be stopped between 65 and 70 years with 3 consecutive normal Pap tests and no abnormal Pap or HPV tests in the past 10 years.  Fecal occult blood test (FOBT) of stool. / Every year beginning at age 25 years and continuing until age 47 years. You may not need to do this test if you get a colonoscopy every 10 years.  Flexible sigmoidoscopy or colonoscopy.** / Every 5 years for a flexible sigmoidoscopy or every 10 years for a colonoscopy beginning at age 4 years and  continuing until age 61 years.  Hepatitis C blood test.** / For all people born from [birth year] through 1967 and any individual with known risks for hepatitis C.  Osteoporosis  screening.** / A one-time screening for women ages 56 years and over and women at risk for fractures or osteoporosis.  Skin self-exam. / Monthly.  Influenza vaccine. / Every year.  Tetanus, diphtheria, and acellular pertussis (Tdap/Td) vaccine.** / 1 dose of Td every 10 years.  Varicella vaccine.** / Consult your health care provider.  Zoster vaccine.** / 1 dose for adults aged 36 years or older.  Pneumococcal 13-valent conjugate (PCV13) vaccine.** / Consult your health care provider.  Pneumococcal polysaccharide (PPSV23) vaccine.** / 1 dose for all adults aged 12 years and older.  Meningococcal vaccine.** / Consult your health care provider.  Hepatitis A vaccine.** / Consult your health care provider.  Hepatitis B vaccine.** / Consult your health care provider.  Haemophilus influenzae type b (Hib) vaccine.** / Consult your health care provider. ** Family history and personal history of risk and conditions may change your health care provider's recommendations. Document Released: 04/12/2001 Document Revised: 07/01/2013 Document Reviewed: 07/12/2010 [facility] Patient Information 2015 [place], [place]. This information is not intended to replace advice given to you by your health care provider. Make sure you discuss any questions you have with your health care provider.

## 2014-01-10 NOTE — Progress Notes (Signed)
Patient ID: Mckenzie Leonard, female   DOB: [birth year], 63 y.o.   MRN: [id number] Subjective:    Mckenzie Leonard is a 63 y.o. female who presents for Medicare Initial Wellness Visit  Preventive Screening-Counseling & Management  Tobacco History  Smoking status  . Never Smoker   Smokeless tobacco  . Never Used    Current Problems (verified) Patient Active Problem List   Diagnosis Date Noted  . BMI 32.0-32.9,adult 09/16/2013  . Vitamin D deficiency 09/16/2013  . Blunt head trauma 02/18/2012  . Multiple fractures of facial bones 02/18/2012  . Temporal skull fracture 02/18/2012  . Hypokalemia 02/18/2012  . HTN (hypertension) 02/18/2012  . Type 2 diabetes mellitus, controlled 02/18/2012  . Hyperlipidemia 02/18/2012  . Hypothyroidism 02/18/2012    Medications Prior to Visit Current Outpatient Prescriptions on File Prior to Visit  Medication Sig Dispense Refill  . atorvastatin (LIPITOR) 40 MG tablet Take 1 tablet (40 mg total) by mouth daily. 90 tablet 1  . cholecalciferol (VITAMIN D) 1000 UNITS tablet Take 1,000 Units by mouth daily.    . Glucosamine-MSM-Hyaluronic Acd (JOINT HEALTH PO) Take 1 capsule by mouth daily.    Marland Kitchen levothyroxine (SYNTHROID, LEVOTHROID) 50 MCG tablet Take 1 tablet (50 mcg total) by mouth daily. 30 tablet 11  . meloxicam (MOBIC) 15 MG tablet Take 1 tablet (15 mg total) by mouth daily. 30 tablet 2  . Multiple Vitamin (MULTIVITAMIN WITH MINERALS) TABS tablet Take 1 tablet by mouth daily.    . Omega-3 Fatty Acids (FISH OIL) 1000 MG CAPS Take 1 capsule by mouth daily.    . valsartan-hydrochlorothiazide (DIOVAN-HCT) 320-12.5 MG per tablet TAKE ONE TABLET BY MOUTH ONE TIME DAILY 30 tablet 2   No current facility-administered medications on file prior to visit.    Current Medications (verified) Current Outpatient Prescriptions  Medication Sig Dispense Refill  . atorvastatin (LIPITOR) 40 MG tablet Take 1 tablet (40 mg total) by mouth daily. 90 tablet 1  .  cholecalciferol (VITAMIN D) 1000 UNITS tablet Take 1,000 Units by mouth daily.    . Glucosamine-MSM-Hyaluronic Acd (JOINT HEALTH PO) Take 1 capsule by mouth daily.    Marland Kitchen levothyroxine (SYNTHROID, LEVOTHROID) 50 MCG tablet Take 1 tablet (50 mcg total) by mouth daily. 30 tablet 11  . meloxicam (MOBIC) 15 MG tablet Take 1 tablet (15 mg total) by mouth daily. 30 tablet 2  . Multiple Vitamin (MULTIVITAMIN WITH MINERALS) TABS tablet Take 1 tablet by mouth daily.    . Omega-3 Fatty Acids (FISH OIL) 1000 MG CAPS Take 1 capsule by mouth daily.    . valsartan-hydrochlorothiazide (DIOVAN-HCT) 320-12.5 MG per tablet TAKE ONE TABLET BY MOUTH ONE TIME DAILY 30 tablet 2  . Blood Glucose Monitoring Suppl (BLOOD GLUCOSE METER KIT AND SUPPLIES) KIT Dispense based on patient and insurance preference. Use to check BG once daily.  ICD 10 code = E11.9 1 each 0  . Glucose Blood (BLOOD GLUCOSE TEST STRIPS) STRP Use to check blood glucose once daily.  Dx:  E11.9 100 each 3   No current facility-administered medications for this visit.     Allergies (verified) Sulfa antibiotics; Statins; and Tetanus toxoids   PAST HISTORY  Family History Family History  Problem Relation Age of Onset  . Arthritis Mother   . Heart disease Mother     enlarged heart  . Anemia Mother   . Stroke Mother   . Arthritis Father   . Diabetes Father   . Hypertension Father     Social History History  Substance Use Topics  . Smoking status: Never Smoker   . Smokeless tobacco: Never Used  . Alcohol Use: No     Are there smokers in your home (other than you)? No  Risk Factors Current exercise habits: Home exercise routine includes walking dialy but has decreased recently due to knee pain  Dietary issues discussed: limiting CHO intake  Cardiac risk factors: advanced age (older than 34 for men, 33 for women), diabetes mellitus, dyslipidemia, family history of premature cardiovascular disease, hypertension and obesity (BMI >= 30  kg/m2).  Depression Screen (Note: if answer to either of the following is "Yes", a more complete depression screening is indicated)   Over the past 2 weeks, have you felt down, depressed or hopeless? No  Over the past 2 weeks, have you felt little interest or pleasure in doing things? No  Have you lost interest or pleasure in daily life? No  Do you often feel hopeless? No  Do you cry easily over simple problems? No  Activities of Daily Living In your present state of health, do you have any difficulty performing the following activities?:  Driving? No Managing money?  No Feeding yourself? No Getting from bed to chair? No  Climbing a flight of stairs? No Preparing food and eating?: No Bathing or showering? No Getting dressed: No Getting to the toilet? No Using the toilet:No Moving around from place to place: No In the past year have you fallen or had a near fall?:No   Are you sexually active?  Yes  Do you have more than one partner?  No  Hearing Difficulties: No Do you often ask people to speak up or repeat themselves? No Do you experience ringing or noises in your ears? No Do you have difficulty understanding soft or whispered voices? No   Do you feel that you have a problem with memory? No  Do you often misplace items? No  Do you feel safe at home?  Yes  Cognitive Testing  Alert? Yes Normal Appearance?Yes  Oriented to person? Yes  Place? Yes  Time? Yes  Recall of three objects?  Yes  Can perform simple calculations? Yes  Displays appropriate judgment?Yes  Can read the correct time from a watch face?Yes   Advanced Directives have been discussed with the patient? Yes  List the Names of Other Physician/Practitioners you currently use: 1.  optometrist - Mayford Knife, DO 2.  Orthopedist - Dr Ilda Basset any recent Medical Services you may have received from other than [facility] providers in the past year (date may be approximate).  Immunization History   Administered Date(s) Administered  . Tdap 02/18/2012    Screening Tests Health Maintenance  Topic Date Due  . OPHTHALMOLOGY EXAM  01/10/2014  . COLONOSCOPY  03/19/2014 (Originally 06/28/2012)  . ZOSTAVAX  03/19/2014 (Originally 11/09/2008)  . INFLUENZA VACCINE  12/27/2014 (Originally 09/28/2013)  . PNEUMOCOCCAL POLYSACCHARIDE VACCINE AGE 30 AND OVER  12/27/2014 (Originally 11/09/2013)  . HEMOGLOBIN A1C  06/27/2014  . URINE MICROALBUMIN  09/17/2014  . FOOT EXAM  12/27/2014  . MAMMOGRAM  05/15/2015  . TETANUS/TDAP  02/17/2022    All answers were reviewed with the patient and necessary referrals were made:  Cherre Robins, [facility], [facility]   01/10/2014   History reviewed: allergies, current medications, past family history, past medical history, past social history, past surgical history and problem list  Review of Systems A comprehensive review of systems was negative except for: Musculoskeletal: positive for stiff joints and knee pain  Objective:  Body mass index is 33.84 kg/(m^2). BP 138/78 mmHg  Pulse 80  Ht $R'5\' 3"'ah$  (1.6 m)  Wt 191 lb (86.637 kg)  BMI 33.84 kg/m2   Assessment:     Subsequent Medicare Wellness Visit Type 2 Diabetes - controlled with diet     Plan:     During the course of the visit the patient was educated and counseled about appropriate screening and preventive services including:    Pneumococcal vaccine - patient declined  Influenza vaccine - patient declined  Hepatitis B vaccine -patient declined  Td vaccine - UTD  Screening mammography - UTD  Screening Pap smear and pelvic exam - UTD - due every 2 years   Bone densitometry screening - referral made today  Colorectal cancer screening - refuses colonoscopy;  hemocult given in office today  Glaucoma screening - has appt with Dr Hassell Done in 1 month  Advanced directives: has NO advanced directive - not interested in additional information  Patient given Rx for glucometer, test strips and lancets -  to check BG up to qd.   Patient Instructions (the written plan) was given to the patient.  Medicare Attestation I have personally reviewed: The patient's medical and social history Their use of alcohol, tobacco or illicit drugs Their current medications and supplements The patient's functional ability including ADLs,fall risks, home safety risks, cognitive, and hearing and visual impairment Diet and physical activities Evidence for depression or mood disorders  The patient's weight, height, BMI, and HR/BP have been recorded in the chart.  I have made referrals, counseling, and provided education to the patient based on review of the above and I have provided the patient with a written personalized care plan for preventive services.     Cherre Robins, [facility]   01/10/2014

## 2014-01-14 ENCOUNTER — Other Ambulatory Visit: Payer: Medicare Other

## 2014-01-14 DIAGNOSIS — Z1212 Encounter for screening for malignant neoplasm of rectum: Secondary | ICD-10-CM

## 2014-01-14 NOTE — Progress Notes (Signed)
LAB ONLY 

## 2014-01-16 LAB — FECAL OCCULT BLOOD, IMMUNOCHEMICAL: Fecal Occult Bld: NEGATIVE

## 2014-01-17 ENCOUNTER — Other Ambulatory Visit: Payer: Self-pay | Admitting: Pharmacist

## 2014-01-17 DIAGNOSIS — Z13820 Encounter for screening for osteoporosis: Secondary | ICD-10-CM

## 2014-01-26 ENCOUNTER — Other Ambulatory Visit: Payer: Self-pay | Admitting: Family Medicine

## 2014-03-02 ENCOUNTER — Other Ambulatory Visit: Payer: Self-pay | Admitting: Nurse Practitioner

## 2014-03-13 ENCOUNTER — Other Ambulatory Visit: Payer: Self-pay | Admitting: Nurse Practitioner

## 2014-03-22 ENCOUNTER — Other Ambulatory Visit: Payer: Self-pay | Admitting: Nurse Practitioner

## 2014-03-31 ENCOUNTER — Ambulatory Visit: Payer: Medicare Other | Admitting: Nurse Practitioner

## 2014-04-28 ENCOUNTER — Encounter: Payer: Self-pay | Admitting: Nurse Practitioner

## 2014-04-28 ENCOUNTER — Ambulatory Visit (INDEPENDENT_AMBULATORY_CARE_PROVIDER_SITE_OTHER): Payer: Medicare Other | Admitting: Nurse Practitioner

## 2014-04-28 VITALS — BP 138/82 | HR 85 | Temp 97.6°F | Ht 63.0 in | Wt 190.0 lb

## 2014-04-28 DIAGNOSIS — I10 Essential (primary) hypertension: Secondary | ICD-10-CM | POA: Diagnosis not present

## 2014-04-28 DIAGNOSIS — E559 Vitamin D deficiency, unspecified: Secondary | ICD-10-CM | POA: Diagnosis not present

## 2014-04-28 DIAGNOSIS — Z6832 Body mass index (BMI) 32.0-32.9, adult: Secondary | ICD-10-CM | POA: Diagnosis not present

## 2014-04-28 DIAGNOSIS — E876 Hypokalemia: Secondary | ICD-10-CM

## 2014-04-28 DIAGNOSIS — E785 Hyperlipidemia, unspecified: Secondary | ICD-10-CM

## 2014-04-28 DIAGNOSIS — E039 Hypothyroidism, unspecified: Secondary | ICD-10-CM | POA: Diagnosis not present

## 2014-04-28 DIAGNOSIS — E119 Type 2 diabetes mellitus without complications: Secondary | ICD-10-CM

## 2014-04-28 DIAGNOSIS — R002 Palpitations: Secondary | ICD-10-CM | POA: Diagnosis not present

## 2014-04-28 LAB — POCT GLYCOSYLATED HEMOGLOBIN (HGB A1C): Hemoglobin A1C: 6

## 2014-04-28 NOTE — Patient Instructions (Signed)

## 2014-04-28 NOTE — Progress Notes (Signed)
Subjective:    Patient ID: Mckenzie Leonard, female    DOB: [birth year], 64 y.o.   MRN: [id number]  Patient here today for chronic disease follow up, no acute complaint.   Hyperlipidemia This is a chronic problem. The current episode started more than 1 year ago. Recent lipid tests were reviewed and are variable. Pertinent negatives include no chest pain or shortness of breath. Current antihyperlipidemic treatment includes statins. The current treatment provides moderate improvement of lipids. Compliance problems include adherence to diet and adherence to exercise.  Risk factors for coronary artery disease include dyslipidemia, hypertension, diabetes mellitus, obesity and post-menopausal.  Hypertension This is a chronic problem. The current episode started more than 1 year ago. The problem has been waxing and waning since onset. The problem is uncontrolled. Pertinent negatives include no chest pain, headaches, palpitations or shortness of breath. Risk factors for coronary artery disease include diabetes mellitus, dyslipidemia, obesity and post-menopausal state. Past treatments include angiotensin blockers and diuretics. The current treatment provides moderate improvement. Compliance problems include diet and exercise.  Hypertensive end-organ damage includes a thyroid problem.  Diabetes She presents for her follow-up diabetic visit. She has type 2 diabetes mellitus. No MedicAlert identification noted. Her disease course has been stable. Pertinent negatives for hypoglycemia include no headaches. Pertinent negatives for diabetes include no chest pain, no foot paresthesias, no polydipsia, no polyphagia and no polyuria. There are no hypoglycemic complications. There are no diabetic complications. Risk factors for coronary artery disease include diabetes mellitus, dyslipidemia, family history, hypertension, obesity and post-menopausal. Current diabetic treatment includes diet. She is compliant with treatment most  of the time. Her weight is stable. When asked about meal planning, she reported none. She has not had a previous visit with a dietitian. There is no change in her home blood glucose trend. Her breakfast blood glucose is taken between 8-9 am. Her breakfast blood glucose range is generally 130-140 mg/dl. Her overall blood glucose range is 130-140 mg/dl. An ACE inhibitor/angiotensin II receptor blocker is being taken. She does not see a podiatrist.Eye exam is not current.  Thyroid Problem Patient reports no palpitations. Her past medical history is significant for hyperlipidemia.  Hypokalemia Has history but os currently on no meds.  NO complainrt of lower ext cramping  *pt reports she has heart palpitation daily since started on mobic. Has cut down on caffeine consumption  Review of Systems  Constitutional: Negative.   Respiratory: Negative for shortness of breath.   Cardiovascular: Negative for chest pain and palpitations.  Endocrine: Negative for polydipsia, polyphagia and polyuria.  Neurological: Negative for headaches.  Psychiatric/Behavioral: Negative.   All other systems reviewed and are negative.      Objective:   Physical Exam  Constitutional: She is oriented to person, place, and time. She appears well-developed and well-nourished.  HENT:  Nose: Nose normal.  Mouth/Throat: Oropharynx is clear and moist.  Eyes: EOM are normal.  Neck: Trachea normal, normal range of motion and full passive range of motion without pain. Neck supple. No JVD present. Carotid bruit is not present. No thyromegaly present.  Cardiovascular: Normal rate, regular rhythm, normal heart sounds and intact distal pulses.  Exam reveals no gallop and no friction rub.   No murmur heard. Pulmonary/Chest: Effort normal and breath sounds normal.  Abdominal: Soft. Bowel sounds are normal. She exhibits no distension and no mass. There is no tenderness.  Musculoskeletal: Normal range of motion.  Lymphadenopathy:     She has no cervical adenopathy.  Neurological: She is  alert and oriented to person, place, and time. She has normal reflexes.  Skin: Skin is warm and dry.  Psychiatric: She has a normal mood and affect. Her behavior is normal. Judgment and thought content normal.    BP 138/82 mmHg  Pulse 85  Temp(Src) 97.6 F (36.4 C) (Oral)  Ht $R'5\' 3"'eA$  (1.6 m)  Wt 190 lb (86.183 kg)  BMI 33.67 kg/m2  EKG- Kerry Hough, FNP      Assessment & Plan:   1. Essential hypertension Low salt diet Keep diary of blood pressure Continue prescribe medication - CMP14+EGFR  2. Type 2 diabetes mellitus, controlled Carb count Encourage moderate exercise - POCT glycosylated hemoglobin (Hb A1C)  3. Hypothyroidism, unspecified hypothyroidism type Continue levothyroxine as prescribe  4. Vitamin D deficiency OTC VIT D supplement   5. Hypokalemia   6. Hyperlipidemia Low fat diet - NMR, lipoprofile  7. BMI 32.0-32.9,adult Diet and exercise encourage.   8. Palpitations Stop mobic and see if improves If doesn't improve- will need to out [facility] monitor on to observe    Pt has dexa scan and eye exam schedule in the near future,  Pt denied zostavax vaccine Labs pending Health maintenance reviewed Diet and exercise encouraged Continue all meds Follow up  In 3 months   [place], FNP

## 2014-04-29 ENCOUNTER — Other Ambulatory Visit: Payer: Self-pay | Admitting: Pharmacist

## 2014-04-29 DIAGNOSIS — Z780 Asymptomatic menopausal state: Secondary | ICD-10-CM

## 2014-04-29 LAB — CMP14+EGFR
ALT: 28 IU/L (ref 0–32)
AST: 25 IU/L (ref 0–40)
Albumin/Globulin Ratio: 2 (ref 1.1–2.5)
Albumin: 4.9 g/dL — ABNORMAL HIGH (ref 3.6–4.8)
Alkaline Phosphatase: 83 IU/L (ref 39–117)
BUN/Creatinine Ratio: 20 (ref 11–26)
BUN: 14 mg/dL (ref 8–27)
Bilirubin Total: 1 mg/dL (ref 0.0–1.2)
CO2: 26 mmol/L (ref 18–29)
CREATININE: 0.71 mg/dL (ref 0.57–1.00)
Calcium: 9.8 mg/dL (ref 8.7–10.3)
Chloride: 97 mmol/L (ref 97–108)
GFR calc Af Amer: 103 mL/min/{1.73_m2} (ref >59)
GFR, EST NON AFRICAN AMERICAN: 90 mL/min/{1.73_m2} (ref >59)
GLOBULIN, TOTAL: 2.5 g/dL (ref 1.5–4.5)
GLUCOSE: 92 mg/dL (ref 65–99)
POTASSIUM: 4.3 mmol/L (ref 3.5–5.2)
SODIUM: 142 mmol/L (ref 134–144)
Total Protein: 7.4 g/dL (ref 6.0–8.5)

## 2014-04-29 LAB — NMR, LIPOPROFILE
Cholesterol: 131 mg/dL (ref 100–199)
HDL CHOLESTEROL BY NMR: 50 mg/dL (ref >39)
HDL PARTICLE NUMBER: 39.1 umol/L (ref >=30.5)
LDL Particle Number: 567 nmol/L (ref <1000)
LDL Size: 20.3 nm (ref >20.5)
LDL-C: 50 mg/dL (ref 0–99)
LP-IR SCORE: 50 — AB (ref <=45)
Small LDL Particle Number: 412 nmol/L (ref <=527)
Triglycerides by NMR: 154 mg/dL — ABNORMAL HIGH (ref 0–149)

## 2014-04-30 ENCOUNTER — Encounter: Payer: Self-pay | Admitting: Pharmacist

## 2014-04-30 ENCOUNTER — Ambulatory Visit (INDEPENDENT_AMBULATORY_CARE_PROVIDER_SITE_OTHER): Payer: Medicare Other | Admitting: Pharmacist

## 2014-04-30 ENCOUNTER — Ambulatory Visit (INDEPENDENT_AMBULATORY_CARE_PROVIDER_SITE_OTHER): Payer: Medicare Other

## 2014-04-30 VITALS — Ht 63.0 in | Wt 190.0 lb

## 2014-04-30 DIAGNOSIS — M8580 Other specified disorders of bone density and structure, unspecified site: Secondary | ICD-10-CM | POA: Diagnosis not present

## 2014-04-30 DIAGNOSIS — Z780 Asymptomatic menopausal state: Secondary | ICD-10-CM | POA: Diagnosis not present

## 2014-04-30 LAB — HM DEXA SCAN

## 2014-04-30 MED ORDER — LEVOTHYROXINE SODIUM 50 MCG PO TABS: 50.0000 ug | ORAL_TABLET | Freq: Every day | ORAL | Status: DC

## 2014-04-30 MED ORDER — VALSARTAN-HYDROCHLOROTHIAZIDE 320-12.5 MG PO TABS: 1.0000 | ORAL_TABLET | ORAL | Status: DC

## 2014-04-30 NOTE — Progress Notes (Signed)
Patient ID: Mckenzie Leonard, female   DOB: 10-07-1950, 64 y.o.   MRN: [id number]  [facility] Current Height: Height: 5\' 3"  (160 cm)      Max Lifetime Height:  5\' 5"  Current Weight: Weight: 190 lb (86.183 kg)       Ethnicity:Caucasian       HPI: Does pt already have a diagnosis of:  Osteopenia?  Yes Osteoporosis?  No  Back Pain?  Yes       Kyphosis?  No Prior fracture?  No Med(s) for Osteoporosis/Osteopenia:  none Med(s) previously tried for Osteoporosis/Osteopenia:  None - prescribed alendronate in 2008 but did not take                                                             PMH: Age at menopause:  Early 50's Hysterectomy?  No Oophorectomy?  No HRT? No Steroid Use?  No Thyroid med?  Yes History of cancer?  No History of digestive disorders (ie Crohn's)?  No Current or previous eating disorders?  No Last Vitamin D Result:  44.5 (09/16/2013) Last GFR Result:  90 (04/28/2014)   FH/SH: Family history of osteoporosis?  No Parent with history of hip fracture?  No Family history of breast cancer?  No Exercise?  No Smoking?  No Alcohol?  No    Calcium Assessment Calcium Intake  # of servings/day  Calcium mg  Milk (8 oz) 1  x  300  = 300mg   Yogurt (4 oz) 0 x  200 = 0  Cheese (1 oz) 1 x  200 = 200mg   Other Calcium sources   250mg   Ca supplement MVI - 400mg  = 400mg    Estimated calcium intake per day 1150mg     DEXA Results Date of Test T-Score for AP Spine L1-L4 T-Score for Total Left Hip T-Score for Total Right Hip  04/30/2014 0.7 -1.5 -1.5  06/12/2006 0.4 -1.6 -1.4  03/04/2002 0.3 -0.7 --        FRAX 10 year estimate: Total FX risk:  11%  (consider medication if >/= 20%) Hip FX risk:  2.0%  (consider medication if >/= 3%)  Assessment: Osteopenia with low FRAX estimate and stable BMD  Recommendations: 1.   Discussed BMD  / DEXA results and discussed fracture risk. 2.  recommend calcium 1200mg  daily through supplementation or diet.  3.  recommend weight  bearing exercise - 30 minutes at least 4 days per week.   4.  Counseled and educated about fall risk and prevention. 5.  Also sent in Rx's for 90 days supply for levothyroxine and valsartan HCTZ.   6.  At patient request also called [facility] and asked for mobic / meloxicam to be removed from automatic refill since this medication is currently on hold.  Recheck DEXA:  2 years  Time spent counseling patient:  30 minutes   Cherre Robins, PharmD, CPP

## 2014-04-30 NOTE — Patient Instructions (Signed)
Fall Prevention and Home Safety Falls cause injuries and can affect all age groups. It is possible to use preventive measures to significantly decrease the likelihood of falls. There are many simple measures which can make your home safer and prevent falls. OUTDOORS  Repair cracks and edges of walkways and driveways.  Remove high doorway thresholds.  Trim shrubbery on the main path into your home.  Have good outside lighting.  Clear walkways of tools, rocks, debris, and clutter.  Check that handrails are not broken and are securely fastened. Both sides of steps should have handrails.  Have leaves, snow, and ice cleared regularly.  Use sand or salt on walkways during winter months.  In the garage, clean up grease or oil spills. BATHROOM  Install night lights.  Install grab bars by the toilet and in the tub and shower.  Use non-skid mats or decals in the tub or shower.  Place a plastic non-slip stool in the shower to sit on, if needed.  Keep floors dry and clean up all water on the floor immediately.  Remove soap buildup in the tub or shower on a regular basis.  Secure bath mats with non-slip, double-sided rug tape.  Remove throw rugs and tripping hazards from the floors. BEDROOMS  Install night lights.  Make sure a bedside light is easy to reach.  Do not use oversized bedding.  Keep a telephone by your bedside.  Have a firm chair with side arms to use for getting dressed.  Remove throw rugs and tripping hazards from the floor. KITCHEN  Keep handles on pots and pans turned toward the center of the stove. Use back burners when possible.  Clean up spills quickly and allow time for drying.  Avoid walking on wet floors.  Avoid hot utensils and knives.  Position shelves so they are not too high or low.  Place commonly used objects within easy reach.  If necessary, use a sturdy step stool with a grab bar when reaching.  Keep electrical cables out of the  way.  Do not use floor polish or wax that makes floors slippery. If you must use wax, use non-skid floor wax.  Remove throw rugs and tripping hazards from the floor. STAIRWAYS  Never leave objects on stairs.  Place handrails on both sides of stairways and use them. Fix any loose handrails. Make sure handrails on both sides of the stairways are as long as the stairs.  Check carpeting to make sure it is firmly attached along stairs. Make repairs to worn or loose carpet promptly.  Avoid placing throw rugs at the top or bottom of stairways, or properly secure the rug with carpet tape to prevent slippage. Get rid of throw rugs, if possible.  Have an electrician put in a light switch at the top and bottom of the stairs. OTHER FALL PREVENTION TIPS  Wear low-heel or rubber-soled shoes that are supportive and fit well. Wear closed toe shoes.  When using a stepladder, make sure it is fully opened and both spreaders are firmly locked. Do not climb a closed stepladder.  Add color or contrast paint or tape to grab bars and handrails in your home. Place contrasting color strips on first and last steps.  Learn and use mobility aids as needed. Install an electrical emergency response system.  Turn on lights to avoid dark areas. Replace light bulbs that burn out immediately. Get light switches that glow.  Arrange furniture to create clear pathways. Keep furniture in the same place.    Firmly attach carpet with non-skid or double-sided tape.  Eliminate uneven floor surfaces.  Select a carpet pattern that does not visually hide the edge of steps.  Be aware of all pets. OTHER HOME SAFETY TIPS  Set the water temperature for 120 F (48.8 C).  Keep emergency numbers on or near the telephone.  Keep smoke detectors on every level of the home and near sleeping areas. Document Released: 02/04/2002 Document Revised: 08/16/2011 Document Reviewed: 05/06/2011 Exit[employer] Patient Information 2015  [employer]. This information is not intended to replace advice given to you by your health care provider. Make sure you discuss any questions you have with your health care provider.                Exercise for Strong Bones  Exercise is important to build and maintain strong bones / bone density.  There are 2 types of exercises that are important to building and maintaining strong bones:  Weight- bearing and muscle-stregthening.  Weight-bearing Exercises  These exercises include activities that make you move against gravity while staying upright. Weight-bearing exercises can be high-impact or low-impact.  High-impact weight-bearing exercises help build bones and keep them strong. If you have broken a bone due to osteoporosis or are at risk of breaking a bone, you may need to avoid high-impact exercises. If you're not sure, you should check with your healthcare provider.  Examples of high-impact weight-bearing exercises are: Dancing  Doing high-impact aerobics  Hiking  Jogging/running  Jumping Rope  Stair climbing  Tennis  Low-impact weight-bearing exercises can also help keep bones strong and are a safe alternative if you cannot do high-impact exercises.   Examples of low-impact weight-bearing exercises are: Using elliptical training machines  Doing low-impact aerobics  Using stair-step machines  Fast walking on a treadmill or outside   Muscle-Strengthening Exercises These exercises include activities where you move your body, a weight or some other resistance against gravity. They are also known as resistance exercises and include: Lifting weights  Using elastic exercise bands  Using weight machines  Lifting your own body weight  Functional movements, such as standing and rising up on your toes  Yoga and Pilates can also improve strength, balance and flexibility. However, certain positions may not be safe for people with osteoporosis or those at increased risk of broken  bones. For example, exercises that have you bend forward may increase the chance of breaking a bone in the spine.   Non-Impact Exercises There are other types of exercises that can help prevent falls.  Non-impact exercises can help you to improve balance, posture and how well you move in everyday activities. Some of these exercises include: Balance exercises that strengthen your legs and test your balance, such as Tai Chi, can decrease your risk of falls.  Posture exercises that improve your posture and reduce rounded or "sloping" shoulders can help you decrease the chance of breaking a bone, especially in the spine.  Functional exercises that improve how well you move can help you with everyday activities and decrease your chance of falling and breaking a bone. For example, if you have trouble getting up from a chair or climbing stairs, you should do these activities as exercises.   **A physical therapist can teach you balance, posture and functional exercises. He/she can also help you learn which exercises are safe and appropriate for you.   has a physical therapy office in [place] in front of our office and referrals can be made for assessments   and treatment as needed and strength and balance training.  If you would like to have an assessment with Chad and our physical therapy team please let a nurse or provider know.    

## 2014-05-01 LAB — HM DIABETES EYE EXAM

## 2014-05-05 ENCOUNTER — Telehealth: Payer: Self-pay | Admitting: Nurse Practitioner

## 2014-05-06 NOTE — Telephone Encounter (Signed)
Try aleve OTC- if no help will need to do rheumatology referral

## 2014-05-06 NOTE — Telephone Encounter (Signed)
Patient aware.

## 2014-05-15 ENCOUNTER — Other Ambulatory Visit: Payer: Self-pay | Admitting: Family Medicine

## 2014-05-15 DIAGNOSIS — Z1231 Encounter for screening mammogram for malignant neoplasm of breast: Secondary | ICD-10-CM

## 2014-05-15 DIAGNOSIS — H43813 Vitreous degeneration, bilateral: Secondary | ICD-10-CM | POA: Diagnosis not present

## 2014-05-15 DIAGNOSIS — H52223 Regular astigmatism, bilateral: Secondary | ICD-10-CM | POA: Diagnosis not present

## 2014-05-15 DIAGNOSIS — H5203 Hypermetropia, bilateral: Secondary | ICD-10-CM | POA: Diagnosis not present

## 2014-05-15 DIAGNOSIS — H524 Presbyopia: Secondary | ICD-10-CM | POA: Diagnosis not present

## 2014-05-21 ENCOUNTER — Ambulatory Visit (HOSPITAL_COMMUNITY)
Admission: RE | Admit: 2014-05-21 | Discharge: 2014-05-21 | Disposition: A | Discharge status: Home / Self Care | Payer: Medicare Other | Source: Ambulatory Visit | Attending: Family Medicine | Admitting: Family Medicine

## 2014-05-21 DIAGNOSIS — Z1231 Encounter for screening mammogram for malignant neoplasm of breast: Secondary | ICD-10-CM | POA: Diagnosis not present

## 2014-06-09 ENCOUNTER — Other Ambulatory Visit: Payer: Self-pay | Admitting: Nurse Practitioner

## 2014-08-20 ENCOUNTER — Ambulatory Visit (INDEPENDENT_AMBULATORY_CARE_PROVIDER_SITE_OTHER): Payer: Medicare Other | Admitting: Nurse Practitioner

## 2014-08-20 ENCOUNTER — Encounter: Payer: Self-pay | Admitting: Nurse Practitioner

## 2014-08-20 ENCOUNTER — Encounter (INDEPENDENT_AMBULATORY_CARE_PROVIDER_SITE_OTHER): Payer: Self-pay

## 2014-08-20 VITALS — BP 138/82 | HR 70 | Temp 97.2°F | Ht 63.0 in | Wt 193.2 lb

## 2014-08-20 DIAGNOSIS — E559 Vitamin D deficiency, unspecified: Secondary | ICD-10-CM

## 2014-08-20 DIAGNOSIS — M8580 Other specified disorders of bone density and structure, unspecified site: Secondary | ICD-10-CM | POA: Diagnosis not present

## 2014-08-20 DIAGNOSIS — E119 Type 2 diabetes mellitus without complications: Secondary | ICD-10-CM | POA: Diagnosis not present

## 2014-08-20 DIAGNOSIS — E876 Hypokalemia: Secondary | ICD-10-CM | POA: Diagnosis not present

## 2014-08-20 DIAGNOSIS — E039 Hypothyroidism, unspecified: Secondary | ICD-10-CM

## 2014-08-20 DIAGNOSIS — Z6832 Body mass index (BMI) 32.0-32.9, adult: Secondary | ICD-10-CM

## 2014-08-20 DIAGNOSIS — I10 Essential (primary) hypertension: Secondary | ICD-10-CM | POA: Diagnosis not present

## 2014-08-20 DIAGNOSIS — E785 Hyperlipidemia, unspecified: Secondary | ICD-10-CM

## 2014-08-20 LAB — POCT GLYCOSYLATED HEMOGLOBIN (HGB A1C): Hemoglobin A1C: 5.9

## 2014-08-20 LAB — POCT UA - MICROALBUMIN: MICROALBUMIN (UR) POC: 20 mg/L

## 2014-08-20 NOTE — Progress Notes (Signed)
Subjective:    Patient ID: Mckenzie Leonard, female    DOB: 1950-07-13, 64 y.o.   MRN: [id number]  Patient here today for chronic disease follow up, no acute complaint.   Hyperlipidemia This is a chronic problem. The current episode started more than 1 year ago. Recent lipid tests were reviewed and are variable. Pertinent negatives include no chest pain or shortness of breath. Current antihyperlipidemic treatment includes statins. The current treatment provides moderate improvement of lipids. Compliance problems include adherence to diet and adherence to exercise.  Risk factors for coronary artery disease include dyslipidemia, hypertension, diabetes mellitus, obesity and post-menopausal.  Hypertension This is a chronic problem. The current episode started more than 1 year ago. The problem has been waxing and waning since onset. The problem is uncontrolled. Pertinent negatives include no chest pain, headaches, palpitations or shortness of breath. Risk factors for coronary artery disease include diabetes mellitus, dyslipidemia, obesity and post-menopausal state. Past treatments include angiotensin blockers and diuretics. The current treatment provides moderate improvement. Compliance problems include diet and exercise.  Hypertensive end-organ damage includes a thyroid problem.  Diabetes She presents for her follow-up diabetic visit. She has type 2 diabetes mellitus. No MedicAlert identification noted. Her disease course has been stable. Pertinent negatives for hypoglycemia include no headaches. Pertinent negatives for diabetes include no chest pain, no foot paresthesias, no polydipsia, no polyphagia and no polyuria. There are no hypoglycemic complications. There are no diabetic complications. Risk factors for coronary artery disease include diabetes mellitus, dyslipidemia, family history, hypertension, obesity and post-menopausal. Current diabetic treatment includes diet. She is compliant with treatment  most of the time. Her weight is stable. When asked about meal planning, she reported none. She has not had a previous visit with a dietitian. There is no change in her home blood glucose trend. Her breakfast blood glucose is taken between 8-9 am. Her breakfast blood glucose range is generally 130-140 mg/dl. Her overall blood glucose range is 130-140 mg/dl. An ACE inhibitor/angiotensin II receptor blocker is being taken. She does not see a podiatrist.Eye exam is not current.  Thyroid Problem Patient reports no palpitations. Her past medical history is significant for hyperlipidemia.  Hypokalemia Has history but os currently on no meds.  NO complainrt of lower ext cramping osteopenia Not doing much weight bearing exercises due to knee pain Vitamin d def Gets lots of sun exposure and takes vitamin d daily   Review of Systems  Constitutional: Negative.   Respiratory: Negative for shortness of breath.   Cardiovascular: Negative for chest pain and palpitations.  Endocrine: Negative for polydipsia, polyphagia and polyuria.  Neurological: Negative for headaches.  Psychiatric/Behavioral: Negative.   All other systems reviewed and are negative.      Objective:   Physical Exam  Constitutional: She is oriented to person, place, and time. She appears well-developed and well-nourished.  HENT:  Nose: Nose normal.  Mouth/Throat: Oropharynx is clear and moist.  Eyes: EOM are normal.  Neck: Trachea normal, normal range of motion and full passive range of motion without pain. Neck supple. No JVD present. Carotid bruit is not present. No thyromegaly present.  Cardiovascular: Normal rate, regular rhythm, normal heart sounds and intact distal pulses.  Exam reveals no gallop and no friction rub.   No murmur heard. Pulmonary/Chest: Effort normal and breath sounds normal.  Abdominal: Soft. Bowel sounds are normal. She exhibits no distension and no mass. There is no tenderness.  Musculoskeletal: Normal range  of motion.  Lymphadenopathy:    She  has no cervical adenopathy.  Neurological: She is alert and oriented to person, place, and time. She has normal reflexes.  Skin: Skin is warm and dry.  Psychiatric: She has a normal mood and affect. Her behavior is normal. Judgment and thought content normal.    BP 138/82 mmHg  Pulse 70  Temp(Src) 97.2 F (36.2 C) (Oral)  Ht 5' 3" (1.6 m)  Wt 193 lb 3.2 oz (87.635 kg)  BMI 34.23 kg/m2  Results for orders placed or performed in visit on 08/20/14  POCT glycosylated hemoglobin (Hb A1C)  Result Value Ref Range   Hemoglobin A1C 5.9   POCT UA - Microalbumin  Result Value Ref Range   Microalbumin Ur, POC 20 mg/L           Assessment & Plan:   1. Hyperlipidemia Low fat diet - Lipid panel  2. Type 2 diabetes mellitus, controlled Continue to watch carbs in diet - POCT glycosylated hemoglobin (Hb A1C) - CMP14+EGFR - POCT UA - Microalbumin - Microalbumin, urine  3. Essential hypertension Do not add salt to diet  4. Hypothyroidism, unspecified hypothyroidism type  5. Osteopenia Encouraged to do weight bearing exercises  6. Hypokalemia  7. BMI 32.0-32.9,adult Discussed diet and exercise for person with BMI >25 Will recheck weight in 3-6 months   8. Vitamin D deficiency   Refuses adult immunizations Labs pending Health maintenance reviewed Diet and exercise encouraged Continue all meds Follow up  In 3 monthsr   Mary-Margaret Martin, FNP    

## 2014-08-20 NOTE — Patient Instructions (Signed)
Bone Health Our bones do many things. They provide structure, protect organs, anchor muscles, and store calcium. Adequate calcium in your diet and weight-bearing physical activity help build strong bones, improve bone amounts, and may reduce the risk of weakening of bones (osteoporosis) later in life. PEAK BONE MASS By age 66, the average woman has acquired most of her skeletal bone mass. A large decline occurs in older adults which increases the risk of osteoporosis. In women this occurs around the time of menopause. It is important for young girls to reach their peak bone mass in order to maintain bone health throughout life. A person with high bone mass as a young adult will be more likely to have a higher bone mass later in life. Not enough calcium consumption and physical activity early on could result in a failure to achieve optimum bone mass in adulthood. OSTEOPOROSIS Osteoporosis is a disease of the bones. It is defined as low bone mass with deterioration of bone structure. Osteoporosis leads to an increase risk of fractures with falls. These fractures commonly happen in the wrist, hip, and spine. While men and women of all ages and background can develop osteoporosis, some of the risk factors for osteoporosis are:  Female.  White.  Postmenopausal.  Older adults.  Small in body size.  Eating a diet low in calcium.  Physically inactive.  Smoking.  Use of some medications.  Family history. CALCIUM Calcium is a mineral needed by the body for healthy bones, teeth, and proper function of the heart, muscles, and nerves. The body cannot produce calcium so it must be absorbed through food. Good sources of calcium include:  Dairy products (low fat or nonfat milk, cheese, and yogurt).  Dark green leafy vegetables (bok choy and broccoli).  Calcium fortified foods (orange juice, cereal, bread, soy beverages, and tofu products).  Nuts (almonds). Recommended amounts of calcium vary  for individuals. RECOMMENDED CALCIUM INTAKES Age and Amount in mg per day  Children 1 to 3 years / 700 mg  Children 4 to 8 years / 1,000 mg  Children 9 to 13 years / 1,300 mg  Teens 14 to 18 years / 1,300 mg  Adults 19 to 50 years / 1,000 mg  Adult women 51 to 70 years / 1,200 mg  Adults 71 years and older / 1,200 mg  Pregnant and breastfeeding teens / 1,300 mg  Pregnant and breastfeeding adults / 1,000 mg Vitamin D also plays an important role in healthy bone development. Vitamin D helps in the absorption of calcium. WEIGHT-BEARING PHYSICAL ACTIVITY Regular physical activity has many positive health benefits. Benefits include strong bones. Weight-bearing physical activity early in life is important in reaching peak bone mass. Weight-bearing physical activities cause muscles and bones to work against gravity. Some examples of weight bearing physical activities include:  Walking, jogging, or running.  Field Hockey.  Jumping rope.  Dancing.  Soccer.  Tennis or Racquetball.  Stair climbing.  Basketball.  Hiking.  Weight lifting.  Aerobic fitness classes. Including weight-bearing physical activity into an exercise plan is a great way to keep bones healthy. Adults: Engage in at least 30 minutes of moderate physical activity on most, preferably all, days of the week. Children: Engage in at least 60 minutes of moderate physical activity on most, preferably all, days of the week. FOR MORE INFORMATION [place] Department of Agriculture, Center for Nutrition Policy and Promotion: www.cnpp.usda.gov National Osteoporosis Foundation: www.nof.org Document Released: 05/07/2003 Document Revised: 06/11/2012 Document Reviewed: 08/06/2008 Exit[employer] Patient Information   2015 [employer]. This information is not intended to replace advice given to you by your health care provider. Make sure you discuss any questions you have with your health care provider.  

## 2014-08-21 LAB — CMP14+EGFR
ALT: 26 IU/L (ref 0–32)
AST: 22 IU/L (ref 0–40)
Albumin/Globulin Ratio: 2 (ref 1.1–2.5)
Albumin: 4.6 g/dL (ref 3.6–4.8)
Alkaline Phosphatase: 75 IU/L (ref 39–117)
BILIRUBIN TOTAL: 1 mg/dL (ref 0.0–1.2)
BUN/Creatinine Ratio: 21 (ref 11–26)
BUN: 16 mg/dL (ref 8–27)
CALCIUM: 9.1 mg/dL (ref 8.7–10.3)
CHLORIDE: 98 mmol/L (ref 97–108)
CO2: 29 mmol/L (ref 18–29)
CREATININE: 0.75 mg/dL (ref 0.57–1.00)
GFR, EST AFRICAN AMERICAN: 97 mL/min/{1.73_m2} (ref >59)
GFR, EST NON AFRICAN AMERICAN: 84 mL/min/{1.73_m2} (ref >59)
Globulin, Total: 2.3 g/dL (ref 1.5–4.5)
Glucose: 112 mg/dL — ABNORMAL HIGH (ref 65–99)
POTASSIUM: 4.5 mmol/L (ref 3.5–5.2)
Sodium: 142 mmol/L (ref 134–144)
Total Protein: 6.9 g/dL (ref 6.0–8.5)

## 2014-08-21 LAB — LIPID PANEL
CHOL/HDL RATIO: 2.8 ratio (ref 0.0–4.4)
Cholesterol, Total: 130 mg/dL (ref 100–199)
HDL: 46 mg/dL (ref >39)
LDL Calculated: 56 mg/dL (ref 0–99)
TRIGLYCERIDES: 138 mg/dL (ref 0–149)
VLDL Cholesterol Cal: 28 mg/dL (ref 5–40)

## 2014-08-21 LAB — MICROALBUMIN, URINE: MICROALBUM., U, RANDOM: 11.7 ug/mL

## 2014-09-17 DIAGNOSIS — S0292XS Unspecified fracture of facial bones, sequela: Secondary | ICD-10-CM | POA: Diagnosis not present

## 2014-09-17 DIAGNOSIS — J329 Chronic sinusitis, unspecified: Secondary | ICD-10-CM | POA: Diagnosis not present

## 2014-11-21 ENCOUNTER — Ambulatory Visit (INDEPENDENT_AMBULATORY_CARE_PROVIDER_SITE_OTHER): Payer: Medicare Other | Admitting: Nurse Practitioner

## 2014-11-21 ENCOUNTER — Encounter: Payer: Self-pay | Admitting: Nurse Practitioner

## 2014-11-21 VITALS — BP 141/75 | HR 64 | Temp 97.2°F | Ht 63.0 in | Wt 193.0 lb

## 2014-11-21 DIAGNOSIS — E559 Vitamin D deficiency, unspecified: Secondary | ICD-10-CM

## 2014-11-21 DIAGNOSIS — E785 Hyperlipidemia, unspecified: Secondary | ICD-10-CM

## 2014-11-21 DIAGNOSIS — I10 Essential (primary) hypertension: Secondary | ICD-10-CM | POA: Diagnosis not present

## 2014-11-21 DIAGNOSIS — E119 Type 2 diabetes mellitus without complications: Secondary | ICD-10-CM

## 2014-11-21 DIAGNOSIS — Z01419 Encounter for gynecological examination (general) (routine) without abnormal findings: Secondary | ICD-10-CM

## 2014-11-21 DIAGNOSIS — Z0000 Encounter for general adult medical examination without abnormal findings: Secondary | ICD-10-CM | POA: Diagnosis not present

## 2014-11-21 DIAGNOSIS — Z6832 Body mass index (BMI) 32.0-32.9, adult: Secondary | ICD-10-CM | POA: Diagnosis not present

## 2014-11-21 DIAGNOSIS — E039 Hypothyroidism, unspecified: Secondary | ICD-10-CM | POA: Diagnosis not present

## 2014-11-21 DIAGNOSIS — E876 Hypokalemia: Secondary | ICD-10-CM | POA: Diagnosis not present

## 2014-11-21 LAB — POCT GLYCOSYLATED HEMOGLOBIN (HGB A1C): Hemoglobin A1C: 6.1

## 2014-11-21 LAB — POCT UA - MICROSCOPIC ONLY
BACTERIA, U MICROSCOPIC: NEGATIVE
Casts, Ur, LPF, POC: NEGATIVE
Crystals, Ur, HPF, POC: NEGATIVE
RBC, urine, microscopic: NEGATIVE
WBC, UR, HPF, POC: NEGATIVE
Yeast, UA: NEGATIVE

## 2014-11-21 LAB — POCT URINALYSIS DIPSTICK
Bilirubin, UA: NEGATIVE
Glucose, UA: NEGATIVE
Ketones, UA: NEGATIVE
Leukocytes, UA: NEGATIVE
Nitrite, UA: NEGATIVE
Protein, UA: NEGATIVE
RBC UA: NEGATIVE
Spec Grav, UA: 1.025
UROBILINOGEN UA: NEGATIVE
pH, UA: 5

## 2014-11-21 MED ORDER — VALSARTAN-HYDROCHLOROTHIAZIDE 320-12.5 MG PO TABS: 1.0000 | ORAL_TABLET | ORAL | Status: DC

## 2014-11-21 MED ORDER — LEVOTHYROXINE SODIUM 50 MCG PO TABS: 50.0000 ug | ORAL_TABLET | Freq: Every day | ORAL | Status: DC

## 2014-11-21 MED ORDER — ATORVASTATIN CALCIUM 40 MG PO TABS: 40.0000 mg | ORAL_TABLET | Freq: Every day | ORAL | Status: DC

## 2014-11-21 NOTE — Progress Notes (Signed)
Subjective:    Patient ID: Mckenzie Leonard, female    DOB: Apr 16, 1950, 64 y.o.   MRN: [id number]  Patient here today for CPE and PAP with chronic disease follow up, no acute complaint.   Hyperlipidemia This is a chronic problem. The current episode started more than 1 year ago. Recent lipid tests were reviewed and are variable. Pertinent negatives include no chest pain or shortness of breath. Current antihyperlipidemic treatment includes statins. The current treatment provides moderate improvement of lipids. Compliance problems include adherence to diet and adherence to exercise.  Risk factors for coronary artery disease include dyslipidemia, hypertension, diabetes mellitus, obesity and post-menopausal.  Hypertension This is a chronic problem. The current episode started more than 1 year ago. The problem has been waxing and waning since onset. The problem is uncontrolled. Pertinent negatives include no chest pain, headaches, palpitations or shortness of breath. Risk factors for coronary artery disease include diabetes mellitus, dyslipidemia, obesity and post-menopausal state. Past treatments include angiotensin blockers and diuretics. The current treatment provides moderate improvement. Compliance problems include diet and exercise.  Hypertensive end-organ damage includes a thyroid problem.  Diabetes She presents for her follow-up diabetic visit. She has type 2 diabetes mellitus. No MedicAlert identification noted. Her disease course has been stable. Pertinent negatives for hypoglycemia include no headaches. Pertinent negatives for diabetes include no chest pain, no foot paresthesias, no polydipsia, no polyphagia and no polyuria. There are no hypoglycemic complications. There are no diabetic complications. Risk factors for coronary artery disease include diabetes mellitus, dyslipidemia, family history, hypertension, obesity and post-menopausal. Current diabetic treatment includes diet. She is compliant  with treatment most of the time. Her weight is stable. When asked about meal planning, she reported none. She has not had a previous visit with a dietitian. There is no change in her home blood glucose trend. Her breakfast blood glucose is taken between 8-9 am. Her breakfast blood glucose range is generally 130-140 mg/dl. Her overall blood glucose range is 130-140 mg/dl. An ACE inhibitor/angiotensin II receptor blocker is being taken. She does not see a podiatrist.Eye exam is not current.  Thyroid Problem Patient reports no palpitations. Her past medical history is significant for hyperlipidemia.  Hypokalemia Has history but os currently on no meds.  NO complainrt of lower ext cramping osteopenia Not doing much weight bearing exercises due to knee pain Vitamin d def Gets lots of sun exposure and takes vitamin d daily   Review of Systems  Constitutional: Negative.   Respiratory: Negative for shortness of breath.   Cardiovascular: Negative for chest pain and palpitations.  Endocrine: Negative for polydipsia, polyphagia and polyuria.  Neurological: Negative for headaches.  Psychiatric/Behavioral: Negative.   All other systems reviewed and are negative.      Objective:   Physical Exam  Constitutional: She is oriented to person, place, and time. She appears well-developed and well-nourished.  HENT:  Nose: Nose normal.  Mouth/Throat: Oropharynx is clear and moist.  Eyes: EOM are normal.  Neck: Trachea normal, normal range of motion and full passive range of motion without pain. Neck supple. No JVD present. Carotid bruit is not present. No thyromegaly present.  Cardiovascular: Normal rate, regular rhythm, normal heart sounds and intact distal pulses.  Exam reveals no gallop and no friction rub.   No murmur heard. Pulmonary/Chest: Effort normal and breath sounds normal.  Abdominal: Soft. Bowel sounds are normal. She exhibits no distension and no mass. There is no tenderness.    Musculoskeletal: Normal range of motion.  Lymphadenopathy:    She has no cervical adenopathy.  Neurological: She is alert and oriented to person, place, and time. She has normal reflexes.  Skin: Skin is warm and dry.  Psychiatric: She has a normal mood and affect. Her behavior is normal. Judgment and thought content normal.   BP 141/75 mmHg  Pulse 64  Temp(Src) 97.2 F (36.2 C) (Oral)  Ht _0  (1.6 m)  Wt 193 lb (87.544 kg)  BMI 34.20 kg/m2  Results for orders placed or performed in visit on 11/21/14  POCT glycosylated hemoglobin (Hb A1C)  Result Value Ref Range   Hemoglobin A1C 6.1   POCT urinalysis dipstick  Result Value Ref Range   Color, UA yellow    Clarity, UA clear    Glucose, UA neg    Bilirubin, UA neg    Ketones, UA neg    Spec Grav, UA 1.025    Blood, UA neg    pH, UA 5.0    Protein, UA neg    Urobilinogen, UA negative    Nitrite, UA neg    Leukocytes, UA Negative Negative  POCT UA - Microscopic Only  Result Value Ref Range   WBC, Ur, HPF, POC neg    RBC, urine, microscopic neg    Bacteria, U Microscopic neg    Mucus, UA occ    Epithelial cells, urine per micros many    Crystals, Ur, HPF, POC neg    Casts, Ur, LPF, POC neg    Yeast, UA neg        Assessment & Plan:   1. Hyperlipidemia Low fat diet - Lipid panel - atorvastatin (LIPITOR) 40 MG tablet; Take 1 tablet (40 mg total) by mouth daily.  Dispense: 90 tablet; Refill: 1  2. Annual physical exam - POCT urinalysis dipstick - POCT UA - Microscopic Only  3. Encounter for routine gynecological examination - Pap IG (Image Guided)  4. Type 2 diabetes mellitus, controlled Continue to watch carbs in diet - POCT glycosylated hemoglobin (Hb A1C)  5. Essential hypertension Do not add salt to diet - CMP14+EGFR - valsartan-hydrochlorothiazide (DIOVAN-HCT) 320-12.5 MG per tablet; Take 1 tablet by mouth every morning.  Dispense: 90 tablet; Refill: 1  6. Hypokalemia  7. BMI  32.0-32.9,adult Discussed diet and exercise for person with BMI >25 Will recheck weight in 3-6 months   8. Vitamin D deficiency  9. Hypothyroidism, unspecified hypothyroidism type - levothyroxine (SYNTHROID, LEVOTHROID) 50 MCG tablet; Take 1 tablet (50 mcg total) by mouth daily.  Dispense: 90 tablet; Refill: 1    Labs pending Health maintenance reviewed Diet and exercise encouraged Continue all meds Follow up  In 6 months   [place], FNP

## 2014-11-21 NOTE — Patient Instructions (Signed)

## 2014-11-22 LAB — CMP14+EGFR
ALK PHOS: 75 IU/L (ref 39–117)
ALT: 17 IU/L (ref 0–32)
AST: 18 IU/L (ref 0–40)
Albumin/Globulin Ratio: 1.8 (ref 1.1–2.5)
Albumin: 4.3 g/dL (ref 3.6–4.8)
BILIRUBIN TOTAL: 0.9 mg/dL (ref 0.0–1.2)
BUN/Creatinine Ratio: 18 (ref 11–26)
BUN: 14 mg/dL (ref 8–27)
CHLORIDE: 101 mmol/L (ref 97–108)
CO2: 23 mmol/L (ref 18–29)
Calcium: 9.1 mg/dL (ref 8.7–10.3)
Creatinine, Ser: 0.79 mg/dL (ref 0.57–1.00)
GFR calc Af Amer: 90 mL/min/{1.73_m2} (ref >59)
GFR calc non Af Amer: 78 mL/min/{1.73_m2} (ref >59)
GLUCOSE: 102 mg/dL — AB (ref 65–99)
Globulin, Total: 2.4 g/dL (ref 1.5–4.5)
Potassium: 4.2 mmol/L (ref 3.5–5.2)
Sodium: 142 mmol/L (ref 134–144)
TOTAL PROTEIN: 6.7 g/dL (ref 6.0–8.5)

## 2014-11-22 LAB — LIPID PANEL
CHOLESTEROL TOTAL: 122 mg/dL (ref 100–199)
Chol/HDL Ratio: 2.7 ratio units (ref 0.0–4.4)
HDL: 45 mg/dL (ref >39)
LDL Calculated: 55 mg/dL (ref 0–99)
Triglycerides: 110 mg/dL (ref 0–149)
VLDL CHOLESTEROL CAL: 22 mg/dL (ref 5–40)

## 2014-11-26 LAB — PAP IG (IMAGE GUIDED): PAP Smear Comment: 0

## 2015-01-14 ENCOUNTER — Telehealth: Payer: Self-pay | Admitting: Nurse Practitioner

## 2015-03-10 ENCOUNTER — Other Ambulatory Visit: Payer: Self-pay | Admitting: Family Medicine

## 2015-03-24 ENCOUNTER — Telehealth: Payer: Self-pay | Admitting: *Deleted

## 2015-03-24 DIAGNOSIS — E785 Hyperlipidemia, unspecified: Secondary | ICD-10-CM

## 2015-03-24 DIAGNOSIS — I10 Essential (primary) hypertension: Secondary | ICD-10-CM

## 2015-03-24 DIAGNOSIS — E039 Hypothyroidism, unspecified: Secondary | ICD-10-CM

## 2015-03-24 MED ORDER — LEVOTHYROXINE SODIUM 50 MCG PO TABS: 50.0000 ug | ORAL_TABLET | Freq: Every day | ORAL | Status: DC

## 2015-03-24 MED ORDER — ATORVASTATIN CALCIUM 40 MG PO TABS: 40.0000 mg | ORAL_TABLET | Freq: Every day | ORAL | Status: DC

## 2015-03-24 MED ORDER — VALSARTAN-HYDROCHLOROTHIAZIDE 320-12.5 MG PO TABS: 1.0000 | ORAL_TABLET | ORAL | Status: DC

## 2015-03-24 NOTE — Telephone Encounter (Signed)
done

## 2015-04-22 ENCOUNTER — Other Ambulatory Visit: Payer: Self-pay | Admitting: Family Medicine

## 2015-04-22 DIAGNOSIS — Z1231 Encounter for screening mammogram for malignant neoplasm of breast: Secondary | ICD-10-CM

## 2015-05-22 ENCOUNTER — Ambulatory Visit (INDEPENDENT_AMBULATORY_CARE_PROVIDER_SITE_OTHER): Payer: Medicare Other | Admitting: Nurse Practitioner

## 2015-05-22 ENCOUNTER — Encounter: Payer: Self-pay | Admitting: Nurse Practitioner

## 2015-05-22 VITALS — BP 124/83 | HR 76 | Temp 98.5°F | Ht 63.0 in | Wt 194.4 lb

## 2015-05-22 DIAGNOSIS — E559 Vitamin D deficiency, unspecified: Secondary | ICD-10-CM

## 2015-05-22 DIAGNOSIS — I10 Essential (primary) hypertension: Secondary | ICD-10-CM | POA: Diagnosis not present

## 2015-05-22 DIAGNOSIS — E119 Type 2 diabetes mellitus without complications: Secondary | ICD-10-CM

## 2015-05-22 DIAGNOSIS — M25462 Effusion, left knee: Secondary | ICD-10-CM | POA: Diagnosis not present

## 2015-05-22 DIAGNOSIS — E039 Hypothyroidism, unspecified: Secondary | ICD-10-CM

## 2015-05-22 DIAGNOSIS — E785 Hyperlipidemia, unspecified: Secondary | ICD-10-CM

## 2015-05-22 DIAGNOSIS — Z1159 Encounter for screening for other viral diseases: Secondary | ICD-10-CM

## 2015-05-22 DIAGNOSIS — E876 Hypokalemia: Secondary | ICD-10-CM

## 2015-05-22 DIAGNOSIS — Z1212 Encounter for screening for malignant neoplasm of rectum: Secondary | ICD-10-CM | POA: Diagnosis not present

## 2015-05-22 DIAGNOSIS — Z6832 Body mass index (BMI) 32.0-32.9, adult: Secondary | ICD-10-CM

## 2015-05-22 LAB — BAYER DCA HB A1C WAIVED: HB A1C: 6.2 % (ref <7.0)

## 2015-05-22 MED ORDER — METHYLPREDNISOLONE ACETATE 40 MG/ML IJ SUSP
40.0000 mg | Freq: Once | INTRAMUSCULAR | Status: AC
  Administered 2015-05-22: 40 mg via INTRA_ARTICULAR

## 2015-05-22 MED ORDER — BUPIVACAINE HCL 0.25 % IJ SOLN
1.0000 mL | Freq: Once | INTRAMUSCULAR | Status: AC
  Administered 2015-05-22: 1 mL via INTRA_ARTICULAR

## 2015-05-22 NOTE — Patient Instructions (Signed)

## 2015-05-22 NOTE — Addendum Note (Signed)
Addended by: [name] on: 05/22/2015 08:57 AM   Modules accepted: Orders

## 2015-05-22 NOTE — Progress Notes (Signed)
Subjective:    Patient ID: Mckenzie Leonard, female    DOB: 1950-04-25, 65 y.o.   MRN: [id number]  Patient here today for chronic disease follow up. patient has a complain of flare-up of her left knee pain, she is requesting for a cortizone shot, she reported having it in the past and it helped.  Hyperlipidemia This is a chronic problem. The current episode started more than 1 year ago. Recent lipid tests were reviewed and are variable. Pertinent negatives include no chest pain or shortness of breath. Current antihyperlipidemic treatment includes statins. The current treatment provides moderate improvement of lipids. Compliance problems include adherence to diet and adherence to exercise.  Risk factors for coronary artery disease include dyslipidemia, hypertension, diabetes mellitus, obesity and post-menopausal.  Hypertension This is a chronic problem. The current episode started more than 1 year ago. The problem has been waxing and waning since onset. The problem is uncontrolled. Pertinent negatives include no chest pain, headaches, palpitations or shortness of breath. Risk factors for coronary artery disease include diabetes mellitus, dyslipidemia, obesity and post-menopausal state. Past treatments include angiotensin blockers and diuretics. The current treatment provides moderate improvement. Compliance problems include diet and exercise.  Hypertensive end-organ damage includes a thyroid problem.  Diabetes She presents for her follow-up diabetic visit. She has type 2 diabetes mellitus. No MedicAlert identification noted. Her disease course has been stable. Pertinent negatives for hypoglycemia include no headaches. Pertinent negatives for diabetes include no chest pain, no foot paresthesias, no polydipsia, no polyphagia and no polyuria. There are no hypoglycemic complications. There are no diabetic complications. Risk factors for coronary artery disease include diabetes mellitus, dyslipidemia, family  history, hypertension, obesity and post-menopausal. Current diabetic treatment includes diet. She is compliant with treatment most of the time. Her weight is stable. When asked about meal planning, she reported none. She has not had a previous visit with a dietitian. There is no change in her home blood glucose trend. Her breakfast blood glucose is taken between 8-9 am. Her breakfast blood glucose range is generally 130-140 mg/dl. Her overall blood glucose range is 130-140 mg/dl. An ACE inhibitor/angiotensin II receptor blocker is being taken. She does not see a podiatrist.Eye exam is not current.  Thyroid Problem Patient reports no palpitations. Her past medical history is significant for hyperlipidemia.  Hypokalemia Has history but os currently on no meds.  NO complainrt of lower ext cramping osteopenia Not doing much weight bearing exercises due to knee pain Vitamin d def Gets lots of sun exposure and takes vitamin d daily   Review of Systems  Constitutional: Negative.   Respiratory: Negative for shortness of breath.   Cardiovascular: Negative for chest pain and palpitations.  Endocrine: Negative for polydipsia, polyphagia and polyuria.  Musculoskeletal:       Pain in the left knee, painful with movement  Neurological: Negative for headaches.  Psychiatric/Behavioral: Negative.   All other systems reviewed and are negative.      Objective:   Physical Exam  Constitutional: She is oriented to person, place, and time. She appears well-developed and well-nourished.  HENT:  Nose: Nose normal.  Mouth/Throat: Oropharynx is clear and moist.  Eyes: EOM are normal.  Neck: Trachea normal, normal range of motion and full passive range of motion without pain. Neck supple. No JVD present. Carotid bruit is not present. No thyromegaly present.  Cardiovascular: Normal rate, regular rhythm, normal heart sounds and intact distal pulses.  Exam reveals no gallop and no friction rub.   No  murmur  heard. Pulmonary/Chest: Effort normal and breath sounds normal.  Abdominal: Soft. Bowel sounds are normal. She exhibits no distension and no mass. There is no tenderness.  Musculoskeletal: Normal range of motion. She exhibits tenderness.  Tenderness in left knee, and crepitus noted on joint movement- mild left knee joint effusion  Lymphadenopathy:    She has no cervical adenopathy.  Neurological: She is alert and oriented to person, place, and time. She has normal reflexes.  Skin: Skin is warm and dry.  Psychiatric: She has a normal mood and affect. Her behavior is normal. Judgment and thought content normal.    BP 124/83 mmHg  Pulse 76  Temp(Src) 98.5 F (36.9 C) (Oral)  Ht '5\' 3"'$  (1.6 m)  Wt 194 lb 6 oz (88.168 kg)  BMI 34.44 kg/m2   Procedure- left knee joint injection - patient tolerated well       Assessment & Plan:   1. Hyperlipidemia Low fat diet - Lipid panel  2. Type 2 diabetes mellitus, controlled Continue to watch carbs in diet - POCT glycosylated hemoglobin (Hb A1C) - CMP14+EGFR - POCT UA - Microalbumin - Microalbumin, urine  3. Essential hypertension Do not add salt to diet  4. Hypothyroidism, unspecified hypothyroidism type  5. Osteopenia Encouraged to do weight bearing exercises  6. Hypokalemia  7. BMI 32.0-32.9,adult Discussed diet and exercise for person with BMI >25 Will recheck weight in 3-6 months   8. Vitamin D deficiency  9. Left knee effusion- injection Rest Ice if needed [place] doctor appointment in on 06/02/15 Mammogram 05/25/15 Labs pending Health maintenance reviewed Diet and exercise encouraged Continue all meds Follow up  In 3 months  [place], FNP

## 2015-05-25 ENCOUNTER — Other Ambulatory Visit: Payer: Medicare Other

## 2015-05-25 ENCOUNTER — Ambulatory Visit (HOSPITAL_COMMUNITY)
Admission: RE | Admit: 2015-05-25 | Discharge: 2015-05-25 | Disposition: A | Discharge status: Home / Self Care | Payer: Medicare Other | Source: Ambulatory Visit | Attending: Family Medicine | Admitting: Family Medicine

## 2015-05-25 DIAGNOSIS — Z1212 Encounter for screening for malignant neoplasm of rectum: Secondary | ICD-10-CM | POA: Diagnosis not present

## 2015-05-25 DIAGNOSIS — Z1231 Encounter for screening mammogram for malignant neoplasm of breast: Secondary | ICD-10-CM | POA: Insufficient documentation

## 2015-05-25 LAB — HEPATITIS C ANTIBODY

## 2015-05-26 LAB — CMP14+EGFR
ALT: 23 IU/L (ref 0–32)
AST: 20 IU/L (ref 0–40)
Albumin/Globulin Ratio: 1.8 (ref 1.2–2.2)
Albumin: 4.7 g/dL (ref 3.6–4.8)
Alkaline Phosphatase: 77 IU/L (ref 39–117)
BUN/Creatinine Ratio: 22 (ref 11–26)
BUN: 16 mg/dL (ref 8–27)
Bilirubin Total: 0.4 mg/dL (ref 0.0–1.2)
CO2: 21 mmol/L (ref 18–29)
Calcium: 9.5 mg/dL (ref 8.7–10.3)
Chloride: 94 mmol/L — ABNORMAL LOW (ref 96–106)
Creatinine, Ser: 0.73 mg/dL (ref 0.57–1.00)
GFR calc Af Amer: 99 mL/min/1.73
GFR calc non Af Amer: 86 mL/min/1.73
Globulin, Total: 2.6 g/dL (ref 1.5–4.5)
Glucose: 98 mg/dL (ref 65–99)
Potassium: 4.1 mmol/L (ref 3.5–5.2)
Sodium: 141 mmol/L (ref 134–144)
Total Protein: 7.3 g/dL (ref 6.0–8.5)

## 2015-05-26 LAB — THYROID PANEL WITH TSH
Free Thyroxine Index: 1.9 (ref 1.2–4.9)
T3 Uptake Ratio: 25 % (ref 24–39)
T4, Total: 7.4 ug/dL (ref 4.5–12.0)
TSH: 1.27 u[IU]/mL (ref 0.450–4.500)

## 2015-05-26 LAB — LIPID PANEL
CHOL/HDL RATIO: 2.8 ratio (ref 0.0–4.4)
Cholesterol, Total: 135 mg/dL (ref 100–199)
HDL: 48 mg/dL (ref >39)
LDL CALC: 59 mg/dL (ref 0–99)
TRIGLYCERIDES: 139 mg/dL (ref 0–149)
VLDL Cholesterol Cal: 28 mg/dL (ref 5–40)

## 2015-05-26 LAB — VITAMIN D 25 HYDROXY (VIT D DEFICIENCY, FRACTURES): Vit D, 25-Hydroxy: 43.8 ng/mL (ref 30.0–100.0)

## 2015-05-26 LAB — HCV AB W REFLEX TO QUANT PCR

## 2015-05-29 LAB — FECAL OCCULT BLOOD, IMMUNOCHEMICAL: Fecal Occult Bld: NEGATIVE

## 2015-06-02 DIAGNOSIS — H52223 Regular astigmatism, bilateral: Secondary | ICD-10-CM | POA: Diagnosis not present

## 2015-06-02 DIAGNOSIS — H5203 Hypermetropia, bilateral: Secondary | ICD-10-CM | POA: Diagnosis not present

## 2015-06-02 DIAGNOSIS — H524 Presbyopia: Secondary | ICD-10-CM | POA: Diagnosis not present

## 2015-06-02 DIAGNOSIS — H2513 Age-related nuclear cataract, bilateral: Secondary | ICD-10-CM | POA: Diagnosis not present

## 2015-06-02 LAB — HM DIABETES EYE EXAM

## 2015-06-23 ENCOUNTER — Other Ambulatory Visit: Payer: Self-pay | Admitting: Nurse Practitioner

## 2015-08-10 ENCOUNTER — Other Ambulatory Visit: Payer: Self-pay | Admitting: Nurse Practitioner

## 2015-08-27 ENCOUNTER — Encounter: Payer: Self-pay | Admitting: Nurse Practitioner

## 2015-08-27 ENCOUNTER — Ambulatory Visit (INDEPENDENT_AMBULATORY_CARE_PROVIDER_SITE_OTHER): Payer: Medicare Other | Admitting: Nurse Practitioner

## 2015-08-27 VITALS — BP 142/85 | HR 75 | Temp 97.6°F | Ht 63.0 in | Wt 193.0 lb

## 2015-08-27 DIAGNOSIS — E119 Type 2 diabetes mellitus without complications: Secondary | ICD-10-CM | POA: Diagnosis not present

## 2015-08-27 DIAGNOSIS — I10 Essential (primary) hypertension: Secondary | ICD-10-CM

## 2015-08-27 DIAGNOSIS — E876 Hypokalemia: Secondary | ICD-10-CM

## 2015-08-27 DIAGNOSIS — E039 Hypothyroidism, unspecified: Secondary | ICD-10-CM

## 2015-08-27 DIAGNOSIS — E559 Vitamin D deficiency, unspecified: Secondary | ICD-10-CM

## 2015-08-27 DIAGNOSIS — E785 Hyperlipidemia, unspecified: Secondary | ICD-10-CM

## 2015-08-27 DIAGNOSIS — Z6832 Body mass index (BMI) 32.0-32.9, adult: Secondary | ICD-10-CM

## 2015-08-27 LAB — BAYER DCA HB A1C WAIVED: HB A1C (BAYER DCA - WAIVED): 5.8 % (ref <7.0)

## 2015-08-27 MED ORDER — ATORVASTATIN CALCIUM 40 MG PO TABS: 40.0000 mg | ORAL_TABLET | Freq: Every day | ORAL | Status: DC

## 2015-08-27 MED ORDER — VALSARTAN-HYDROCHLOROTHIAZIDE 320-12.5 MG PO TABS: 1.0000 | ORAL_TABLET | Freq: Every day | ORAL | Status: DC

## 2015-08-27 NOTE — Patient Instructions (Signed)
Edema °Edema is an abnormal buildup of fluids in your body tissues. Edema is somewhat dependent on gravity to pull the fluid to the lowest place in your body. That makes the condition more common in the legs and thighs (lower extremities). Painless swelling of the feet and ankles is common and becomes more likely as you get older. It is also common in looser tissues, like around your eyes.  °When the affected area is squeezed, the fluid may move out of that spot and leave a dent for a few moments. This dent is called pitting.  °CAUSES  °There are many possible causes of edema. Eating too much salt and being on your feet or sitting for a long time can cause edema in your legs and ankles. Hot weather may make edema worse. Common medical causes of edema include: °· Heart failure. °· Liver disease. °· Kidney disease. °· Weak blood vessels in your legs. °· Cancer. °· An injury. °· Pregnancy. °· Some medications. °· Obesity.  °SYMPTOMS  °Edema is usually painless. Your skin may look swollen or shiny.  °DIAGNOSIS  °Your health care provider may be able to diagnose edema by asking about your medical history and doing a physical exam. You may need to have tests such as X-rays, an electrocardiogram, or blood tests to check for medical conditions that may cause edema.  °TREATMENT  °Edema treatment depends on the cause. If you have heart, liver, or kidney disease, you need the treatment appropriate for these conditions. General treatment may include: °· Elevation of the affected body part above the level of your heart. °· Compression of the affected body part. Pressure from elastic bandages or support stockings squeezes the tissues and forces fluid back into the blood vessels. This keeps fluid from entering the tissues. °· Restriction of fluid and salt intake. °· Use of a water pill (diuretic). These medications are appropriate only for some types of edema. They pull fluid out of your body and make you urinate more often. This  gets rid of fluid and reduces swelling, but diuretics can have side effects. Only use diuretics as directed by your health care provider. °HOME CARE INSTRUCTIONS  °· Keep the affected body part above the level of your heart when you are lying down.   °· Do not sit still or stand for prolonged periods.   °· Do not put anything directly under your knees when lying down. °· Do not wear constricting clothing or garters on your upper legs.   °· Exercise your legs to work the fluid back into your blood vessels. This may help the swelling go down.   °· Wear elastic bandages or support stockings to reduce ankle swelling as directed by your health care provider.   °· Eat a low-salt diet to reduce fluid if your health care provider recommends it.   °· Only take medicines as directed by your health care provider.  °SEEK MEDICAL CARE IF:  °· Your edema is not responding to treatment. °· You have heart, liver, or kidney disease and notice symptoms of edema. °· You have edema in your legs that does not improve after elevating them.   °· You have sudden and unexplained weight gain. °SEEK IMMEDIATE MEDICAL CARE IF:  °· You develop shortness of breath or chest pain.   °· You cannot breathe when you lie down. °· You develop pain, redness, or warmth in the swollen areas.   °· You have heart, liver, or kidney disease and suddenly get edema. °· You have a fever and your symptoms suddenly get worse. °MAKE SURE YOU:  °·   Understand these instructions. °· Will watch your condition. °· Will get help right away if you are not doing well or get worse. °  °This information is not intended to replace advice given to you by your health care provider. Make sure you discuss any questions you have with your health care provider. °  °Document Released: 02/14/2005 Document Revised: 03/07/2014 Document Reviewed: 12/07/2012 °Elsevier Interactive Patient Education ©2016 [employer] Inc[employer] ° °

## 2015-08-27 NOTE — Addendum Note (Signed)
Addended by: [name] on: 08/27/2015 09:35 AM   Modules accepted: Orders, SmartSet

## 2015-08-27 NOTE — Progress Notes (Addendum)
Subjective:    Patient ID: Mckenzie Leonard, female    DOB: [birth year], 65 y.o.   MRN: [id number]  Patient here today for follow up of chronic medical problems.  Outpatient Encounter Prescriptions as of 08/27/2015  Medication Sig  . atorvastatin (LIPITOR) 40 MG tablet TAKE 1 TABLET DAILY  . Blood Glucose Monitoring Suppl (BLOOD GLUCOSE METER KIT AND SUPPLIES) KIT Dispense based on patient and insurance preference. Use to check BG once daily.  ICD 10 code = E11.9  . cholecalciferol (VITAMIN D) 1000 UNITS tablet Take 1,000 Units by mouth daily.  Marland Kitchen levothyroxine (SYNTHROID, LEVOTHROID) 50 MCG tablet TAKE 1 TABLET DAILY  . magnesium 30 MG tablet Take 30 mg by mouth 2 (two) times daily.  . Multiple Vitamin (MULTIVITAMIN WITH MINERALS) TABS tablet Take 1 tablet by mouth daily.  . Multiple Vitamins-Minerals (OSTEO COMPLEX PO) Take 1 tablet by mouth daily.  . naproxen sodium (ANAPROX) 220 MG tablet Take 220 mg by mouth 2 (two) times daily with a meal.  . ONE TOUCH ULTRA TEST test strip CHECK BLOOD SUGAR ONCE DAILY OR AS DIRECTED  . Turmeric 1053 MG TABS Take 1 tablet by mouth daily.  . valsartan-hydrochlorothiazide (DIOVAN-HCT) 320-12.5 MG tablet TAKE 1 TABLET IN MORNING   No facility-administered encounter medications on file as of 08/27/2015.      Hypertension This is a chronic problem. The current episode started more than 1 year ago. The problem has been waxing and waning since onset. The problem is uncontrolled. Pertinent negatives include no chest pain, headaches, palpitations or shortness of breath. Risk factors for coronary artery disease include diabetes mellitus, dyslipidemia, obesity and post-menopausal state. Past treatments include angiotensin blockers and diuretics. The current treatment provides moderate improvement. Compliance problems include diet and exercise.  Hypertensive end-organ damage includes a thyroid problem.  Diabetes She presents for her follow-up diabetic visit. She has  type 2 diabetes mellitus. No MedicAlert identification noted. Her disease course has been stable. Pertinent negatives for hypoglycemia include no headaches. Pertinent negatives for diabetes include no chest pain, no foot paresthesias, no polydipsia, no polyphagia and no polyuria. There are no hypoglycemic complications. There are no diabetic complications. Risk factors for coronary artery disease include diabetes mellitus, dyslipidemia, family history, hypertension, obesity and post-menopausal. Current diabetic treatment includes diet. She is compliant with treatment most of the time. Her weight is stable. When asked about meal planning, she reported none. She has not had a previous visit with a dietitian. There is no change in her home blood glucose trend. Her breakfast blood glucose is taken between 8-9 am. Her breakfast blood glucose range is generally 130-140 mg/dl. Her overall blood glucose range is 130-140 mg/dl. An ACE inhibitor/angiotensin II receptor blocker is being taken. She does not see a podiatrist.Eye exam is not current.  Hyperlipidemia This is a chronic problem. The current episode started more than 1 year ago. Recent lipid tests were reviewed and are variable. Pertinent negatives include no chest pain or shortness of breath. Current antihyperlipidemic treatment includes statins. The current treatment provides moderate improvement of lipids. Compliance problems include adherence to diet and adherence to exercise.  Risk factors for coronary artery disease include dyslipidemia, hypertension, diabetes mellitus, obesity and post-menopausal.  Thyroid Problem Patient reports no palpitations. Her past medical history is significant for hyperlipidemia.  Hypokalemia Has history but os currently on no meds.  NO complainrt of lower ext cramping osteopenia Not doing much weight bearing exercises due to knee pain Vitamin d def Gets lots  of sun exposure and takes vitamin d daily   Review of Systems    Constitutional: Negative.   Respiratory: Negative for shortness of breath.   Cardiovascular: Negative for chest pain and palpitations.  Endocrine: Negative for polydipsia, polyphagia and polyuria.  Musculoskeletal:       Pain in the left knee, painful with movement  Neurological: Negative for headaches.  Psychiatric/Behavioral: Negative.   All other systems reviewed and are negative.      Objective:   Physical Exam  Constitutional: She is oriented to person, place, and time. She appears well-developed and well-nourished.  HENT:  Nose: Nose normal.  Mouth/Throat: Oropharynx is clear and moist.  Eyes: EOM are normal.  Neck: Trachea normal, normal range of motion and full passive range of motion without pain. Neck supple. No JVD present. Carotid bruit is not present. No thyromegaly present.  Cardiovascular: Normal rate, regular rhythm, normal heart sounds and intact distal pulses.  Exam reveals no gallop and no friction rub.   No murmur heard. Pulmonary/Chest: Effort normal and breath sounds normal.  Abdominal: Soft. Bowel sounds are normal. She exhibits no distension and no mass. There is no tenderness.  Musculoskeletal: Normal range of motion. She exhibits edema (1+ edema bil lower ext). She exhibits no tenderness.  Lymphadenopathy:    She has no cervical adenopathy.  Neurological: She is alert and oriented to person, place, and time. She has normal reflexes.  Skin: Skin is warm and dry.  Psychiatric: She has a normal mood and affect. Her behavior is normal. Judgment and thought content normal.    BP 142/85 mmHg  Pulse 75  Temp(Src) 97.6 F (36.4 C) (Oral)  Ht '5\' 3"'$  (1.6 m)  Wt 193 lb (87.544 kg)  BMI 34.20 kg/m2  hgba1c 5.8% down from 6.2% 3 months ago      Assessment & Plan:  1. Type 2 diabetes mellitus without complication, without long-term current use of insulin (HCC) Continue to watch carbs in diiet - Bayer DCA Hb A1c Waived - Microalbumin / creatinine urine  ratio  2. Essential hypertension Do not add salt to diet - valsartan-hydrochlorothiazide (DIOVAN-HCT) 320-12.5 MG tablet; Take 1 tablet by mouth daily.  Dispense: 90 tablet; Refill: 1  3. Hypothyroidism, unspecified hypothyroidism type   4. Hyperlipidemia Low fat diet - atorvastatin (LIPITOR) 40 MG tablet; Take 1 tablet (40 mg total) by mouth daily.  Dispense: 90 tablet; Refill: 1  5. Hypokalemia  6. BMI 32.0-32.9,adult Discussed diet and exercise for person with BMI >25 Will recheck weight in 3-6 months  7. Vitamin D deficiency   Orders Placed This Encounter  Procedures  . Bayer DCA Hb A1c Waived  . Microalbumin / creatinine urine ratio  . CMP14+EGFR  . Lipid panel  . Thyroid Panel With TSH    Labs pending Health maintenance reviewed Diet and exercise encouraged Continue all meds Follow up  In 3 month   [place], FNP

## 2015-08-28 LAB — MICROALBUMIN / CREATININE URINE RATIO
CREATININE, UR: 41.2 mg/dL
MICROALB/CREAT RATIO: <7.3 mg/g creat (ref 0.0–30.0)
Microalbumin, Urine: <3 ug/mL

## 2015-11-17 ENCOUNTER — Other Ambulatory Visit: Payer: Self-pay | Admitting: Nurse Practitioner

## 2015-11-30 ENCOUNTER — Encounter: Payer: Self-pay | Admitting: Nurse Practitioner

## 2015-11-30 ENCOUNTER — Ambulatory Visit (INDEPENDENT_AMBULATORY_CARE_PROVIDER_SITE_OTHER): Payer: Medicare Other | Admitting: Nurse Practitioner

## 2015-11-30 VITALS — BP 132/80 | HR 66 | Temp 96.7°F | Ht 63.0 in | Wt 195.0 lb

## 2015-11-30 DIAGNOSIS — Z6832 Body mass index (BMI) 32.0-32.9, adult: Secondary | ICD-10-CM | POA: Diagnosis not present

## 2015-11-30 DIAGNOSIS — E119 Type 2 diabetes mellitus without complications: Secondary | ICD-10-CM

## 2015-11-30 DIAGNOSIS — E876 Hypokalemia: Secondary | ICD-10-CM

## 2015-11-30 DIAGNOSIS — M8580 Other specified disorders of bone density and structure, unspecified site: Secondary | ICD-10-CM

## 2015-11-30 DIAGNOSIS — E039 Hypothyroidism, unspecified: Secondary | ICD-10-CM | POA: Diagnosis not present

## 2015-11-30 DIAGNOSIS — E785 Hyperlipidemia, unspecified: Secondary | ICD-10-CM

## 2015-11-30 DIAGNOSIS — I10 Essential (primary) hypertension: Secondary | ICD-10-CM | POA: Diagnosis not present

## 2015-11-30 DIAGNOSIS — E559 Vitamin D deficiency, unspecified: Secondary | ICD-10-CM | POA: Diagnosis not present

## 2015-11-30 LAB — LIPID PANEL
CHOLESTEROL TOTAL: 114 mg/dL (ref 100–199)
Chol/HDL Ratio: 2.5 ratio units (ref 0.0–4.4)
HDL: 45 mg/dL (ref >39)
LDL Calculated: 46 mg/dL (ref 0–99)
Triglycerides: 117 mg/dL (ref 0–149)
VLDL Cholesterol Cal: 23 mg/dL (ref 5–40)

## 2015-11-30 LAB — BAYER DCA HB A1C WAIVED: HB A1C: 6.2 % (ref <7.0)

## 2015-11-30 NOTE — Progress Notes (Signed)
Subjective:    Patient ID: Mckenzie Leonard, female    DOB: 1950/03/01, 65 y.o.   MRN: [id number]  Patient here today for follow up of chronic medical problems.  Outpatient Encounter Prescriptions as of 11/30/2015  Medication Sig  . atorvastatin (LIPITOR) 40 MG tablet Take 1 tablet (40 mg total) by mouth daily.  . Blood Glucose Monitoring Suppl (BLOOD GLUCOSE METER KIT AND SUPPLIES) KIT Dispense based on patient and insurance preference. Use to check BG once daily.  ICD 10 code = E11.9  . cholecalciferol (VITAMIN D) 1000 UNITS tablet Take 1,000 Units by mouth daily.  Marland Kitchen levothyroxine (SYNTHROID, LEVOTHROID) 50 MCG tablet TAKE 1 TABLET DAILY  . magnesium 30 MG tablet Take 30 mg by mouth 2 (two) times daily.  . Multiple Vitamin (MULTIVITAMIN WITH MINERALS) TABS tablet Take 1 tablet by mouth daily.  . Multiple Vitamins-Minerals (OSTEO COMPLEX PO) Take 1 tablet by mouth daily.  . naproxen sodium (ANAPROX) 220 MG tablet Take 220 mg by mouth 2 (two) times daily with a meal.  . ONE TOUCH ULTRA TEST test strip USE TO check sugar ONCE daily OR PER directions  . Turmeric 1053 MG TABS Take 1 tablet by mouth daily.  . valsartan-hydrochlorothiazide (DIOVAN-HCT) 320-12.5 MG tablet Take 1 tablet by mouth daily.   No facility-administered encounter medications on file as of 11/30/2015.       Hypertension  This is a chronic problem. The current episode started more than 1 year ago. The problem has been waxing and waning since onset. The problem is uncontrolled. Pertinent negatives include no chest pain, headaches, palpitations or shortness of breath. Risk factors for coronary artery disease include diabetes mellitus, dyslipidemia, obesity and post-menopausal state. Past treatments include angiotensin blockers and diuretics. The current treatment provides moderate improvement. Compliance problems include diet and exercise.  Hypertensive end-organ damage includes a thyroid problem.  Diabetes  She presents for  her follow-up diabetic visit. She has type 2 diabetes mellitus. No MedicAlert identification noted. Her disease course has been stable. Pertinent negatives for hypoglycemia include no headaches. Pertinent negatives for diabetes include no chest pain, no foot paresthesias, no polydipsia, no polyphagia and no polyuria. There are no hypoglycemic complications. There are no diabetic complications. Risk factors for coronary artery disease include diabetes mellitus, dyslipidemia, family history, hypertension, obesity and post-menopausal. Current diabetic treatment includes diet. She is compliant with treatment most of the time. Her weight is stable. When asked about meal planning, she reported none. She has not had a previous visit with a dietitian. There is no change in her home blood glucose trend. Her breakfast blood glucose is taken between 8-9 am. Her breakfast blood glucose range is generally 130-140 mg/dl. Her overall blood glucose range is 130-140 mg/dl. An ACE inhibitor/angiotensin II receptor blocker is being taken. She does not see a podiatrist.Eye exam is not current.  Hyperlipidemia  This is a chronic problem. The current episode started more than 1 year ago. Recent lipid tests were reviewed and are variable. Pertinent negatives include no chest pain or shortness of breath. Current antihyperlipidemic treatment includes statins. The current treatment provides moderate improvement of lipids. Compliance problems include adherence to diet and adherence to exercise.  Risk factors for coronary artery disease include dyslipidemia, hypertension, diabetes mellitus, obesity and post-menopausal.  Thyroid Problem  Patient reports no palpitations. Her past medical history is significant for hyperlipidemia.  Hypokalemia Has history but os currently on no meds.  NO complainrt of lower ext cramping osteopenia Not doing much  weight bearing exercises due to knee pain Vitamin d def Gets lots of sun exposure and takes  vitamin d daily   Review of Systems  Constitutional: Negative.   Respiratory: Negative for shortness of breath.   Cardiovascular: Negative for chest pain and palpitations.  Endocrine: Negative for polydipsia, polyphagia and polyuria.  Musculoskeletal:       Pain in the left knee, painful with movement  Neurological: Negative for headaches.  Psychiatric/Behavioral: Negative.   All other systems reviewed and are negative.      Objective:   Physical Exam  Constitutional: She is oriented to person, place, and time. She appears well-developed and well-nourished.  HENT:  Nose: Nose normal.  Mouth/Throat: Oropharynx is clear and moist.  Eyes: EOM are normal.  Neck: Trachea normal, normal range of motion and full passive range of motion without pain. Neck supple. No JVD present. Carotid bruit is not present. No thyromegaly present.  Cardiovascular: Normal rate, regular rhythm, normal heart sounds and intact distal pulses.  Exam reveals no gallop and no friction rub.   No murmur heard. Pulmonary/Chest: Effort normal and breath sounds normal.  Abdominal: Soft. Bowel sounds are normal. She exhibits no distension and no mass. There is no tenderness.  Musculoskeletal: Normal range of motion. She exhibits edema (1+ edema bil lower ext). She exhibits no tenderness.  Lymphadenopathy:    She has no cervical adenopathy.  Neurological: She is alert and oriented to person, place, and time. She has normal reflexes.  Skin: Skin is warm and dry.  Psychiatric: She has a normal mood and affect. Her behavior is normal. Judgment and thought content normal.    BP 132/80 (BP Location: Left Arm, Cuff Size: Large)   Pulse 66   Temp (!) 96.7 F (35.9 C) (Oral)   Ht _0  (1.6 m)   Wt 195 lb (88.5 kg)   BMI 34.54 kg/m   hgba1c 6.2%  EKG- Kerry Hough, FNP       Assessment & Plan:  1. Essential hypertension Do not add salt to diet - Bayer DCA Hb A1c Waived  2. Hyperlipidemia,  unspecified hyperlipidemia type Low fat diet - Lipid panel  3. Hypothyroidism, unspecified type  4. Controlled type 2 diabetes mellitus without complication, without long-term current use of insulin (HCC) Continue to watch labs in diet - Bayer DCA Hb A1c Waived  5. Osteopenia, unspecified location weiht bearing exercises  6. BMI 32.0-32.9,adult Discussed diet and exercise for person with BMI >25 Will recheck weight in 3-6 months  7. Hypokalemia  8. Vitamin D deficiency    Labs pending Health maintenance reviewed Diet and exercise encouraged Continue all meds Follow up  In 3 months   [place], FNP

## 2015-11-30 NOTE — Patient Instructions (Signed)
Bone Health Bones protect organs, store calcium, and anchor muscles. Good health habits, such as eating nutritious foods and exercising regularly, are important for maintaining healthy bones. They can also help to prevent a condition that causes bones to lose density and become weak and brittle (osteoporosis). WHY IS BONE MASS IMPORTANT? Bone mass refers to the amount of bone tissue that you have. The higher your bone mass, the stronger your bones. An important step toward having healthy bones throughout life is to have strong and dense bones during childhood. A young adult who has a high bone mass is more likely to have a high bone mass later in life. Bone mass at its greatest it is called peak bone mass. A large decline in bone mass occurs in older adults. In women, it occurs about the time of menopause. During this time, it is important to practice good health habits, because if more bone is lost than what is replaced, the bones will become less healthy and more likely to break (fracture). If you find that you have a low bone mass, you may be able to prevent osteoporosis or further bone loss by changing your diet and lifestyle. HOW CAN I FIND OUT IF MY BONE MASS IS LOW? Bone mass can be measured with an X-ray test that is called a bone mineral density (BMD) test. This test is recommended for all women who are age 65 or older. It may also be recommended for men who are age 70 or older, or for people who are more likely to develop osteoporosis due to:  Having bones that break easily.  Having a long-term disease that weakens bones, such as kidney disease or rheumatoid arthritis.  Having menopause earlier than normal.  Taking medicine that weakens bones, such as steroids, thyroid hormones, or hormone treatment for breast cancer or prostate cancer.  Smoking.  Drinking three or more alcoholic drinks each day. WHAT ARE THE NUTRITIONAL RECOMMENDATIONS FOR HEALTHY BONES? To have healthy bones, you need  to get enough of the right minerals and vitamins. Most nutrition experts recommend getting these nutrients from the foods that you eat. Nutritional recommendations vary from person to person. Ask your health care provider what is healthy for you. Here are some general guidelines. Calcium Recommendations Calcium is the most important (essential) mineral for bone health. Most people can get enough calcium from their diet, but supplements may be recommended for people who are at risk for osteoporosis. Good sources of calcium include:  Dairy products, such as low-fat or nonfat milk, cheese, and yogurt.  Dark green leafy vegetables, such as bok choy and broccoli.  Calcium-fortified foods, such as orange juice, cereal, bread, soy beverages, and tofu products.  Nuts, such as almonds. Follow these recommended amounts for daily calcium intake:  Children, age 1-3: 700 mg.  Children, age 4-8: 1,000 mg.  Children, age 9-13: 1,300 mg.  Teens, age 14-18: 1,300 mg.  Adults, age 19-50: 1,000 mg.  Adults, age 51-70:  Men: 1,000 mg.  Women: 1,200 mg.  Adults, age 71 or older: 1,200 mg.  Pregnant and breastfeeding females:  Teens: 1,300 mg.  Adults: 1,000 mg. Vitamin D Recommendations Vitamin D is the most essential vitamin for bone health. It helps the body to absorb calcium. Sunlight stimulates the skin to make vitamin D, so be sure to get enough sunlight. If you live in a cold climate or you do not get outside often, your health care provider may recommend that you take vitamin D supplements. Good   sources of vitamin D in your diet include:  Egg yolks.  Saltwater fish.  Milk and cereal fortified with vitamin D. Follow these recommended amounts for daily vitamin D intake:  Children and teens, age 1-18: 600 international units.  Adults, age 50 or younger: 400-800 international units.  Adults, age 51 or older: 800-1,000 international units. Other Nutrients Other nutrients for bone  health include:  Phosphorus. This mineral is found in meat, poultry, dairy foods, nuts, and legumes. The recommended daily intake for adult men and adult women is 700 mg.  Magnesium. This mineral is found in seeds, nuts, dark green vegetables, and legumes. The recommended daily intake for adult men is 400-420 mg. For adult women, it is 310-320 mg.  Vitamin K. This vitamin is found in green leafy vegetables. The recommended daily intake is 120 mg for adult men and 90 mg for adult women. WHAT TYPE OF PHYSICAL ACTIVITY IS BEST FOR BUILDING AND MAINTAINING HEALTHY BONES? Weight-bearing and strength-building activities are important for building and maintaining peak bone mass. Weight-bearing activities cause muscles and bones to work against gravity. Strength-building activities increases muscle strength that supports bones. Weight-bearing and muscle-building activities include:  Walking and hiking.  Jogging and running.  Dancing.  Gym exercises.  Lifting weights.  Tennis and racquetball.  Climbing stairs.  Aerobics. Adults should get at least 30 minutes of moderate physical activity on most days. Children should get at least 60 minutes of moderate physical activity on most days. Ask your health care provide what type of exercise is best for you. WHERE CAN I FIND MORE INFORMATION? For more information, check out the following websites:  National Osteoporosis Foundation: http://nof.org/learn/basics  National Institutes of Health: http://www.niams.nih.gov/Health_Info/Bone/Bone_Health/bone_health_for_life.asp   This information is not intended to replace advice given to you by your health care provider. Make sure you discuss any questions you have with your health care provider.   Document Released: 05/07/2003 Document Revised: 07/01/2014 Document Reviewed: 02/19/2014 Elsevier Interactive Patient Education 2016 [employer]  

## 2016-01-29 ENCOUNTER — Other Ambulatory Visit: Payer: Self-pay | Admitting: Nurse Practitioner

## 2016-01-29 DIAGNOSIS — E785 Hyperlipidemia, unspecified: Secondary | ICD-10-CM

## 2016-01-29 DIAGNOSIS — I10 Essential (primary) hypertension: Secondary | ICD-10-CM

## 2016-02-25 ENCOUNTER — Other Ambulatory Visit: Payer: Self-pay | Admitting: Nurse Practitioner

## 2016-02-25 DIAGNOSIS — I10 Essential (primary) hypertension: Secondary | ICD-10-CM

## 2016-02-25 DIAGNOSIS — E785 Hyperlipidemia, unspecified: Secondary | ICD-10-CM

## 2016-03-10 ENCOUNTER — Ambulatory Visit (INDEPENDENT_AMBULATORY_CARE_PROVIDER_SITE_OTHER): Payer: Medicare Other | Admitting: Nurse Practitioner

## 2016-03-10 ENCOUNTER — Encounter: Payer: Self-pay | Admitting: Nurse Practitioner

## 2016-03-10 VITALS — BP 132/82 | HR 73 | Temp 97.2°F | Ht 63.0 in | Wt 190.0 lb

## 2016-03-10 DIAGNOSIS — E039 Hypothyroidism, unspecified: Secondary | ICD-10-CM

## 2016-03-10 DIAGNOSIS — E876 Hypokalemia: Secondary | ICD-10-CM | POA: Diagnosis not present

## 2016-03-10 DIAGNOSIS — E785 Hyperlipidemia, unspecified: Secondary | ICD-10-CM | POA: Diagnosis not present

## 2016-03-10 DIAGNOSIS — E119 Type 2 diabetes mellitus without complications: Secondary | ICD-10-CM

## 2016-03-10 DIAGNOSIS — Z6832 Body mass index (BMI) 32.0-32.9, adult: Secondary | ICD-10-CM | POA: Diagnosis not present

## 2016-03-10 DIAGNOSIS — M8580 Other specified disorders of bone density and structure, unspecified site: Secondary | ICD-10-CM | POA: Diagnosis not present

## 2016-03-10 DIAGNOSIS — E559 Vitamin D deficiency, unspecified: Secondary | ICD-10-CM | POA: Diagnosis not present

## 2016-03-10 DIAGNOSIS — E782 Mixed hyperlipidemia: Secondary | ICD-10-CM

## 2016-03-10 DIAGNOSIS — I10 Essential (primary) hypertension: Secondary | ICD-10-CM

## 2016-03-10 LAB — BAYER DCA HB A1C WAIVED: HB A1C (BAYER DCA - WAIVED): 5.7 % (ref <7.0)

## 2016-03-10 MED ORDER — ATORVASTATIN CALCIUM 40 MG PO TABS: 40.0000 mg | ORAL_TABLET | Freq: Every day | ORAL | 1 refills | Status: DC

## 2016-03-10 MED ORDER — VALSARTAN-HYDROCHLOROTHIAZIDE 320-12.5 MG PO TABS: 1.0000 | ORAL_TABLET | Freq: Every day | ORAL | 1 refills | Status: DC

## 2016-03-10 NOTE — Progress Notes (Signed)
Subjective:    Patient ID: Mckenzie Leonard, female    DOB: 11/11/1950, 67 y.o.   MRN: [id number]  Patient here today for follow up of chronic medical problems. No chnages since last visit. No complaints today.  Outpatient Encounter Prescriptions as of 03/10/2016  Medication Sig  . atorvastatin (LIPITOR) 40 MG tablet Take 1 Tablet by mouth once daily  . Blood Glucose Monitoring Suppl (BLOOD GLUCOSE METER KIT AND SUPPLIES) KIT Dispense based on patient and insurance preference. Use to check BG once daily.  ICD 10 code = E11.9  . cholecalciferol (VITAMIN D) 1000 UNITS tablet Take 1,000 Units by mouth daily.  Marland Kitchen levothyroxine (SYNTHROID, LEVOTHROID) 50 MCG tablet Take 1 Tablet by mouth once daily  . magnesium 30 MG tablet Take 30 mg by mouth 2 (two) times daily.  . Multiple Vitamin (MULTIVITAMIN WITH MINERALS) TABS tablet Take 1 tablet by mouth daily.  . Multiple Vitamins-Minerals (OSTEO COMPLEX PO) Take 1 tablet by mouth daily.  . naproxen sodium (ANAPROX) 220 MG tablet Take 220 mg by mouth 2 (two) times daily with a meal.  . ONE TOUCH ULTRA TEST test strip USE TO test sugar ONCE daily OR PER directions  . TURMERIC PO Take by mouth 2 (two) times daily.  . valsartan-hydrochlorothiazide (DIOVAN-HCT) 320-12.5 MG tablet Take 1 Tablet by mouth once daily  . [DISCONTINUED] Turmeric 1053 MG TABS Take 1 tablet by mouth daily.   No facility-administered encounter medications on file as of 03/10/2016.       Hypertension  This is a chronic problem. The current episode started more than 1 year ago. The problem has been waxing and waning since onset. The problem is uncontrolled. Pertinent negatives include no chest pain, headaches, palpitations or shortness of breath. Risk factors for coronary artery disease include diabetes mellitus, dyslipidemia, obesity and post-menopausal state. Past treatments include angiotensin blockers and diuretics. The current treatment provides moderate improvement. Compliance  problems include diet and exercise.  Hypertensive end-organ damage includes a thyroid problem.  Diabetes  She presents for her follow-up diabetic visit. She has type 2 diabetes mellitus. No MedicAlert identification noted. Her disease course has been stable. Pertinent negatives for hypoglycemia include no headaches. Pertinent negatives for diabetes include no chest pain, no foot paresthesias, no polydipsia, no polyphagia and no polyuria. There are no hypoglycemic complications. There are no diabetic complications. Risk factors for coronary artery disease include diabetes mellitus, dyslipidemia, family history, hypertension, obesity and post-menopausal. Current diabetic treatment includes diet. She is compliant with treatment most of the time. Her weight is stable. When asked about meal planning, she reported none. She has not had a previous visit with a dietitian. There is no change in her home blood glucose trend. Her breakfast blood glucose is taken between 8-9 am. Her breakfast blood glucose range is generally 130-140 mg/dl. Her overall blood glucose range is 130-140 mg/dl. An ACE inhibitor/angiotensin II receptor blocker is being taken. She does not see a podiatrist.Eye exam is not current.  Hyperlipidemia  This is a chronic problem. The current episode started more than 1 year ago. Recent lipid tests were reviewed and are variable. Pertinent negatives include no chest pain or shortness of breath. Current antihyperlipidemic treatment includes statins. The current treatment provides moderate improvement of lipids. Compliance problems include adherence to diet and adherence to exercise.  Risk factors for coronary artery disease include dyslipidemia, hypertension, diabetes mellitus, obesity and post-menopausal.  Thyroid Problem  Patient reports no palpitations. Her past medical history is significant for  hyperlipidemia.  Hypokalemia Has history but os currently on no meds.  NO complainrt of lower ext  cramping osteopenia Not doing much weight bearing exercises due to knee pain Vitamin d def Gets lots of sun exposure and takes vitamin d daily   Review of Systems  Constitutional: Negative.   Respiratory: Negative for shortness of breath.   Cardiovascular: Negative for chest pain and palpitations.  Endocrine: Negative for polydipsia, polyphagia and polyuria.  Musculoskeletal:       Pain in the left knee, painful with movement  Neurological: Negative for headaches.  Psychiatric/Behavioral: Negative.   All other systems reviewed and are negative.      Objective:   Physical Exam  Constitutional: She is oriented to person, place, and time. She appears well-developed and well-nourished.  HENT:  Nose: Nose normal.  Mouth/Throat: Oropharynx is clear and moist.  Eyes: EOM are normal.  Neck: Trachea normal, normal range of motion and full passive range of motion without pain. Neck supple. No JVD present. Carotid bruit is not present. No thyromegaly present.  Cardiovascular: Normal rate, regular rhythm, normal heart sounds and intact distal pulses.  Exam reveals no gallop and no friction rub.   No murmur heard. Pulmonary/Chest: Effort normal and breath sounds normal.  Abdominal: Soft. Bowel sounds are normal. She exhibits no distension and no mass. There is no tenderness.  Musculoskeletal: Normal range of motion. She exhibits edema (1+ edema bil lower ext). She exhibits no tenderness.  Lymphadenopathy:    She has no cervical adenopathy.  Neurological: She is alert and oriented to person, place, and time. She has normal reflexes.  Skin: Skin is warm and dry.  Psychiatric: She has a normal mood and affect. Her behavior is normal. Judgment and thought content normal.    BP 132/82   Pulse 73   Temp 97.2 F (36.2 C) (Oral)   Ht '5\' 3"'$  (1.6 m)   Wt 190 lb (86.2 kg)   BMI 33.66 kg/m   hgba1c 5.7%     Assessment & Plan:  1. Essential hypertension Low sodium diet - CMP14+EGFR -  valsartan-hydrochlorothiazide (DIOVAN-HCT) 320-12.5 MG tablet; Take 1 tablet by mouth daily.  Dispense: 90 tablet; Refill: 1  2. Hyperlipidemia, unspecified hyperlipidemia type Low fat in diet - Lipid panel - atorvastatin (LIPITOR) 40 MG tablet; Take 1 tablet (40 mg total) by mouth daily.  Dispense: 90 tablet; Refill: 1   3. Controlled type 2 diabetes mellitus without complication, without long-term current use of insulin (HCC) Continue to warch carbs  In diet - Bayer DCA Hb A1c Waived  4. Hypothyroidism, unspecified type  5. Osteopenia, unspecified location Weight bearing exercises  6. BMI 32.0-32.9,adult Discussed diet and exercise for person with BMI >25 Will recheck weight in 3-6 months  7. Hypokalemia  8. Vitamin D deficiency    Labs pending Health maintenance reviewed Diet and exercise encouraged Continue all meds Follow up  In 6 months   [place], FNP

## 2016-03-10 NOTE — Patient Instructions (Signed)
DASH Eating Plan DASH stands for "Dietary Approaches to Stop Hypertension." The DASH eating plan is a healthy eating plan that has been shown to reduce high blood pressure (hypertension). Additional health benefits may include reducing the risk of type 2 diabetes mellitus, heart disease, and stroke. The DASH eating plan may also help with weight loss. What do I need to know about the DASH eating plan? For the DASH eating plan, you will follow these general guidelines:  Choose foods with less than 150 milligrams of sodium per serving (as listed on the food label).  Use salt-free seasonings or herbs instead of table salt or sea salt.  Check with your health care provider or pharmacist before using salt substitutes.  Eat lower-sodium products. These are often labeled as "low-sodium" or "no salt added."  Eat fresh foods. Avoid eating a lot of canned foods.  Eat more vegetables, fruits, and low-fat dairy products.  Choose whole grains. Look for the word "whole" as the first word in the ingredient list.  Choose fish and skinless chicken or turkey more often than red meat. Limit fish, poultry, and meat to 6 oz (170 g) each day.  Limit sweets, desserts, sugars, and sugary drinks.  Choose heart-healthy fats.  Eat more home-cooked food and less restaurant, buffet, and fast food.  Limit fried foods.  Do not fry foods. Cook foods using methods such as baking, boiling, grilling, and broiling instead.  When eating at a restaurant, ask that your food be prepared with less salt, or no salt if possible. What foods can I eat? Seek help from a dietitian for individual calorie needs. Grains  Whole grain or whole wheat bread. Brown rice. Whole grain or whole wheat pasta. Quinoa, bulgur, and whole grain cereals. Low-sodium cereals. Corn or whole wheat flour tortillas. Whole grain cornbread. Whole grain crackers. Low-sodium crackers. Vegetables  Fresh or frozen vegetables (raw, steamed, roasted, or  grilled). Low-sodium or reduced-sodium tomato and vegetable juices. Low-sodium or reduced-sodium tomato sauce and paste. Low-sodium or reduced-sodium canned vegetables. Fruits  All fresh, canned (in natural juice), or frozen fruits. Meat and Other Protein Products  Ground beef (85% or leaner), grass-fed beef, or beef trimmed of fat. Skinless chicken or turkey. Ground chicken or turkey. Pork trimmed of fat. All fish and seafood. Eggs. Dried beans, peas, or lentils. Unsalted nuts and seeds. Unsalted canned beans. Dairy  Low-fat dairy products, such as skim or 1% milk, 2% or reduced-fat cheeses, low-fat ricotta or cottage cheese, or plain low-fat yogurt. Low-sodium or reduced-sodium cheeses. Fats and Oils  Tub margarines without trans fats. Light or reduced-fat mayonnaise and salad dressings (reduced sodium). Avocado. Safflower, olive, or canola oils. Natural peanut or almond butter. Other  Unsalted popcorn and pretzels. The items listed above may not be a complete list of recommended foods or beverages. Contact your dietitian for more options.  What foods are not recommended? Grains  White bread. White pasta. White rice. Refined cornbread. Bagels and croissants. Crackers that contain trans fat. Vegetables  Creamed or fried vegetables. Vegetables in a cheese sauce. Regular canned vegetables. Regular canned tomato sauce and paste. Regular tomato and vegetable juices. Fruits  Canned fruit in light or heavy syrup. Fruit juice. Meat and Other Protein Products  Fatty cuts of meat. Ribs, chicken wings, bacon, sausage, bologna, salami, chitterlings, fatback, hot dogs, bratwurst, and packaged luncheon meats. Salted nuts and seeds. Canned beans with salt. Dairy  Whole or 2% milk, cream, half-and-half, and cream cheese. Whole-fat or sweetened yogurt. Full-fat cheeses   or blue cheese. Nondairy creamers and whipped toppings. Processed cheese, cheese spreads, or cheese curds. Condiments  Onion and garlic  salt, seasoned salt, table salt, and sea salt. Canned and packaged gravies. Worcestershire sauce. Tartar sauce. Barbecue sauce. Teriyaki sauce. Soy sauce, including reduced sodium. Steak sauce. Fish sauce. Oyster sauce. Cocktail sauce. Horseradish. Ketchup and mustard. Meat flavorings and tenderizers. Bouillon cubes. Hot sauce. Tabasco sauce. Marinades. Taco seasonings. Relishes. Fats and Oils  Butter, stick margarine, lard, shortening, ghee, and bacon fat. Coconut, palm kernel, or palm oils. Regular salad dressings. Other  Pickles and olives. Salted popcorn and pretzels. The items listed above may not be a complete list of foods and beverages to avoid. Contact your dietitian for more information.  Where can I find more information? National Heart, Lung, and Blood Institute: www.nhlbi.nih.gov/health/health-topics/topics/dash/ This information is not intended to replace advice given to you by your health care provider. Make sure you discuss any questions you have with your health care provider. Document Released: 02/03/2011 Document Revised: 07/23/2015 Document Reviewed: 12/19/2012 Elsevier Interactive Patient Education  2017 Elsevier Inc.  

## 2016-03-11 LAB — LIPID PANEL
CHOLESTEROL TOTAL: 111 mg/dL (ref 100–199)
Chol/HDL Ratio: 2.9 ratio units (ref 0.0–4.4)
HDL: 38 mg/dL — AB (ref >39)
LDL CALC: 48 mg/dL (ref 0–99)
Triglycerides: 123 mg/dL (ref 0–149)
VLDL CHOLESTEROL CAL: 25 mg/dL (ref 5–40)

## 2016-03-11 LAB — CMP14+EGFR
ALBUMIN: 4.5 g/dL (ref 3.6–4.8)
ALT: 19 IU/L (ref 0–32)
AST: 17 IU/L (ref 0–40)
Albumin/Globulin Ratio: 1.9 (ref 1.2–2.2)
Alkaline Phosphatase: 78 IU/L (ref 39–117)
BUN/Creatinine Ratio: 18 (ref 12–28)
BUN: 11 mg/dL (ref 8–27)
Bilirubin Total: 0.7 mg/dL (ref 0.0–1.2)
CALCIUM: 8.8 mg/dL (ref 8.7–10.3)
CO2: 25 mmol/L (ref 18–29)
CREATININE: 0.62 mg/dL (ref 0.57–1.00)
Chloride: 98 mmol/L (ref 96–106)
GFR calc non Af Amer: 94 mL/min/{1.73_m2} (ref >59)
GFR, EST AFRICAN AMERICAN: 108 mL/min/{1.73_m2} (ref >59)
GLUCOSE: 101 mg/dL — AB (ref 65–99)
Globulin, Total: 2.4 g/dL (ref 1.5–4.5)
Potassium: 4.1 mmol/L (ref 3.5–5.2)
Sodium: 142 mmol/L (ref 134–144)
TOTAL PROTEIN: 6.9 g/dL (ref 6.0–8.5)

## 2016-04-12 ENCOUNTER — Other Ambulatory Visit: Payer: Self-pay | Admitting: Nurse Practitioner

## 2016-05-19 ENCOUNTER — Other Ambulatory Visit: Payer: Self-pay | Admitting: Family Medicine

## 2016-05-19 DIAGNOSIS — Z1231 Encounter for screening mammogram for malignant neoplasm of breast: Secondary | ICD-10-CM

## 2016-05-27 ENCOUNTER — Ambulatory Visit (HOSPITAL_COMMUNITY)
Admission: RE | Admit: 2016-05-27 | Discharge: 2016-05-27 | Disposition: A | Discharge status: Home / Self Care | Payer: Medicare Other | Source: Ambulatory Visit | Attending: Family Medicine | Admitting: Family Medicine

## 2016-05-27 DIAGNOSIS — Z1231 Encounter for screening mammogram for malignant neoplasm of breast: Secondary | ICD-10-CM | POA: Diagnosis not present

## 2016-06-06 DIAGNOSIS — H2513 Age-related nuclear cataract, bilateral: Secondary | ICD-10-CM | POA: Diagnosis not present

## 2016-06-06 DIAGNOSIS — H5203 Hypermetropia, bilateral: Secondary | ICD-10-CM | POA: Diagnosis not present

## 2016-06-06 DIAGNOSIS — H524 Presbyopia: Secondary | ICD-10-CM | POA: Diagnosis not present

## 2016-06-06 DIAGNOSIS — H52223 Regular astigmatism, bilateral: Secondary | ICD-10-CM | POA: Diagnosis not present

## 2016-06-06 LAB — HM DIABETES EYE EXAM

## 2016-07-26 ENCOUNTER — Other Ambulatory Visit: Payer: Self-pay | Admitting: Nurse Practitioner

## 2016-07-26 DIAGNOSIS — I10 Essential (primary) hypertension: Secondary | ICD-10-CM

## 2016-08-01 LAB — HM DIABETES EYE EXAM

## 2016-08-29 ENCOUNTER — Ambulatory Visit (INDEPENDENT_AMBULATORY_CARE_PROVIDER_SITE_OTHER): Payer: Medicare Other

## 2016-08-29 ENCOUNTER — Ambulatory Visit (INDEPENDENT_AMBULATORY_CARE_PROVIDER_SITE_OTHER): Payer: Medicare Other | Admitting: *Deleted

## 2016-08-29 VITALS — BP 111/70 | HR 70 | Ht 62.0 in | Wt 195.0 lb

## 2016-08-29 DIAGNOSIS — Z780 Asymptomatic menopausal state: Secondary | ICD-10-CM | POA: Diagnosis not present

## 2016-08-29 DIAGNOSIS — Z0000 Encounter for general adult medical examination without abnormal findings: Secondary | ICD-10-CM | POA: Diagnosis not present

## 2016-08-29 DIAGNOSIS — Z1211 Encounter for screening for malignant neoplasm of colon: Secondary | ICD-10-CM

## 2016-08-29 NOTE — Patient Instructions (Addendum)
  Mckenzie Leonard , Thank you for taking time to come for your Medicare Wellness Visit. I appreciate your ongoing commitment to your health goals. Please review the following plan we discussed and let me know if I can assist you in the future.   These are the goals we discussed: Goals    . Exercise 150 minutes per week (moderate activity)          Walk for 30 minutes daily         Return stool specimen card.   This is a list of the screening recommended for you and due dates:  Health Maintenance  Topic Date Due  . Colon Cancer Screening  07/02/2012  . DEXA scan (bone density measurement)  04/29/2016  . Stool Blood Test  05/24/2016  . Pneumonia vaccines (1 of 2 - PCV13) 11/29/2016*  . Hemoglobin A1C  09/07/2016  . Flu Shot  09/28/2016  . Pap Smear  11/20/2016  . Complete foot exam   03/10/2017  . Eye exam for diabetics  06/06/2017  . Mammogram  05/28/2018  . Tetanus Vaccine  02/17/2022  .  Hepatitis C: One time screening is recommended by Center for Disease Control  (CDC) for  adults born from [birth year] through 1967.   Completed  *Topic was postponed. The date shown is not the original due date.

## 2016-08-29 NOTE — Progress Notes (Addendum)
Subjective:   Mckenzie Leonard is a 66 y.o. female who presents for an Initial Medicare Annual Wellness Visit. Mckenzie Leonard is retired from the [employer]. She lives at home alone and enjoys doing crafts and cooking. She does not have any pets. She has two adult daughters, 3 grandchildren, and 1 great grandchild.   Review of Systems    Reports that her health is about the same as last year.  Cardiac Risk Factors include: diabetes mellitus;dyslipidemia;hypertension;sedentary lifestyle;obesity (BMI >30kg/m2)  Musculoskeletal: Left knee pain. Hx of arthritis. Has had steroid injections by Dr Luna Glasgow in the past and it worked well. Also takes generic Tylenol arthritis with good results but she doesn't like to take it often.   Other systems negative. Objective:    Today's Vitals   08/29/16 1446  BP: 111/70  Pulse: 70  Weight: 195 lb (88.5 kg)  Height: _0  (1.575 m)   Body mass index is 35.67 kg/m.   Current Medications (verified) Outpatient Encounter Prescriptions as of 08/29/2016  Medication Sig  . atorvastatin (LIPITOR) 40 MG tablet Take 1 tablet (40 mg total) by mouth daily.  . Blood Glucose Monitoring Suppl (BLOOD GLUCOSE METER KIT AND SUPPLIES) KIT Dispense based on patient and insurance preference. Use to check BG once daily.  ICD 10 code = E11.9  . cholecalciferol (VITAMIN D) 1000 UNITS tablet Take 1,000 Units by mouth daily.  Marland Kitchen levothyroxine (SYNTHROID, LEVOTHROID) 50 MCG tablet Take 1 Tablet by mouth once daily  . magnesium 30 MG tablet Take 30 mg by mouth 2 (two) times daily.  . Multiple Vitamins-Minerals (OSTEO COMPLEX PO) Take 1 tablet by mouth daily.  . naproxen sodium (ANAPROX) 220 MG tablet Take 220 mg by mouth 2 (two) times daily with a meal.  . ONE TOUCH ULTRA TEST test strip USE TO check sugar ONCE daily OR PER directions  . TURMERIC PO Take by mouth 2 (two) times daily.  . valsartan-hydrochlorothiazide (DIOVAN-HCT) 320-12.5 MG tablet Take 1 Tablet by mouth once  daily  . [DISCONTINUED] Multiple Vitamin (MULTIVITAMIN WITH MINERALS) TABS tablet Take 1 tablet by mouth daily.   No facility-administered encounter medications on file as of 08/29/2016.     Allergies (verified) Sulfa antibiotics; Statins; and Tetanus toxoids   History: Past Medical History:  Diagnosis Date  . Arthritis   . Diabetes mellitus without complication ([place])   . High cholesterol   . Hypertension   . Thyroid disease    Past Surgical History:  Procedure Laterality Date  . CARPAL TUNNEL RELEASE     Right  . CLOSED REDUCTION MANDIBLE  02/20/2012   Procedure: CLOSED REDUCTION MANDIBULAR;  Surgeon: Isac Caddy, DDS;  Location: [facility];  Service: Oral Surgery;  Laterality: Right;  Closed reduction zygonach arch fx  . KNEE CARTILAGE SURGERY     Right  . ORIF FACIAL FRACTURE  02/20/2012   Procedure: OPEN REDUCTION INTERNAL FIXATION (ORIF) MULTIPLE FACIAL FRACTURES;  Surgeon: Isac Caddy, DDS;  Location: [facility];  Service: Oral Surgery;  Laterality: Right;  ORIF Rt ZMC (zygomaxillary)   Family History  Problem Relation Age of Onset  . Arthritis Mother   . Heart disease Mother        enlarged heart  . Anemia Mother   . Stroke Mother   . Arthritis Father   . Diabetes Father   . Hypertension Father   . Cancer Sister        breast  . Diabetes Brother   . CAD  Brother   . Healthy Daughter   . Healthy Daughter    Social History   Occupational History  . Not on file.   Social History Main Topics  . Smoking status: Never Smoker  . Smokeless tobacco: Never Used  . Alcohol use No  . Drug use: No  . Sexual activity: Yes    Tobacco Counseling No tobacco use  Activities of Daily Living In your present state of health, do you have any difficulty performing the following activities: 08/29/2016  Hearing? N  Vision? N  Difficulty concentrating or making decisions? N  Walking or climbing stairs? Y  Dressing or bathing? N  Doing errands, shopping? N    Preparing Food and eating ? N  Using the Toilet? N  In the past six months, have you accidently leaked urine? N  Do you have problems with loss of bowel control? N  Managing your Medications? N  Managing your Finances? N  Housekeeping or managing your Housekeeping? N  Some recent data might be hidden    Immunizations and Health Maintenance Immunization History  Administered Date(s) Administered  . Tdap 02/18/2012   Health Maintenance Due  Topic Date Due  . COLONOSCOPY  07/02/2012  . DEXA SCAN  04/29/2016  . COLON CANCER SCREENING ANNUAL FOBT  05/24/2016    Patient Care Team: [name], FNP as PCP - General (Nurse Practitioner)  Venetia Night, OD-optometry   No hospitalizations, ER visits, or surgeries this past year.     Assessment:   This is a routine wellness examination for [name].   Hearing/Vision screen No hearing or vision deficits noted during visit. Eye exam 05/2016 and [employer] rep came to her home and performed a diabetic eye exam which was negative for retinopathy.   Dietary issues and exercise activities discussed: Current Exercise Habits: The patient does not participate in regular exercise at present, Exercise limited by: orthopedic condition(s) (Left knee pain)  Diet:  3 meals a day. Mixture of home cooked meals and eating out.   Goals    . Exercise 150 minutes per week (moderate activity)          Walk for 30 minutes daily        Depression Screen PHQ 2/9 Scores 08/29/2016 03/10/2016 11/30/2015 08/27/2015 11/21/2014 08/20/2014 04/28/2014  PHQ - 2 Score 0 0 0 0 0 0 0    Fall Risk Fall Risk  08/29/2016 03/10/2016 11/30/2015 08/27/2015 11/21/2014  Falls in the past year? _0     Cognitive Function: MMSE - Mini Mental State Exam 08/29/2016  Orientation to time 5  Orientation to Place 5  Registration 3  Attention/ Calculation 5  Recall 3  Language- name 2 objects 2  Language- repeat 1  Language- follow 3 step command 3  Language- read &  follow direction 1  Write a sentence 1  Copy design 1  Total score 30    normal exam    Screening Tests Health Maintenance  Topic Date Due  . COLONOSCOPY  07/02/2012  . DEXA SCAN  04/29/2016  . COLON CANCER SCREENING ANNUAL FOBT  05/24/2016  . PNA vac Low Risk Adult (1 of 2 - PCV13) 11/29/2016 (Originally 11/09/2013)  . HEMOGLOBIN A1C  09/07/2016  . INFLUENZA VACCINE  09/28/2016  . PAP SMEAR  11/20/2016  . FOOT EXAM  03/10/2017  . OPHTHALMOLOGY EXAM  06/06/2017  . MAMMOGRAM  05/28/2018  . TETANUS/TDAP  02/17/2022  . Hepatitis C Screening  Completed  -Discussed colonoscopy but  patient declined. She is concerned about the possibility of injury resulting from the colonoscopy. Recognized fear and attempted to alleviate concern but patient still declined. -Declined vaccines as well.  Plan:   Dexa done today CPE with pap scheduled for 10/2016. Keep appt with [name], FNP on 09/09/16. Advanced directives discussed but patient declined information FOBT given Patient will schedule an appt with Dr Luna Glasgow to f/u on knee pain Encouraged physical activity for at least 30 minutes daily  I have personally reviewed and noted the following in the patient's chart:   . Medical and social history . Use of alcohol, tobacco or illicit drugs  . Current medications and supplements . Functional ability and status . Nutritional status . Physical activity . Advanced directives . List of other physicians . Hospitalizations, surgeries, and ER visits in previous 12 months . Vitals . Screenings to include cognitive, depression, and falls . Referrals and appointments  In addition, I have reviewed and discussed with patient certain preventive protocols, quality metrics, and best practice recommendations. A written personalized care plan for preventive services as well as general preventive health recommendations were provided to patient.     Chong Sicilian, RN   08/29/2016   I have  reviewed and agree with the above AWV documentation.   Mary-Margaret Hassell Done, FNP

## 2016-09-08 ENCOUNTER — Ambulatory Visit (INDEPENDENT_AMBULATORY_CARE_PROVIDER_SITE_OTHER): Payer: Medicare Other | Admitting: Orthopaedic Surgery

## 2016-09-08 ENCOUNTER — Ambulatory Visit (INDEPENDENT_AMBULATORY_CARE_PROVIDER_SITE_OTHER): Payer: Medicare Other

## 2016-09-08 ENCOUNTER — Encounter: Payer: Self-pay | Admitting: Orthopaedic Surgery

## 2016-09-08 VITALS — BP 146/74 | HR 84 | Temp 98.1°F | Ht 62.0 in | Wt 194.0 lb

## 2016-09-08 DIAGNOSIS — G8929 Other chronic pain: Secondary | ICD-10-CM | POA: Diagnosis not present

## 2016-09-08 DIAGNOSIS — M25562 Pain in left knee: Secondary | ICD-10-CM

## 2016-09-08 NOTE — Progress Notes (Signed)
Subjective:    Patient ID: Mckenzie Leonard, female    DOB: 1950-05-31, 66 y.o.   MRN: [id number]  HPI She has had increasing pain and swelling of the left knee over the last three months or so getting worse.  She has no giving way, no locking.  Nothing seems to help.  She has tried NSAIDs, rest, heat, ice, brace, rubs.  She has no redness, no trauma.  She has no distal edema.  She has no other joint pains.   Review of Systems  HENT: Negative for congestion.   Respiratory: Negative for cough and shortness of breath.   Cardiovascular: Negative for chest pain and leg swelling.  Endocrine: Negative for cold intolerance.  Musculoskeletal: Positive for arthralgias, gait problem and joint swelling.  Allergic/Immunologic: Negative for environmental allergies.   Past Medical History:  Diagnosis Date  . Arthritis   . Diabetes mellitus without complication ([place])   . High cholesterol   . Hypertension   . Thyroid disease     Past Surgical History:  Procedure Laterality Date  . CARPAL TUNNEL RELEASE     Right  . CLOSED REDUCTION MANDIBLE  02/20/2012   Procedure: CLOSED REDUCTION MANDIBULAR;  Surgeon: Isac Caddy, DDS;  Location: [place];  Service: Oral Surgery;  Laterality: Right;  Closed reduction zygonach arch fx  . KNEE CARTILAGE SURGERY     Right  . ORIF FACIAL FRACTURE  02/20/2012   Procedure: OPEN REDUCTION INTERNAL FIXATION (ORIF) MULTIPLE FACIAL FRACTURES;  Surgeon: Isac Caddy, DDS;  Location: [place];  Service: Oral Surgery;  Laterality: Right;  ORIF Rt [facility] (zygomaxillary)    Current Outpatient Prescriptions on File Prior to Visit  Medication Sig Dispense Refill  . atorvastatin (LIPITOR) 40 MG tablet Take 1 tablet (40 mg total) by mouth daily. 90 tablet 1  . Blood Glucose Monitoring Suppl (BLOOD GLUCOSE METER KIT AND SUPPLIES) KIT Dispense based on patient and insurance preference. Use to check BG once daily.  ICD 10 code = E11.9 1 each 0  . cholecalciferol  (VITAMIN D) 1000 UNITS tablet Take 1,000 Units by mouth daily.    Marland Kitchen levothyroxine (SYNTHROID, LEVOTHROID) 50 MCG tablet Take 1 Tablet by mouth once daily 90 tablet 1  . magnesium 30 MG tablet Take 30 mg by mouth 2 (two) times daily.    . Multiple Vitamins-Minerals (OSTEO COMPLEX PO) Take 1 tablet by mouth daily.    . naproxen sodium (ANAPROX) 220 MG tablet Take 220 mg by mouth 2 (two) times daily with a meal.    . ONE TOUCH ULTRA TEST test strip USE TO check sugar ONCE daily OR PER directions 100 each 11  . TURMERIC PO Take by mouth 2 (two) times daily.    . valsartan-hydrochlorothiazide (DIOVAN-HCT) 320-12.5 MG tablet Take 1 Tablet by mouth once daily 90 tablet 0   No current facility-administered medications on file prior to visit.     Social History   Social History  . Marital status: Divorced    Spouse name: N/A  . Number of children: N/A  . Years of education: N/A   Occupational History  . Not on file.   Social History Main Topics  . Smoking status: Never Smoker  . Smokeless tobacco: Never Used  . Alcohol use No  . Drug use: No  . Sexual activity: Yes   Other Topics Concern  . Not on file   Social History Narrative  . No narrative on file    Family History  Problem Relation Age of Onset  . Arthritis Mother   . Heart disease Mother        enlarged heart  . Anemia Mother   . Stroke Mother   . Arthritis Father   . Diabetes Father   . Hypertension Father   . Cancer Sister        breast  . Diabetes Brother   . CAD Brother   . Healthy Daughter   . Healthy Daughter     BP (!) 146/74   Pulse 84   Temp 98.1 F (36.7 C)   Ht 5' 2" (1.575 m)   Wt 194 lb (88 kg)   BMI 35.48 kg/m      Objective:   Physical Exam  Constitutional: She is oriented to person, place, and time. She appears well-developed and well-nourished.  HENT:  Head: Normocephalic and atraumatic.  Eyes: Pupils are equal, round, and reactive to light. Conjunctivae and EOM are normal.  Neck:  Normal range of motion. Neck supple.  Cardiovascular: Normal rate, regular rhythm and intact distal pulses.   Pulmonary/Chest: Effort normal.  Abdominal: Soft.  Musculoskeletal: She exhibits tenderness (Left knee with slight effusion, ROM 0 to 100, crepitus, stable, more medial pain, limp to the left.  Right knee with crepitus and ROM 0 to 115.).  Neurological: She is alert and oriented to person, place, and time. She displays normal reflexes. No cranial nerve deficit. She exhibits normal muscle tone. Coordination normal.  Skin: Skin is warm and dry.  Psychiatric: She has a normal mood and affect. Her behavior is normal. Judgment and thought content normal.  Vitals reviewed.   X-rays of the left knee were done, reported separately.      Assessment & Plan:   Encounter Diagnosis  Name Primary?  . Chronic pain of left knee Yes   PROCEDURE NOTE:  The patient requests injections of the left knee , verbal consent was obtained.  The left knee was prepped appropriately after time out was performed.   Sterile technique was observed and injection of 1 cc of Depo-Medrol 40 mg with several cc's of plain xylocaine. Anesthesia was provided by ethyl chloride and a 20-gauge needle was used to inject the knee area. The injection was tolerated well.  A band aid dressing was applied.  The patient was advised to apply ice later today and tomorrow to the injection sight as needed.  I will have her see Dr. Aline Brochure for possible surgery.  She is agreeable.  Call if any problem.  Precautions discussed.   Electronically Signed Sanjuana Kava, MD 7/12/201810:54 AM

## 2016-09-09 ENCOUNTER — Ambulatory Visit (INDEPENDENT_AMBULATORY_CARE_PROVIDER_SITE_OTHER): Payer: Medicare Other | Admitting: Nurse Practitioner

## 2016-09-09 ENCOUNTER — Encounter: Payer: Self-pay | Admitting: Nurse Practitioner

## 2016-09-09 VITALS — BP 139/74 | HR 68 | Temp 98.0°F | Ht 62.0 in | Wt 195.0 lb

## 2016-09-09 DIAGNOSIS — E785 Hyperlipidemia, unspecified: Secondary | ICD-10-CM

## 2016-09-09 DIAGNOSIS — E559 Vitamin D deficiency, unspecified: Secondary | ICD-10-CM | POA: Diagnosis not present

## 2016-09-09 DIAGNOSIS — Z6832 Body mass index (BMI) 32.0-32.9, adult: Secondary | ICD-10-CM

## 2016-09-09 DIAGNOSIS — E039 Hypothyroidism, unspecified: Secondary | ICD-10-CM | POA: Diagnosis not present

## 2016-09-09 DIAGNOSIS — I10 Essential (primary) hypertension: Secondary | ICD-10-CM | POA: Diagnosis not present

## 2016-09-09 DIAGNOSIS — E782 Mixed hyperlipidemia: Secondary | ICD-10-CM | POA: Diagnosis not present

## 2016-09-09 DIAGNOSIS — E876 Hypokalemia: Secondary | ICD-10-CM | POA: Diagnosis not present

## 2016-09-09 DIAGNOSIS — E119 Type 2 diabetes mellitus without complications: Secondary | ICD-10-CM

## 2016-09-09 DIAGNOSIS — Z1211 Encounter for screening for malignant neoplasm of colon: Secondary | ICD-10-CM | POA: Diagnosis not present

## 2016-09-09 DIAGNOSIS — M8580 Other specified disorders of bone density and structure, unspecified site: Secondary | ICD-10-CM | POA: Diagnosis not present

## 2016-09-09 LAB — BAYER DCA HB A1C WAIVED: HB A1C: 6.1 % (ref <7.0)

## 2016-09-09 MED ORDER — LEVOTHYROXINE SODIUM 50 MCG PO TABS: 50.0000 ug | ORAL_TABLET | Freq: Every day | ORAL | 1 refills | Status: DC

## 2016-09-09 MED ORDER — VALSARTAN-HYDROCHLOROTHIAZIDE 320-12.5 MG PO TABS: 1.0000 | ORAL_TABLET | Freq: Every day | ORAL | 0 refills | Status: DC

## 2016-09-09 MED ORDER — ATORVASTATIN CALCIUM 40 MG PO TABS: 40.0000 mg | ORAL_TABLET | Freq: Every day | ORAL | 1 refills | Status: DC

## 2016-09-09 NOTE — Progress Notes (Signed)
 Subjective:    Patient ID: Mckenzie Leonard, female    DOB: 04/14/1950, 66 y.o.   MRN: [id number]  HPI  Ramsey W Machuca is here today for follow up of chronic medical problem.  Outpatient Encounter Prescriptions as of 09/09/2016  Medication Sig  . atorvastatin (LIPITOR) 40 MG tablet Take 1 tablet (40 mg total) by mouth daily.  . Blood Glucose Monitoring Suppl (BLOOD GLUCOSE METER KIT AND SUPPLIES) KIT Dispense based on patient and insurance preference. Use to check BG once daily.  ICD 10 code = E11.9  . cholecalciferol (VITAMIN D) 1000 UNITS tablet Take 1,000 Units by mouth daily.  . levothyroxine (SYNTHROID, LEVOTHROID) 50 MCG tablet Take 1 Tablet by mouth once daily  . magnesium 30 MG tablet Take 30 mg by mouth 2 (two) times daily.  . Multiple Vitamins-Minerals (OSTEO COMPLEX PO) Take 1 tablet by mouth daily.  . naproxen sodium (ANAPROX) 220 MG tablet Take 220 mg by mouth 2 (two) times daily with a meal.  . ONE TOUCH ULTRA TEST test strip USE TO check sugar ONCE daily OR PER directions  . TURMERIC PO Take by mouth 2 (two) times daily.  . valsartan-hydrochlorothiazide (DIOVAN-HCT) 320-12.5 MG tablet Take 1 Tablet by mouth once daily   No facility-administered encounter medications on file as of 09/09/2016.     1. Essential hypertension  No c/o chest pain,sob or headache. Patient has no way of checking blood pressure at home  2. Controlled type 2 diabetes mellitus without complication, without long-term current use of insulin (HCC) last hgba1c was 5.7%. Blood sugars have been running 130's on avaerage. No hypoglycemia  3. Hypothyroidism, unspecified type  No problems that she is aware of.  4. Osteopenia, unspecified location  Very little weight bearing exercises. dexa done yesterday - no change still osteopenia.  5. Vitamin D deficiency  Does not always remember to take vitamin d   6. Hypokalemia  No c/o of lower ext cramping  7. Hyperlipidemia, unspecified hyperlipidemia type  Does  not eat very many fried foods  8. BMI 32.0-32.9,adult   no recent weight changes    New complaints: None today     Review of Systems  Constitutional: Negative for activity change and appetite change.  HENT: Negative.   Eyes: Negative for pain.  Respiratory: Negative for shortness of breath.   Cardiovascular: Negative for chest pain, palpitations and leg swelling.  Gastrointestinal: Negative for abdominal pain.  Endocrine: Negative for polydipsia.  Genitourinary: Negative.   Skin: Negative for rash.  Neurological: Negative for dizziness, weakness and headaches.  Hematological: Does not bruise/bleed easily.  Psychiatric/Behavioral: Negative.   All other systems reviewed and are negative.      Objective:   Physical Exam  Constitutional: She is oriented to person, place, and time. She appears well-developed and well-nourished.  HENT:  Nose: Nose normal.  Mouth/Throat: Oropharynx is clear and moist.  Eyes: EOM are normal.  Neck: Trachea normal, normal range of motion and full passive range of motion without pain. Neck supple. No JVD present. Carotid bruit is not present. No thyromegaly present.  Cardiovascular: Normal rate, regular rhythm, normal heart sounds and intact distal pulses.  Exam reveals no gallop and no friction rub.   No murmur heard. Pulmonary/Chest: Effort normal and breath sounds normal.  Abdominal: Soft. Bowel sounds are normal. She exhibits no distension and no mass. There is no tenderness.  Musculoskeletal: Normal range of motion.  Lymphadenopathy:    She has no cervical adenopathy.  Neurological:   She is alert and oriented to person, place, and time. She has normal reflexes.  Skin: Skin is warm and dry.  Psychiatric: She has a normal mood and affect. Her behavior is normal. Judgment and thought content normal.    BP 139/74   Pulse 68   Temp 98 F (36.7 C) (Oral)   Ht 5' 2" (1.575 m)   Wt 195 lb (88.5 kg)   BMI 35.67 kg/m   hgba1c  6.1%      Assessment & Plan:  1. Essential hypertension Low sodium diet - CMP14+EGFR - valsartan-hydrochlorothiazide (DIOVAN-HCT) 320-12.5 MG tablet; Take 1 tablet by mouth daily.  Dispense: 90 tablet; Refill: 0  2. Controlled type 2 diabetes mellitus without complication, without long-term current use of insulin (HCC) Continue to watch carbsin diet - Bayer DCA Hb A1c Waived - Microalbumin / creatinine urine ratio  3. Hypothyroidism, unspecified type - Thyroid Panel With TSH - levothyroxine (SYNTHROID, LEVOTHROID) 50 MCG tablet; Take 1 tablet (50 mcg total) by mouth daily.  Dispense: 90 tablet; Refill: 1  4. Osteopenia, unspecified location Weight bearing exercises dexa results reviewed Vitamin d and calcium OTC daily  5. Vitamin D deficiency  6. Hypokalemia  7. Hyperlipidemia, unspecified hyperlipidemia type Low fat diet - Lipid panel- atorvastatin (LIPITOR) 40 MG tablet; Take 1 tablet (40 mg total) by mouth daily.  Dispense: 90 tablet; Refill: 1   8. BMI 32.0-32.9,adult Discussed diet and exercise for person with BMI >25 Will recheck weight in 3-6 months     Patient will schedule colonoscopy with [facility] pending Health maintenance reviewed Diet and exercise encouraged Continue all meds Follow up  In 3 months   [place], FNP

## 2016-09-09 NOTE — Addendum Note (Signed)
Addended by: Earlene Plater on: 09/09/2016 12:01 PM   Modules accepted: Orders

## 2016-09-09 NOTE — Patient Instructions (Signed)
Bone Health Bones protect organs, store calcium, and anchor muscles. Good health habits, such as eating nutritious foods and exercising regularly, are important for maintaining healthy bones. They can also help to prevent a condition that causes bones to lose density and become weak and brittle (osteoporosis). Why is bone mass important? Bone mass refers to the amount of bone tissue that you have. The higher your bone mass, the stronger your bones. An important step toward having healthy bones throughout life is to have strong and dense bones during childhood. A young adult who has a high bone mass is more likely to have a high bone mass later in life. Bone mass at its greatest it is called peak bone mass. A large decline in bone mass occurs in older adults. In women, it occurs about the time of menopause. During this time, it is important to practice good health habits, because if more bone is lost than what is replaced, the bones will become less healthy and more likely to break (fracture). If you find that you have a low bone mass, you may be able to prevent osteoporosis or further bone loss by changing your diet and lifestyle. How can I find out if my bone mass is low? Bone mass can be measured with an X-ray test that is called a bone mineral density (BMD) test. This test is recommended for all women who are age 65 or older. It may also be recommended for men who are age 70 or older, or for people who are more likely to develop osteoporosis due to:  Having bones that break easily.  Having a long-term disease that weakens bones, such as kidney disease or rheumatoid arthritis.  Having menopause earlier than normal.  Taking medicine that weakens bones, such as steroids, thyroid hormones, or hormone treatment for breast cancer or prostate cancer.  Smoking.  Drinking three or more alcoholic drinks each day.  What are the nutritional recommendations for healthy bones? To have healthy bones, you  need to get enough of the right minerals and vitamins. Most nutrition experts recommend getting these nutrients from the foods that you eat. Nutritional recommendations vary from person to person. Ask your health care provider what is healthy for you. Here are some general guidelines. Calcium Recommendations Calcium is the most important (essential) mineral for bone health. Most people can get enough calcium from their diet, but supplements may be recommended for people who are at risk for osteoporosis. Good sources of calcium include:  Dairy products, such as low-fat or nonfat milk, cheese, and yogurt.  Dark green leafy vegetables, such as bok choy and broccoli.  Calcium-fortified foods, such as orange juice, cereal, bread, soy beverages, and tofu products.  Nuts, such as almonds.  Follow these recommended amounts for daily calcium intake:  Children, age 1?3: 700 mg.  Children, age 4?8: 1,000 mg.  Children, age 9?13: 1,300 mg.  Teens, age 14?18: 1,300 mg.  Adults, age 19?50: 1,000 mg.  Adults, age 51?70: ? Men: 1,000 mg. ? Women: 1,200 mg.  Adults, age 71 or older: 1,200 mg.  Pregnant and breastfeeding females: ? Teens: 1,300 mg. ? Adults: 1,000 mg.  Vitamin D Recommendations Vitamin D is the most essential vitamin for bone health. It helps the body to absorb calcium. Sunlight stimulates the skin to make vitamin D, so be sure to get enough sunlight. If you live in a cold climate or you do not get outside often, your health care provider may recommend that you take vitamin   D supplements. Good sources of vitamin D in your diet include:  Egg yolks.  Saltwater fish.  Milk and cereal fortified with vitamin D.  Follow these recommended amounts for daily vitamin D intake:  Children and teens, age 1?18: 600 international units.  Adults, age 50 or younger: 400-800 international units.  Adults, age 51 or older: 800-1,000 international units.  Other Nutrients Other nutrients  for bone health include:  Phosphorus. This mineral is found in meat, poultry, dairy foods, nuts, and legumes. The recommended daily intake for adult men and adult women is 700 mg.  Magnesium. This mineral is found in seeds, nuts, dark green vegetables, and legumes. The recommended daily intake for adult men is 400?420 mg. For adult women, it is 310?320 mg.  Vitamin K. This vitamin is found in green leafy vegetables. The recommended daily intake is 120 mg for adult men and 90 mg for adult women.  What type of physical activity is best for building and maintaining healthy bones? Weight-bearing and strength-building activities are important for building and maintaining peak bone mass. Weight-bearing activities cause muscles and bones to work against gravity. Strength-building activities increases muscle strength that supports bones. Weight-bearing and muscle-building activities include:  Walking and hiking.  Jogging and running.  Dancing.  Gym exercises.  Lifting weights.  Tennis and racquetball.  Climbing stairs.  Aerobics.  Adults should get at least 30 minutes of moderate physical activity on most days. Children should get at least 60 minutes of moderate physical activity on most days. Ask your health care provide what type of exercise is best for you. Where can I find more information? For more information, check out the following websites:  National Osteoporosis Foundation: http://nof.org/learn/basics  National Institutes of Health: http://www.niams.nih.gov/Health_Info/Bone/Bone_Health/bone_health_for_life.asp  This information is not intended to replace advice given to you by your health care provider. Make sure you discuss any questions you have with your health care provider. Document Released: 05/07/2003 Document Revised: 09/04/2015 Document Reviewed: 02/19/2014 Elsevier Interactive Patient Education  2018 Elsevier Inc.  

## 2016-09-10 LAB — CMP14+EGFR
ALT: 27 IU/L (ref 0–32)
AST: 23 IU/L (ref 0–40)
Albumin/Globulin Ratio: 1.6 (ref 1.2–2.2)
Albumin: 4.6 g/dL (ref 3.6–4.8)
Alkaline Phosphatase: 67 IU/L (ref 39–117)
BUN/Creatinine Ratio: 18 (ref 12–28)
BUN: 12 mg/dL (ref 8–27)
Bilirubin Total: 0.6 mg/dL (ref 0.0–1.2)
CALCIUM: 9.2 mg/dL (ref 8.7–10.3)
CO2: 24 mmol/L (ref 20–29)
CREATININE: 0.68 mg/dL (ref 0.57–1.00)
Chloride: 99 mmol/L (ref 96–106)
GFR calc Af Amer: 105 mL/min/{1.73_m2} (ref >59)
GFR, EST NON AFRICAN AMERICAN: 91 mL/min/{1.73_m2} (ref >59)
Globulin, Total: 2.8 g/dL (ref 1.5–4.5)
Glucose: 107 mg/dL — ABNORMAL HIGH (ref 65–99)
POTASSIUM: 4.1 mmol/L (ref 3.5–5.2)
Sodium: 143 mmol/L (ref 134–144)
Total Protein: 7.4 g/dL (ref 6.0–8.5)

## 2016-09-10 LAB — LIPID PANEL
CHOL/HDL RATIO: 2.3 ratio (ref 0.0–4.4)
CHOLESTEROL TOTAL: 123 mg/dL (ref 100–199)
HDL: 53 mg/dL (ref >39)
LDL Calculated: 57 mg/dL (ref 0–99)
TRIGLYCERIDES: 65 mg/dL (ref 0–149)
VLDL Cholesterol Cal: 13 mg/dL (ref 5–40)

## 2016-09-10 LAB — THYROID PANEL WITH TSH
FREE THYROXINE INDEX: 1.8 (ref 1.2–4.9)
T3 UPTAKE RATIO: 27 % (ref 24–39)
T4, Total: 6.7 ug/dL (ref 4.5–12.0)
TSH: 1.92 u[IU]/mL (ref 0.450–4.500)

## 2016-09-10 LAB — FECAL OCCULT BLOOD, IMMUNOCHEMICAL: FECAL OCCULT BLD: NEGATIVE

## 2016-09-13 ENCOUNTER — Telehealth: Payer: Self-pay

## 2016-09-13 MED ORDER — OLMESARTAN MEDOXOMIL-HCTZ 40-12.5 MG PO TABS: 1.0000 | ORAL_TABLET | Freq: Every day | ORAL | 3 refills | Status: DC

## 2016-09-13 NOTE — Telephone Encounter (Signed)
Mckenzie Leonard called to notify us that Valsartan has been recalled and patient has been notified. They need a rx sent in for an alternative med. Please advise and send to pharmacy. Patient does not need to be contacted by [place].

## 2016-09-21 ENCOUNTER — Ambulatory Visit (INDEPENDENT_AMBULATORY_CARE_PROVIDER_SITE_OTHER): Payer: Medicare Other | Admitting: Orthopedic Surgery

## 2016-09-21 ENCOUNTER — Encounter: Payer: Self-pay | Admitting: Orthopedic Surgery

## 2016-09-21 ENCOUNTER — Other Ambulatory Visit: Payer: Self-pay | Admitting: *Deleted

## 2016-09-21 VITALS — BP 132/88 | HR 84 | Ht 62.0 in | Wt 195.0 lb

## 2016-09-21 DIAGNOSIS — M1712 Unilateral primary osteoarthritis, left knee: Secondary | ICD-10-CM | POA: Diagnosis not present

## 2016-09-21 DIAGNOSIS — Z01818 Encounter for other preprocedural examination: Secondary | ICD-10-CM

## 2016-09-21 NOTE — Progress Notes (Signed)
Preop evaluation  Chief Complaint  Patient presents with  . Follow-up    Recheck on left knee, referred by Dr. Luna Glasgow.    66 year old female with history of chronic left knee pain she says she's basically had enough of her knee hurting. She complains of diffuse knee pain some pain numbness and tingling behind her knee and into her calf. Her knee pain she rates as severe dull aching and worse with activity and increases with certain times of getting up and getting down. Her prior treatments have included injection and oral medication and activity modification    Review of Systems  Constitutional: Negative for weight loss.  Respiratory: Negative for shortness of breath.   Cardiovascular: Negative for chest pain.  Skin: Negative for rash.  Neurological: Positive for tingling.     Past Medical History:  Diagnosis Date  . Arthritis   . Diabetes mellitus without complication ([place])   . High cholesterol   . Hypertension   . Thyroid disease     Past Surgical History:  Procedure Laterality Date  . CARPAL TUNNEL RELEASE     Right  . CLOSED REDUCTION MANDIBLE  02/20/2012   Procedure: CLOSED REDUCTION MANDIBULAR;  Surgeon: Isac Caddy, DDS;  Location: [place];  Service: Oral Surgery;  Laterality: Right;  Closed reduction zygonach arch fx  . KNEE CARTILAGE SURGERY     Right  . ORIF FACIAL FRACTURE  02/20/2012   Procedure: OPEN REDUCTION INTERNAL FIXATION (ORIF) MULTIPLE FACIAL FRACTURES;  Surgeon: Isac Caddy, DDS;  Location: [place];  Service: Oral Surgery;  Laterality: Right;  ORIF Rt [facility] (zygomaxillary)    Family History  Problem Relation Age of Onset  . Arthritis Mother   . Heart disease Mother        enlarged heart  . Anemia Mother   . Stroke Mother   . Arthritis Father   . Diabetes Father   . Hypertension Father   . Cancer Sister        breast  . Diabetes Brother   . CAD Brother   . Healthy Daughter   . Healthy Daughter    Social History  Substance  Use Topics  . Smoking status: Never Smoker  . Smokeless tobacco: Never Used  . Alcohol use No    BP 132/88   Pulse 84   Ht 5\' 2"  (1.575 m)   Wt 195 lb (88.5 kg)   BMI 35.67 kg/m   Physical Exam  Constitutional: She is oriented to person, place, and time. She appears well-developed and well-nourished.  Neurological: She is alert and oriented to person, place, and time. Gait abnormal.  Psychiatric: She has a normal mood and affect.  Vitals reviewed.   Left Knee Exam   Muscle Strength  Normal left knee strength  Right Knee Exam   Muscle Strength  Normal right knee strength  Comments:  The patient's right knee flexion is approximately 125 with full extension. She has no tenderness or swelling in the knee joint the knee is joint is stable when ligaments are tested. Muscle tone is normal skin is intact pulse and temperature are normal and sensation is normal         Encounter Diagnoses  Name Primary?  . Pre-op exam   . Primary osteoarthritis of left knee Yes     PLAN:   This procedure has been fully reviewed with the patient and written informed consent has been obtained.  The knee model was reviewed with the patient. We expect  2 days in the hospital then home or rehabilitation. Major risks of bleeding stiffness blood clot infection were discussed.  Plan is for left total knee replacement at her convenience

## 2016-09-28 ENCOUNTER — Ambulatory Visit (INDEPENDENT_AMBULATORY_CARE_PROVIDER_SITE_OTHER): Payer: Medicare Other

## 2016-09-28 DIAGNOSIS — M1712 Unilateral primary osteoarthritis, left knee: Secondary | ICD-10-CM | POA: Diagnosis not present

## 2016-09-28 NOTE — Addendum Note (Signed)
Addended by: Baldomero Lamy B on: 09/28/2016 09:43 AM   Modules accepted: Orders

## 2016-09-29 ENCOUNTER — Other Ambulatory Visit: Payer: Self-pay | Admitting: *Deleted

## 2016-09-29 DIAGNOSIS — Z96652 Presence of left artificial knee joint: Secondary | ICD-10-CM

## 2016-10-04 ENCOUNTER — Encounter: Payer: Self-pay | Admitting: *Deleted

## 2016-10-04 NOTE — Patient Instructions (Signed)
Mckenzie Leonard  10/04/2016     @PREFPERIOPPHARMACY @   Your procedure is scheduled on  10/12/2016 .  Report to [facility] at  615  A.M.  Call this number if you have problems the morning of surgery:  [phone]   Remember:  Do not eat food or drink liquids after midnight.  Take these medicines the morning of surgery with A SIP OF WATER  Synthroid, benicar.   Do not wear jewelry, make-up or nail polish.  Do not wear lotions, powders, or perfumes, or deoderant.  Do not shave 48 hours prior to surgery.  Men may shave face and neck.  Do not bring valuables to the hospital.  [facility] is not responsible for any belongings or valuables.  Contacts, dentures or bridgework may not be worn into surgery.  Leave your suitcase in the car.  After surgery it may be brought to your room.  For patients admitted to the hospital, discharge time will be determined by your treatment team.  Patients discharged the day of surgery will not be allowed to drive home.   Name and phone number of your driver:   family Special instructions:  None  Please read over the following fact sheets that you were given. Anesthesia Post-op Instructions and Care and Recovery After Surgery       Total Knee Replacement Total knee replacement is a procedure to replace the knee joint with an artificial (prosthetic) knee joint. The purpose of this surgery is to reduce knee pain and improve knee function. The prosthetic knee joint (prosthesis) is usually made of metal and plastic. It replaces parts of the thigh bone (femur), lower leg bone (tibia), and kneecap (patella) that are removed during the procedure. Tell a health care provider about:  Any allergies you have.  All medicines you are taking, including vitamins, herbs, eye drops, creams, and over-the-counter medicines.  Any problems you or family members have had with anesthetic medicines.  Any blood disorders you have.  Any  surgeries you have had.  Any medical conditions you have.  Whether you are pregnant or may be pregnant. What are the risks? Generally, this is a safe procedure. However, problems may occur, including:  Infection.  Bleeding.  Allergic reactions to medicines.  Damage to other structures or organs.  Decreased range of motion of the knee.  Instability of the knee.  Loosening of the prosthetic joint.  Knee pain that does not go away (chronic pain).  What happens before the procedure?  Ask your health care provider about: ? Changing or stopping your regular medicines. This is especially important if you are taking diabetes medicines or blood thinners. ? Taking medicines such as aspirin and ibuprofen. These medicines can thin your blood. Do not take these medicines before your procedure if your health care provider instructs you not to.  Have dental care and routine cleanings completed before your procedure. Plan to not have dental work done for 3 months after your procedure. Germs from anywhere in your body, including your mouth, can travel to your new joint and infect it.  Follow instructions from your health care provider about eating or drinking restrictions.  Ask your health care provider how your surgical site will be marked or identified.  You may be given antibiotic medicine to help prevent infection.  If your health care provider prescribes physical therapy, do exercises as instructed.  Do not use any tobacco  products, such as cigarettes, chewing tobacco, or e-cigarettes. If you need help quitting, ask your health care provider.  You may have a physical exam.  You may have tests, such as: ? X-rays. ? MRI. ? CT scan. ? Bone scans.  You may have a blood or urine sample taken.  Plan to have someone take you home after the procedure.  If you will be going home right after the procedure, plan to have someone with you for at least 24 hours. It is recommended that you  have someone to help care for you for at least 4-6 weeks after your procedure. What happens during the procedure?  To reduce your risk of infection: ? Your health care team will wash or sanitize their hands. ? Your skin will be washed with soap.  An IV tube will be inserted into one of your veins.  You will be given one or more of the following: ? A medicine to help you relax (sedative). ? A medicine to numb the area (local anesthetic). ? A medicine to make you fall asleep (general anesthetic). ? A medicine that is injected into your spine to numb the area below and slightly above the injection site (spinal anesthetic). ? A medicine that is injected into an area of your body to numb everything below the injection site (regional anesthetic).  An incision will be made in your knee.  Damaged cartilage and bone will be removed from your femur, tibia, and patella.  Parts of the prosthesis (liners)will be placed over the areas of bone and cartilage that were removed. A metal liner will be placed over your femur, and plastic liners will be placed over your tibia and the underside of your patella.  One or more small tubes (drains) may be placed near your incision to help drain extra fluid from your surgical site.  Your incision will be closed with stitches (sutures), skin glue, or adhesive strips. Medicine may be applied to your incision.  A bandage (dressing) will be placed over your incision. The procedure may vary among health care providers and hospitals. What happens after the procedure?  Your blood pressure, heart rate, breathing rate, and blood oxygen level will be monitored often until the medicines you were given have worn off.  You may continue to receive fluids and medicines through an IV tube.  You will have some pain. Pain medicines will be available to help you.  You may have fluid coming from one or more drains in your incision.  You may have to wear compression  stockings. These stockings help to prevent blood clots and reduce swelling in your legs.  You will be encouraged to move around as much as possible.  You may be given a continuous passive motion machine to use at home. You will be shown how to use this machine.  Do not drive for 24 hours if you received a sedative. This information is not intended to replace advice given to you by your health care provider. Make sure you discuss any questions you have with your health care provider. Document Released: 05/23/2000 Document Revised: 10/19/2015 Document Reviewed: 01/21/2015 Elsevier Interactive Patient Education  2017 [place].  Total Knee Replacement, Care After These instructions give you information about caring for yourself after your procedure. Your doctor may also give you more specific instructions. Call your doctor if you have any problems or questions after your procedure. Follow these instructions at home: Medicines  Take over-the-counter and prescription medicines only as told by  your doctor.  If you were prescribed an antibiotic medicine, take it as told by your doctor. Do not stop taking the antibiotic even if you start to feel better.  If you were prescribed a blood thinner (anticoagulant), take it as told by your doctor. If you have a splint or brace:  Wear the splint or brace as told by your doctor. Remove it only as told by your doctor.  Loosen the splint or brace if your toes tingle, get numb, or turn cold and blue.  Do not let your splint or brace get wet if it is not waterproof.  Keep the splint or brace clean. Bathing   Do not take baths, swim, or use a hot tub until your doctor says it is okay. Ask your doctor if you can take showers. You may only be allowed to take sponge baths for bathing.  If you have a splint or brace that is not waterproof, cover it with a watertight covering when you take a bath or a shower.  Keep your bandage (dressing) dry until  your doctor says it can be taken off. Incision care and drain care  Check your cut from surgery (incision) and your drain every day for signs of infection. Check for: ? More redness, swelling, or pain. ? More fluid or blood. ? Warmth. ? Pus or a bad smell.  Follow instructions from your doctor about how to take care of your cut from surgery. Make sure you: ? Wash your hands with soap and water before you change your bandage. If you cannot use soap and water, use hand sanitizer. ? Change your bandage as told by your doctor. ? Leave stitches (sutures), skin glue, or skin tape (adhesive) strips in place. They may need to stay in place for 2 weeks or longer. If tape strips get loose and curl up, you may trim the loose edges. Do not remove tape strips completely unless your doctor says it is okay.  If you have a drain, follow instructions from your doctor about caring for it. Do not remove the drain tube or any bandages unless your doctor says it is okay. Managing pain, stiffness, and swelling   If directed, put ice on your knee. ? Put ice in a plastic bag. ? Place a towel between your skin and the bag. ? Leave the ice on for 20 minutes, 2-3 times per day.  If directed, apply heat to the affected area as often as told by your doctor. Use the heat source that your doctor recommends, such as a moist heat pack or a heating pad. ? Place a towel between your skin and the heat source. ? Leave the heat on for 20-30 minutes. ? Remove the heat if your skin turns bright red. This is especially important if you are unable to feel pain, heat, or cold. You may have a greater risk of getting burned.  Move your toes often to avoid stiffness and to lessen swelling.  Raise (elevate) your knee above the level of your heart while you are sitting or lying down.  Wear elastic knee support for as long as told by your doctor. Driving   Do not drive until your doctor says it is okay. Ask your doctor when it  is safe to drive if you have a splint or brace on your knee.  Do not drive or use heavy machinery while taking prescription pain medicine.  Do not drive for 24 hours if you received a sedative. Activity  Do not lift anything that is heavier than 10 lb (4.5 kg) until your doctor says it is okay.  Do not play contact sports until your doctor says it is okay.  Avoid high-impact activities, including running, jumping rope, and jumping jacks.  Avoid sitting for a long time without moving. Get up and move around at least every few hours.  If physical therapy was prescribed, do exercises as told by your doctor.  Return to your normal activities as told by your doctor. Ask your doctor what activities are safe for you. Safety  Do not use your leg to support your body weight until your doctor says that you can. Use crutches or a walker as told by your doctor. General instructions   Do not have any dental work done for at least 3 months after your surgery. When you do have dental work done, tell your dentist about your joint replacement.  Do not use any tobacco products, such as cigarettes, chewing tobacco, or e-cigarettes. If you need help quitting, ask your doctor.  Wear special socks (compression stockings) as told by your doctor.  If you have been sent home with a knee joint motion machine (continuous passive motion machine), use it as told by your doctor.  Drink enough fluid to keep your pee (urine) clear or pale yellow.  If you have been told to lose weight, follow instructions from your doctor about how to do this safely.  Keep all follow-up visits as told by your doctor. This is important. Contact a doctor if:  You have more redness, swelling, or pain around your cut from surgery or your drain.  You have more fluid or blood coming from your cut from surgery or your drain.  Your cut from surgery or your drain area feels warm to the touch.  You have pus or a bad smell coming  from your cut from surgery or your drain.  You have a fever.  Your cut breaks open after your doctor removes your stitches, skin glue, or skin tape strips.  Your new joint feels loose.  You have knee pain that does not go away. Get help right away if:  You have a rash.  You have pain in your calf or thigh.  You have swelling in your calf or thigh.  You have shortness of breath.  You have trouble breathing.  You have chest pain.  Your ability to move your knee is getting worse. This information is not intended to replace advice given to you by your health care provider. Make sure you discuss any questions you have with your health care provider. Document Released: 05/09/2011 Document Revised: 10/19/2015 Document Reviewed: 01/21/2015 Elsevier Interactive Patient Education  2017 [place].  Spinal Anesthesia and Epidural Anesthesia Spinal anesthesia and epidural anesthesia are methods of numbing the body. They are done by injecting numbing medicine (anesthetic) into the back, near the spinal cord. Spinal anesthesia is usually done to numb an area at and below the place where the injection is made. It is often used during surgeries of the pelvis, hips, legs, and lower abdomen. It begins to work almost immediately. Epidural anesthesia may be done to numb an area above or below the area where the injection is made. It is often used during childbirth and after major abdominal or chest surgery. It begins to work after 10-20 minutes. Tell a health care provider about:  Any allergies you have.  All medicines you are taking, including vitamins, herbs, eye drops, creams, and  over-the-counter medicines.  Any problems you or family members have had with anesthetic medicines.  Any blood disorders you have.  Any surgeries you have had.  Any medical conditions you have.  Whether you are pregnant or may be pregnant.  Any recent alcohol, tobacco, or drug use. What are the  risks? Generally, this is a safe procedure. However, problems may occur, including:  Headache.  A drop in blood pressure. In some cases, this can lead to a heart attack or a stroke.  Nerve damage.  Infection.  Allergic reaction to medicines.  Seizures.  Spinal fluid leak.  Bleeding around the injection site.  Inability to breathe. If this happens, a tube may be put into your windpipe (trachea) and a machine may be used to help you breathe until the anesthetic wears off.  Long-lasting numbness, pain, or loss of function of body parts.  Nausea and vomiting.  Dizziness and fainting.  Shivering.  Itching.  What happens before the procedure? Staying hydrated Follow instructions from your health care provider about hydration, which may include:  Up to 2 hours before the procedure - you may continue to drink clear liquids, such as water, clear fruit juice, black coffee, and plain tea.  Eating and drinking restrictions Follow instructions from your health care provider about eating and drinking, which may include:  8 hours before the procedure - stop eating heavy meals or foods such as meat, fried foods, or fatty foods.  6 hours before the procedure - stop eating light meals or foods, such as toast or cereal.  6 hours before the procedure - stop drinking milk or drinks that contain milk.  2 hours before the procedure - stop drinking clear liquids.  Medicine Ask your health care provider about:  Changing or stopping your regular medicines. This is especially important if you are taking diabetes medicines or blood thinners.  Changing or stopping your dietary supplements.  Taking medicines such as aspirin and ibuprofen. These medicines can thin your blood. Do not take these medicines before your procedure if your health care provider instructs you not to.  General instructions   Do not use any tobacco products, such as cigarettes, chewing tobacco, and electronic  cigarettes, for as long as possible before your procedure. If you need help quitting, ask your health care provider.  Ask your health care provider if you will have to stay overnight at the hospital or clinic.  If you will not have to stay overnight: ? Plan to have someone take you home from the hospital or clinic. ? Plan to have someone with you for 24 hours. What happens during the procedure?  A health care provider will put patches on your chest, a cuff around your arm, or a sensor device on your finger. These will be attached to monitors that allow your health care provider to watch your blood pressure, pulse, and oxygen levels to make sure that the anesthetic does not cause any problems.  An IV tube may be inserted into one of your veins. The tube will be used to give you fluids and medicines throughout the procedure as needed.  You may be given a medicine to help you relax (sedative).  You will be asked to sit up or to lie on your side with your knees and your chin bent toward your chest. These positions open up the space between the bones in your back, making it easier to inject the medicine.  The area of your back where the medicine will  be injected will be cleaned.  A medicine called a local anesthetic may be injected to numb the area where the spinal or epidural anesthetic will be injected.  A needle will be inserted between the bones of your back. While this is being done: ? Continue to breathe normally. ? Stay as still and quiet as you can. ? If you feel a tingling shock or pain going down your leg, tell your health care provider but try not to move.  The spinal or epidural anesthetic will be injected.  If you receive an epidural anesthetic and need more than one dose, a tiny, flexible tube (catheter) will be placed where the anesthetic was injected. Additional doses will be given through the catheter. If you need pain medicine after the procedure, the catheter will be kept in  place.  If an epidural catheter has not been placed in your injection site or left there, a small bandage (dressing) will be placed over the injection site. The procedure may vary among health care providers and hospitals. What happens after the procedure?  You will need to stay in bed until your health care provider says it safe for you to walk.  Your blood pressure, heart rate, breathing rate, and blood oxygen level will be monitored until the medicines you were given have worn off.  If you have a catheter, it will be removed when it is no longer needed.  Do not drive for 24 hours if you received a sedative.  It is common to have nausea and itching. There are medicines that can help with these side effects. It is also common to have: ? Sleepiness. ? Vomiting. ? Numbness or tingling in your legs. ? Trouble urinating. This information is not intended to replace advice given to you by your health care provider. Make sure you discuss any questions you have with your health care provider. Document Released: 05/07/2003 Document Revised: 07/28/2015 Document Reviewed: 06/08/2015 Elsevier Interactive Patient Education  2018 [employer].  Spinal Anesthesia and Epidural Anesthesia, Care After These instructions give you information about caring for yourself after your procedure. Your doctor may also give you more specific instructions. Call your doctor if you have any problems or questions after your procedure. Follow these instructions at home: For at least 24 hours after the procedure:   Do not: ? Do activities where you could fall or get hurt (injured). ? Drive. ? Use heavy machinery. ? Drink alcohol. ? Take sleeping pills or medicines that make you sleepy (drowsy). ? Make important decisions. ? Sign legal documents. ? Take care of children on your own.  Rest. Eating and drinking  If you throw up (vomit), drink water, juice, or soup when you can drink without throwing  up.  Make sure you do not feel like throwing up (are not nauseous) before you eat.  Follow the diet that is recommended by your doctor. General instructions  Have a responsible adult stay with you until you are awake and alert.  Take over-the-counter and prescription medicines only as told by your doctor.  If you smoke, do not smoke unless an adult is watching.  Keep all follow-up visits as told by your doctor. This is important. Contact a doctor if:  It has been more than one day since your procedure and you feel like throwing up.  It has been more than one day since your procedure and you throw up.  You have a rash. Get help right away if:  You have a fever.  You have a headache that lasts a long time.  You have a very bad headache.  Your vision is blurry.  You see two of a single object (double vision).  You are dizzy or light-headed.  You faint.  Your arms or legs tingle, feel weak, or get numb.  You have trouble breathing.  You cannot pee (urinate). This information is not intended to replace advice given to you by your health care provider. Make sure you discuss any questions you have with your health care provider. Document Released: 06/08/2015 Document Revised: 10/08/2015 Document Reviewed: 06/08/2015 Elsevier Interactive Patient Education  2018 [place] Anesthesia, Adult General anesthesia is the use of medicines to make a person "go to sleep" (be unconscious) for a medical procedure. General anesthesia is often recommended when a procedure:  Is long.  Requires you to be still or in an unusual position.  Is major and can cause you to lose blood.  Is impossible to do without general anesthesia.  The medicines used for general anesthesia are called general anesthetics. In addition to making you sleep, the medicines:  Prevent pain.  Control your blood pressure.  Relax your muscles.  Tell a health care provider about:  Any  allergies you have.  All medicines you are taking, including vitamins, herbs, eye drops, creams, and over-the-counter medicines.  Any problems you or family members have had with anesthetic medicines.  Types of anesthetics you have had in the past.  Any bleeding disorders you have.  Any surgeries you have had.  Any medical conditions you have.  Any history of heart or lung conditions, such as heart failure, sleep apnea, or chronic obstructive pulmonary disease (COPD).  Whether you are pregnant or may be pregnant.  Whether you use tobacco, alcohol, marijuana, or street drugs.  Any history of Armed forces logistics/support/administrative officer.  Any history of depression or anxiety. What are the risks? Generally, this is a safe procedure. However, problems may occur, including:  Allergic reaction to anesthetics.  Lung and heart problems.  Inhaling food or liquids from your stomach into your lungs (aspiration).  Injury to nerves.  Waking up during your procedure and being unable to move (rare).  Extreme agitation or a state of mental confusion (delirium) when you wake up from the anesthetic.  Air in the bloodstream, which can lead to stroke.  These problems are more likely to develop if you are having a major surgery or if you have an advanced medical condition. You can prevent some of these complications by answering all of your health care provider's questions thoroughly and by following all pre-procedure instructions. General anesthesia can cause side effects, including:  Nausea or vomiting  A sore throat from the breathing tube.  Feeling cold or shivery.  Feeling tired, washed out, or achy.  Sleepiness or drowsiness.  Confusion or agitation.  What happens before the procedure? Staying hydrated Follow instructions from your health care provider about hydration, which may include:  Up to 2 hours before the procedure - you may continue to drink clear liquids, such as water, clear fruit juice,  black coffee, and plain tea.  Eating and drinking restrictions Follow instructions from your health care provider about eating and drinking, which may include:  8 hours before the procedure - stop eating heavy meals or foods such as meat, fried foods, or fatty foods.  6 hours before the procedure - stop eating light meals or foods, such as toast or cereal.  6 hours before the procedure -  stop drinking milk or drinks that contain milk.  2 hours before the procedure - stop drinking clear liquids.  Medicines  Ask your health care provider about: ? Changing or stopping your regular medicines. This is especially important if you are taking diabetes medicines or blood thinners. ? Taking medicines such as aspirin and ibuprofen. These medicines can thin your blood. Do not take these medicines before your procedure if your health care provider instructs you not to. ? Taking new dietary supplements or medicines. Do not take these during the week before your procedure unless your health care provider approves them.  If you are told to take a medicine or to continue taking a medicine on the day of the procedure, take the medicine with sips of water. General instructions   Ask if you will be going home the same day, the following day, or after a longer hospital stay. ? Plan to have someone take you home. ? Plan to have someone stay with you for the first 24 hours after you leave the hospital or clinic.  For 3-6 weeks before the procedure, try not to use any tobacco products, such as cigarettes, chewing tobacco, and e-cigarettes.  You may brush your teeth on the morning of the procedure, but make sure to spit out the toothpaste. What happens during the procedure?  You will be given anesthetics through a mask and through an IV tube in one of your veins.  You may receive medicine to help you relax (sedative).  As soon as you are asleep, a breathing tube may be used to help you breathe.  An  anesthesia specialist will stay with you throughout the procedure. He or she will help keep you comfortable and safe by continuing to give you medicines and adjusting the amount of medicine that you get. He or she will also watch your blood pressure, pulse, and oxygen levels to make sure that the anesthetics do not cause any problems.  If a breathing tube was used to help you breathe, it will be removed before you wake up. The procedure may vary among health care providers and hospitals. What happens after the procedure?  You will wake up, often slowly, after the procedure is complete, usually in a recovery area.  Your blood pressure, heart rate, breathing rate, and blood oxygen level will be monitored until the medicines you were given have worn off.  You may be given medicine to help you calm down if you feel anxious or agitated.  If you will be going home the same day, your health care provider may check to make sure you can stand, drink, and urinate.  Your health care providers will treat your pain and side effects before you go home.  Do not drive for 24 hours if you received a sedative.  You may: ? Feel nauseous and vomit. ? Have a sore throat. ? Have mental slowness. ? Feel cold or shivery. ? Feel sleepy. ? Feel tired. ? Feel sore or achy, even in parts of your body where you did not have surgery. This information is not intended to replace advice given to you by your health care provider. Make sure you discuss any questions you have with your health care provider. Document Released: 05/24/2007 Document Revised: 07/28/2015 Document Reviewed: 01/29/2015 Elsevier Interactive Patient Education  2018 [place] Anesthesia, Adult, Care After These instructions provide you with information about caring for yourself after your procedure. Your health care provider may also give you more specific instructions.  Your treatment has been planned according to current medical  practices, but problems sometimes occur. Call your health care provider if you have any problems or questions after your procedure. What can I expect after the procedure? After the procedure, it is common to have:  Vomiting.  A sore throat.  Mental slowness.  It is common to feel:  Nauseous.  Cold or shivery.  Sleepy.  Tired.  Sore or achy, even in parts of your body where you did not have surgery.  Follow these instructions at home: For at least 24 hours after the procedure:  Do not: ? Participate in activities where you could fall or become injured. ? Drive. ? Use heavy machinery. ? Drink alcohol. ? Take sleeping pills or medicines that cause drowsiness. ? Make important decisions or sign legal documents. ? Take care of children on your own.  Rest. Eating and drinking  If you vomit, drink water, juice, or soup when you can drink without vomiting.  Drink enough fluid to keep your urine clear or pale yellow.  Make sure you have little or no nausea before eating solid foods.  Follow the diet recommended by your health care provider. General instructions  Have a responsible adult stay with you until you are awake and alert.  Return to your normal activities as told by your health care provider. Ask your health care provider what activities are safe for you.  Take over-the-counter and prescription medicines only as told by your health care provider.  If you smoke, do not smoke without supervision.  Keep all follow-up visits as told by your health care provider. This is important. Contact a health care provider if:  You continue to have nausea or vomiting at home, and medicines are not helpful.  You cannot drink fluids or start eating again.  You cannot urinate after 8-12 hours.  You develop a skin rash.  You have fever.  You have increasing redness at the site of your procedure. Get help right away if:  You have difficulty breathing.  You have  chest pain.  You have unexpected bleeding.  You feel that you are having a life-threatening or urgent problem. This information is not intended to replace advice given to you by your health care provider. Make sure you discuss any questions you have with your health care provider. Document Released: 05/23/2000 Document Revised: 07/20/2015 Document Reviewed: 01/29/2015 Elsevier Interactive Patient Education  [employer].

## 2016-10-04 NOTE — Progress Notes (Signed)
Faxed requested notes per [employer]. Awaiting approval for surgery.

## 2016-10-07 ENCOUNTER — Encounter (HOSPITAL_COMMUNITY)
Admission: RE | Admit: 2016-10-07 | Discharge: 2016-10-07 | Disposition: A | Discharge status: Home / Self Care | Payer: Medicare Other | Source: Ambulatory Visit | Attending: Orthopedic Surgery | Admitting: Orthopedic Surgery

## 2016-10-07 ENCOUNTER — Encounter (HOSPITAL_COMMUNITY): Payer: Self-pay

## 2016-10-07 DIAGNOSIS — Z01812 Encounter for preprocedural laboratory examination: Secondary | ICD-10-CM | POA: Diagnosis not present

## 2016-10-07 DIAGNOSIS — E876 Hypokalemia: Secondary | ICD-10-CM | POA: Insufficient documentation

## 2016-10-07 DIAGNOSIS — E039 Hypothyroidism, unspecified: Secondary | ICD-10-CM | POA: Diagnosis not present

## 2016-10-07 DIAGNOSIS — E785 Hyperlipidemia, unspecified: Secondary | ICD-10-CM | POA: Insufficient documentation

## 2016-10-07 DIAGNOSIS — M8580 Other specified disorders of bone density and structure, unspecified site: Secondary | ICD-10-CM | POA: Insufficient documentation

## 2016-10-07 DIAGNOSIS — E119 Type 2 diabetes mellitus without complications: Secondary | ICD-10-CM | POA: Diagnosis not present

## 2016-10-07 DIAGNOSIS — E559 Vitamin D deficiency, unspecified: Secondary | ICD-10-CM | POA: Diagnosis not present

## 2016-10-07 DIAGNOSIS — M1990 Unspecified osteoarthritis, unspecified site: Secondary | ICD-10-CM | POA: Insufficient documentation

## 2016-10-07 DIAGNOSIS — I10 Essential (primary) hypertension: Secondary | ICD-10-CM | POA: Insufficient documentation

## 2016-10-07 HISTORY — DX: Hypothyroidism, unspecified: E03.9

## 2016-10-07 HISTORY — DX: Other specified postprocedural states: Z98.890

## 2016-10-07 HISTORY — DX: Nausea with vomiting, unspecified: R11.2

## 2016-10-07 LAB — CBC WITH DIFFERENTIAL/PLATELET
BASOS ABS: 0 10*3/uL (ref 0.0–0.1)
Basophils Relative: 0 %
Eosinophils Absolute: 0.2 10*3/uL (ref 0.0–0.7)
Eosinophils Relative: 2 %
HCT: 38.8 % (ref 36.0–46.0)
HEMOGLOBIN: 13 g/dL (ref 12.0–15.0)
LYMPHS ABS: 1.8 10*3/uL (ref 0.7–4.0)
LYMPHS PCT: 24 %
MCH: 30 pg (ref 26.0–34.0)
MCHC: 33.5 g/dL (ref 30.0–36.0)
MCV: 89.4 fL (ref 78.0–100.0)
Monocytes Absolute: 0.4 10*3/uL (ref 0.1–1.0)
Monocytes Relative: 6 %
NEUTROS ABS: 5 10*3/uL (ref 1.7–7.7)
NEUTROS PCT: 68 %
PLATELETS: 283 10*3/uL (ref 150–400)
RBC: 4.34 MIL/uL (ref 3.87–5.11)
RDW: 14.7 % (ref 11.5–15.5)
WBC: 7.4 10*3/uL (ref 4.0–10.5)

## 2016-10-07 LAB — BASIC METABOLIC PANEL
ANION GAP: 11 (ref 5–15)
BUN: 16 mg/dL (ref 6–20)
CHLORIDE: 100 mmol/L — AB (ref 101–111)
CO2: 28 mmol/L (ref 22–32)
Calcium: 9.7 mg/dL (ref 8.9–10.3)
Creatinine, Ser: 0.7 mg/dL (ref 0.44–1.00)
Glucose, Bld: 89 mg/dL (ref 65–99)
POTASSIUM: 3.6 mmol/L (ref 3.5–5.1)
SODIUM: 139 mmol/L (ref 135–145)

## 2016-10-07 LAB — SURGICAL PCR SCREEN
MRSA, PCR: NEGATIVE
Staphylococcus aureus: NEGATIVE

## 2016-10-07 LAB — PROTIME-INR
INR: 0.97
Prothrombin Time: 12.9 seconds (ref 11.4–15.2)

## 2016-10-07 LAB — PREPARE RBC (CROSSMATCH)

## 2016-10-07 LAB — ABO/RH: ABO/RH(D): A POS

## 2016-10-07 LAB — APTT: APTT: 39 s — AB (ref 24–36)

## 2016-10-11 ENCOUNTER — Encounter: Payer: Self-pay | Admitting: *Deleted

## 2016-10-11 MED ORDER — TRANEXAMIC ACID 1000 MG/10ML IV SOLN
1000.0000 mg | INTRAVENOUS | Status: AC
  Administered 2016-10-12: 1000 mg via INTRAVENOUS
  Filled 2016-10-11: qty 10

## 2016-10-11 NOTE — Progress Notes (Signed)
Cpt [id number] approved Reference [id number]

## 2016-10-11 NOTE — H&P (Signed)
Chief Complaint  Patient presents with  . Follow-up      Recheck on left knee, referred by Dr. Luna Glasgow.      67 year old female with history of chronic left knee pain she says she's basically had enough of her knee hurting. She complains of diffuse knee pain some pain numbness and tingling behind her knee and into her calf. Her knee pain she rates as severe dull aching and worse with activity and increases with certain times of getting up and getting down. Her prior treatments have included injection and oral medication and activity modification     Review of Systems  Constitutional: Negative for weight loss.  Respiratory: Negative for shortness of breath.   Cardiovascular: Negative for chest pain.  Skin: Negative for rash.  Neurological: Positive for tingling.            Past Medical History:  Diagnosis Date  . Arthritis    . Diabetes mellitus without complication ([place])    . High cholesterol    . Hypertension    . Thyroid disease        Past Surgical History:  Procedure Laterality Date  . CARPAL TUNNEL RELEASE        Right  . CLOSED REDUCTION MANDIBLE   02/20/2012    Procedure: CLOSED REDUCTION MANDIBULAR;  Surgeon: Isac Caddy, DDS;  Location: [place];  Service: Oral Surgery;  Laterality: Right;  Closed reduction zygonach arch fx  . KNEE CARTILAGE SURGERY        Right  . ORIF FACIAL FRACTURE   02/20/2012    Procedure: OPEN REDUCTION INTERNAL FIXATION (ORIF) MULTIPLE FACIAL FRACTURES;  Surgeon: Isac Caddy, DDS;  Location: [facility];  Service: Oral Surgery;  Laterality: Right;  ORIF Rt ZMC (zygomaxillary)           Family History  Problem Relation Age of Onset  . Arthritis Mother    . Heart disease Mother          enlarged heart  . Anemia Mother    . Stroke Mother    . Arthritis Father    . Diabetes Father    . Hypertension Father    . Cancer Sister          breast  . Diabetes Brother    . CAD Brother    . Healthy Daughter    . Healthy Daughter           Social History  Substance Use Topics  . Smoking status: Never Smoker  . Smokeless tobacco: Never Used  . Alcohol use No      BP 132/88   Pulse 84   Ht 5\' 2"  (1.575 m)   Wt 195 lb (88.5 kg)   BMI 35.67 kg/m   Physical Exam  Constitutional: She appears well-developed and well-nourished.  Non-toxic appearance. No distress.  Eyes: Conjunctivae, EOM and lids are normal. Right eye exhibits no discharge and no exudate. Left eye exhibits no discharge and no exudate. Right conjunctiva is not injected. Left conjunctiva is not injected.  Neck: Trachea normal, normal range of motion and full passive range of motion without pain. Neck supple. No neck rigidity. Normal range of motion present. No thyroid mass and no thyromegaly present.  Cardiovascular: Intact distal pulses.   Pulses:      Radial pulses are 2+ on the right side, and 2+ on the left side.       Dorsalis pedis pulses are 2+ on the right side, and 2+ on  the left side.  Lymphadenopathy:       Right cervical: No superficial cervical adenopathy present.      Left cervical: No superficial cervical adenopathy present.    She has no axillary adenopathy.       Right: No supraclavicular adenopathy present.       Left: No supraclavicular adenopathy present.  Neurological: She is alert. She displays no atrophy and no tremor. No sensory deficit. She exhibits normal muscle tone. Gait normal. She displays no Babinski's sign on the right side. She displays no Babinski's sign on the left side.  Reflex Scores:      Bicep reflexes are 2+ on the right side and 2+ on the left side.      Patellar reflexes are 2+ on the right side and 2+ on the left side. Skin: Skin is warm, dry and intact. No abrasion, no bruising, no ecchymosis and no laceration noted. Rash is not nodular. She is not diaphoretic. No erythema.  Psychiatric: She has a normal mood and affect. Her speech is normal and behavior is normal. Judgment and thought content normal. She is  attentive.  Vitals reviewed.   Physical Exam  Constitutional: She is oriented to person, place, and time. She appears well-developed and well-nourished.  Neurological: She is alert and oriented to person, place, and time. Gait abnormal.  Psychiatric: She has a normal mood and affect.  Vitals reviewed.     Left Knee Exam    Muscle Strength  Normal left knee strength   Right Knee Exam    Muscle Strength  Normal right knee strength   Comments:  The patient's right knee flexion is approximately 125 with full extension. She has no tenderness or swelling in the knee joint the knee is joint is stable when ligaments are tested. Muscle tone is normal skin is intact pulse and temperature are normal and sensation is normal         Encounter Diagnoses  Name Primary?  . Pre-op exam    . Primary osteoarthritis of left knee Yes    X-ray show severe joint space narrowing medial compartment bone spurs cyst subchondral sclerosis varus alignment knee effusion     PLAN:    This procedure has been fully reviewed with the patient and written informed consent has been obtained.   The knee model was reviewed with the patient. We expect 2 days in the hospital then home or rehabilitation. Major risks of bleeding stiffness blood clot infection were discussed.   Plan is for left total knee replacement at her convenience

## 2016-10-12 ENCOUNTER — Encounter (HOSPITAL_COMMUNITY)
Admission: RE | Disposition: A | Discharge status: Home Health | Payer: Self-pay | Source: Ambulatory Visit | Attending: Orthopedic Surgery

## 2016-10-12 ENCOUNTER — Inpatient Hospital Stay (HOSPITAL_COMMUNITY)
Admission: RE | Admit: 2016-10-12 | Discharge: 2016-10-15 | DRG: 470 | Disposition: A | Discharge status: Home Health | Payer: Medicare Other | Source: Ambulatory Visit | Attending: Orthopedic Surgery | Admitting: Orthopedic Surgery

## 2016-10-12 ENCOUNTER — Encounter (HOSPITAL_COMMUNITY): Payer: Self-pay | Admitting: *Deleted

## 2016-10-12 ENCOUNTER — Inpatient Hospital Stay (HOSPITAL_COMMUNITY): Payer: Medicare Other

## 2016-10-12 ENCOUNTER — Inpatient Hospital Stay (HOSPITAL_COMMUNITY): Payer: Medicare Other | Admitting: Anesthesiology

## 2016-10-12 DIAGNOSIS — M1712 Unilateral primary osteoarthritis, left knee: Secondary | ICD-10-CM | POA: Diagnosis not present

## 2016-10-12 DIAGNOSIS — E7800 Pure hypercholesterolemia, unspecified: Secondary | ICD-10-CM | POA: Diagnosis not present

## 2016-10-12 DIAGNOSIS — Z96652 Presence of left artificial knee joint: Secondary | ICD-10-CM

## 2016-10-12 DIAGNOSIS — E119 Type 2 diabetes mellitus without complications: Secondary | ICD-10-CM | POA: Diagnosis present

## 2016-10-12 DIAGNOSIS — Z8249 Family history of ischemic heart disease and other diseases of the circulatory system: Secondary | ICD-10-CM

## 2016-10-12 DIAGNOSIS — Z471 Aftercare following joint replacement surgery: Secondary | ICD-10-CM | POA: Diagnosis not present

## 2016-10-12 DIAGNOSIS — Z833 Family history of diabetes mellitus: Secondary | ICD-10-CM

## 2016-10-12 DIAGNOSIS — Z8261 Family history of arthritis: Secondary | ICD-10-CM

## 2016-10-12 DIAGNOSIS — I10 Essential (primary) hypertension: Secondary | ICD-10-CM | POA: Diagnosis present

## 2016-10-12 DIAGNOSIS — R339 Retention of urine, unspecified: Secondary | ICD-10-CM | POA: Diagnosis not present

## 2016-10-12 HISTORY — PX: TOTAL KNEE ARTHROPLASTY: SHX125

## 2016-10-12 LAB — GLUCOSE, CAPILLARY
GLUCOSE-CAPILLARY: 133 mg/dL — AB (ref 65–99)
GLUCOSE-CAPILLARY: 121 mg/dL — AB (ref 65–99)
GLUCOSE-CAPILLARY: 164 mg/dL — AB (ref 65–99)
Glucose-Capillary: 102 mg/dL — ABNORMAL HIGH (ref 65–99)

## 2016-10-12 SURGERY — ARTHROPLASTY, KNEE, TOTAL
Anesthesia: Spinal | Site: Knee | Laterality: Left

## 2016-10-12 MED ORDER — ATORVASTATIN CALCIUM 40 MG PO TABS
40.0000 mg | ORAL_TABLET | Freq: Every day | ORAL | Status: DC
  Administered 2016-10-12 – 2016-10-14 (×3): 40 mg via ORAL
  Filled 2016-10-12 (×3): qty 1

## 2016-10-12 MED ORDER — METOCLOPRAMIDE HCL 5 MG/ML IJ SOLN
5.0000 mg | Freq: Three times a day (TID) | INTRAMUSCULAR | Status: DC | PRN
  Administered 2016-10-12: 10 mg via INTRAVENOUS
  Filled 2016-10-12: qty 2

## 2016-10-12 MED ORDER — FENTANYL CITRATE (PF) 100 MCG/2ML IJ SOLN
25.0000 ug | Freq: Once | INTRAMUSCULAR | Status: AC
  Administered 2016-10-12: 25 ug via INTRAVENOUS

## 2016-10-12 MED ORDER — TRANEXAMIC ACID 1000 MG/10ML IV SOLN
1000.0000 mg | Freq: Once | INTRAVENOUS | Status: AC
  Administered 2016-10-12: 1000 mg via INTRAVENOUS
  Filled 2016-10-12: qty 10

## 2016-10-12 MED ORDER — ACETAMINOPHEN 500 MG PO TABS
500.0000 mg | ORAL_TABLET | Freq: Once | ORAL | Status: AC
  Administered 2016-10-12: 500 mg via ORAL

## 2016-10-12 MED ORDER — STERILE WATER FOR IRRIGATION IR SOLN
Status: DC | PRN
  Administered 2016-10-12: 2000 mL

## 2016-10-12 MED ORDER — BUPIVACAINE-EPINEPHRINE (PF) 0.5% -1:200000 IJ SOLN
INTRAMUSCULAR | Status: DC | PRN
  Administered 2016-10-12: 30 mL via PERINEURAL

## 2016-10-12 MED ORDER — PREGABALIN 50 MG PO CAPS
50.0000 mg | ORAL_CAPSULE | Freq: Once | ORAL | Status: AC
  Administered 2016-10-12: 50 mg via ORAL

## 2016-10-12 MED ORDER — LEVOTHYROXINE SODIUM 50 MCG PO TABS
50.0000 ug | ORAL_TABLET | Freq: Every day | ORAL | Status: DC
  Administered 2016-10-13 – 2016-10-15 (×3): 50 ug via ORAL
  Filled 2016-10-12 (×3): qty 1

## 2016-10-12 MED ORDER — MIDAZOLAM HCL 2 MG/2ML IJ SOLN
1.0000 mg | INTRAMUSCULAR | Status: DC
  Administered 2016-10-12: 2 mg via INTRAVENOUS

## 2016-10-12 MED ORDER — METOCLOPRAMIDE HCL 10 MG PO TABS
5.0000 mg | ORAL_TABLET | Freq: Three times a day (TID) | ORAL | Status: DC | PRN
  Filled 2016-10-12: qty 2

## 2016-10-12 MED ORDER — MIDAZOLAM HCL 5 MG/5ML IJ SOLN
INTRAMUSCULAR | Status: DC | PRN
  Administered 2016-10-12 (×2): 1 mg via INTRAVENOUS

## 2016-10-12 MED ORDER — FENTANYL CITRATE (PF) 100 MCG/2ML IJ SOLN
INTRAMUSCULAR | Status: AC
  Filled 2016-10-12: qty 2

## 2016-10-12 MED ORDER — ACETAMINOPHEN 650 MG RE SUPP: 650.0000 mg | Freq: Four times a day (QID) | RECTAL | Status: DC | PRN

## 2016-10-12 MED ORDER — SODIUM CHLORIDE 0.9 % IV SOLN
INTRAVENOUS | Status: DC | PRN
  Administered 2016-10-12: 60 mL

## 2016-10-12 MED ORDER — MAGNESIUM OXIDE 400 (241.3 MG) MG PO TABS
400.0000 mg | ORAL_TABLET | Freq: Every day | ORAL | Status: DC
  Administered 2016-10-12 – 2016-10-15 (×4): 400 mg via ORAL
  Filled 2016-10-12 (×4): qty 1

## 2016-10-12 MED ORDER — CEFAZOLIN SODIUM-DEXTROSE 2-4 GM/100ML-% IV SOLN
INTRAVENOUS | Status: AC
  Filled 2016-10-12: qty 100

## 2016-10-12 MED ORDER — OLMESARTAN MEDOXOMIL-HCTZ 40-12.5 MG PO TABS: 1.0000 | ORAL_TABLET | Freq: Every day | ORAL | Status: DC

## 2016-10-12 MED ORDER — MAGNESIUM CITRATE PO SOLN: 1.0000 | Freq: Once | ORAL | Status: DC | PRN

## 2016-10-12 MED ORDER — SODIUM CHLORIDE 0.9 % IJ SOLN
INTRAMUSCULAR | Status: AC
Start: 2016-10-12 — End: 2016-10-12
  Filled 2016-10-12: qty 40

## 2016-10-12 MED ORDER — FENTANYL CITRATE (PF) 100 MCG/2ML IJ SOLN: 25.0000 ug | INTRAMUSCULAR | Status: DC | PRN

## 2016-10-12 MED ORDER — ONDANSETRON HCL 4 MG/2ML IJ SOLN
4.0000 mg | Freq: Four times a day (QID) | INTRAMUSCULAR | Status: DC | PRN
  Administered 2016-10-12 – 2016-10-13 (×2): 4 mg via INTRAVENOUS
  Filled 2016-10-12 (×2): qty 2

## 2016-10-12 MED ORDER — SENNOSIDES-DOCUSATE SODIUM 8.6-50 MG PO TABS: 1.0000 | ORAL_TABLET | Freq: Every evening | ORAL | Status: DC | PRN

## 2016-10-12 MED ORDER — MIDAZOLAM HCL 2 MG/2ML IJ SOLN
INTRAMUSCULAR | Status: AC
  Filled 2016-10-12: qty 2

## 2016-10-12 MED ORDER — SORBITOL 70 % SOLN: 30.0000 mL | Freq: Every day | Status: DC | PRN

## 2016-10-12 MED ORDER — CEFAZOLIN SODIUM-DEXTROSE 2-4 GM/100ML-% IV SOLN
2.0000 g | INTRAVENOUS | Status: AC
  Administered 2016-10-12: 2 g via INTRAVENOUS

## 2016-10-12 MED ORDER — IRBESARTAN 300 MG PO TABS
300.0000 mg | ORAL_TABLET | Freq: Every day | ORAL | Status: DC
  Administered 2016-10-13 – 2016-10-15 (×3): 300 mg via ORAL
  Filled 2016-10-12 (×3): qty 1

## 2016-10-12 MED ORDER — DIPHENHYDRAMINE HCL 12.5 MG/5ML PO ELIX: 12.5000 mg | ORAL_SOLUTION | ORAL | Status: DC | PRN

## 2016-10-12 MED ORDER — BUPIVACAINE IN DEXTROSE 0.75-8.25 % IT SOLN
INTRATHECAL | Status: AC
Start: 2016-10-12 — End: 2016-10-12
  Filled 2016-10-12: qty 2

## 2016-10-12 MED ORDER — HYDROMORPHONE HCL 1 MG/ML IJ SOLN
0.5000 mg | INTRAMUSCULAR | Status: DC | PRN
  Administered 2016-10-12 – 2016-10-13 (×4): 0.5 mg via INTRAVENOUS
  Filled 2016-10-12 (×4): qty 1

## 2016-10-12 MED ORDER — CEFAZOLIN SODIUM-DEXTROSE 2-4 GM/100ML-% IV SOLN
2.0000 g | Freq: Four times a day (QID) | INTRAVENOUS | Status: AC
  Administered 2016-10-12 (×2): 2 g via INTRAVENOUS
  Filled 2016-10-12 (×2): qty 100

## 2016-10-12 MED ORDER — ALUM & MAG HYDROXIDE-SIMETH 200-200-20 MG/5ML PO SUSP: 30.0000 mL | ORAL | Status: DC | PRN

## 2016-10-12 MED ORDER — PREGABALIN 50 MG PO CAPS
ORAL_CAPSULE | ORAL | Status: AC
  Filled 2016-10-12: qty 1

## 2016-10-12 MED ORDER — CHLORHEXIDINE GLUCONATE 4 % EX LIQD: 60.0000 mL | Freq: Once | CUTANEOUS | Status: DC

## 2016-10-12 MED ORDER — HYDROCODONE-ACETAMINOPHEN 5-325 MG PO TABS
1.0000 | ORAL_TABLET | Freq: Once | ORAL | Status: AC
  Administered 2016-10-12: 1 via ORAL

## 2016-10-12 MED ORDER — CALCIUM CARBONATE-VITAMIN D 500-200 MG-UNIT PO TABS
1.0000 | ORAL_TABLET | Freq: Two times a day (BID) | ORAL | Status: DC
  Administered 2016-10-12 – 2016-10-15 (×7): 1 via ORAL
  Filled 2016-10-12 (×8): qty 1

## 2016-10-12 MED ORDER — PHENOL 1.4 % MT LIQD: 1.0000 | OROMUCOSAL | Status: DC | PRN

## 2016-10-12 MED ORDER — ONDANSETRON HCL 4 MG PO TABS
4.0000 mg | ORAL_TABLET | Freq: Four times a day (QID) | ORAL | Status: DC | PRN
  Administered 2016-10-13: 4 mg via ORAL
  Filled 2016-10-12: qty 1

## 2016-10-12 MED ORDER — MENTHOL 3 MG MT LOZG: 1.0000 | LOZENGE | OROMUCOSAL | Status: DC | PRN

## 2016-10-12 MED ORDER — 0.9 % SODIUM CHLORIDE (POUR BTL) OPTIME
TOPICAL | Status: DC | PRN
  Administered 2016-10-12: 1000 mL

## 2016-10-12 MED ORDER — EPHEDRINE SULFATE 50 MG/ML IJ SOLN
INTRAMUSCULAR | Status: AC
  Filled 2016-10-12: qty 1

## 2016-10-12 MED ORDER — SODIUM CHLORIDE 0.9 % IV SOLN
INTRAVENOUS | Status: AC
  Administered 2016-10-12 (×2): via INTRAVENOUS

## 2016-10-12 MED ORDER — ACETAMINOPHEN 500 MG PO TABS
ORAL_TABLET | ORAL | Status: AC
  Filled 2016-10-12: qty 1

## 2016-10-12 MED ORDER — HYDROCODONE-ACETAMINOPHEN 5-325 MG PO TABS
ORAL_TABLET | ORAL | Status: AC
  Filled 2016-10-12: qty 1

## 2016-10-12 MED ORDER — METHOCARBAMOL 1000 MG/10ML IJ SOLN
500.0000 mg | Freq: Once | INTRAVENOUS | Status: AC
  Administered 2016-10-12: 500 mg via INTRAVENOUS
  Filled 2016-10-12: qty 550

## 2016-10-12 MED ORDER — HYDROCODONE-ACETAMINOPHEN 5-325 MG PO TABS
1.0000 | ORAL_TABLET | ORAL | Status: DC
  Administered 2016-10-12 – 2016-10-15 (×12): 1 via ORAL
  Filled 2016-10-12 (×15): qty 1

## 2016-10-12 MED ORDER — DOCUSATE SODIUM 100 MG PO CAPS
100.0000 mg | ORAL_CAPSULE | Freq: Two times a day (BID) | ORAL | Status: DC
  Administered 2016-10-12 – 2016-10-15 (×7): 100 mg via ORAL
  Filled 2016-10-12 (×7): qty 1

## 2016-10-12 MED ORDER — PROPOFOL 10 MG/ML IV BOLUS
INTRAVENOUS | Status: AC
  Filled 2016-10-12: qty 40

## 2016-10-12 MED ORDER — BUPIVACAINE LIPOSOME 1.3 % IJ SUSP
20.0000 mL | Freq: Once | INTRAMUSCULAR | Status: DC
  Filled 2016-10-12: qty 20

## 2016-10-12 MED ORDER — ASPIRIN EC 325 MG PO TBEC
325.0000 mg | DELAYED_RELEASE_TABLET | Freq: Every day | ORAL | Status: DC
  Administered 2016-10-13 – 2016-10-15 (×3): 325 mg via ORAL
  Filled 2016-10-12 (×4): qty 1

## 2016-10-12 MED ORDER — LACTATED RINGERS IV SOLN
INTRAVENOUS | Status: DC
  Administered 2016-10-12: 1000 mL via INTRAVENOUS
  Administered 2016-10-12: 08:00:00 via INTRAVENOUS

## 2016-10-12 MED ORDER — EPHEDRINE SULFATE 50 MG/ML IJ SOLN
INTRAMUSCULAR | Status: DC | PRN
  Administered 2016-10-12: 15 mg via INTRAVENOUS
  Administered 2016-10-12: 10 mg via INTRAVENOUS

## 2016-10-12 MED ORDER — VITAMIN D 1000 UNITS PO TABS
6000.0000 [IU] | ORAL_TABLET | Freq: Every day | ORAL | Status: DC
  Administered 2016-10-12 – 2016-10-15 (×4): 6000 [IU] via ORAL
  Filled 2016-10-12 (×4): qty 6

## 2016-10-12 MED ORDER — FENTANYL CITRATE (PF) 100 MCG/2ML IJ SOLN
INTRAMUSCULAR | Status: DC | PRN
  Administered 2016-10-12: 12.5 ug via INTRAVENOUS
  Administered 2016-10-12: 25 ug via INTRATHECAL

## 2016-10-12 MED ORDER — BUPIVACAINE-EPINEPHRINE (PF) 0.5% -1:200000 IJ SOLN
INTRAMUSCULAR | Status: AC
Start: 2016-10-12 — End: 2016-10-12
  Filled 2016-10-12: qty 30

## 2016-10-12 MED ORDER — BUPIVACAINE LIPOSOME 1.3 % IJ SUSP
INTRAMUSCULAR | Status: AC
  Filled 2016-10-12: qty 20

## 2016-10-12 MED ORDER — SODIUM CHLORIDE 0.9 % IJ SOLN
INTRAMUSCULAR | Status: AC
Start: 2016-10-12 — End: 2016-10-12
  Filled 2016-10-12: qty 10

## 2016-10-12 MED ORDER — BUPIVACAINE IN DEXTROSE 0.75-8.25 % IT SOLN
INTRATHECAL | Status: DC | PRN
  Administered 2016-10-12: 15 mg via INTRATHECAL

## 2016-10-12 MED ORDER — PROPOFOL 500 MG/50ML IV EMUL
INTRAVENOUS | Status: DC | PRN
  Administered 2016-10-12: 30 ug/kg/min via INTRAVENOUS

## 2016-10-12 MED ORDER — HYDROCHLOROTHIAZIDE 12.5 MG PO CAPS
12.5000 mg | ORAL_CAPSULE | Freq: Every day | ORAL | Status: DC
  Administered 2016-10-13 – 2016-10-15 (×3): 12.5 mg via ORAL
  Filled 2016-10-12 (×3): qty 1

## 2016-10-12 MED ORDER — METHOCARBAMOL 500 MG PO TABS
500.0000 mg | ORAL_TABLET | Freq: Four times a day (QID) | ORAL | Status: DC | PRN
  Administered 2016-10-12: 500 mg via ORAL
  Filled 2016-10-12: qty 1

## 2016-10-12 MED ORDER — SODIUM CHLORIDE 0.9 % IR SOLN
Status: DC | PRN
  Administered 2016-10-12: 3000 mL

## 2016-10-12 MED ORDER — ACETAMINOPHEN 325 MG PO TABS: 650.0000 mg | ORAL_TABLET | Freq: Four times a day (QID) | ORAL | Status: DC | PRN

## 2016-10-12 MED ORDER — ONDANSETRON HCL 4 MG/2ML IJ SOLN
4.0000 mg | Freq: Once | INTRAMUSCULAR | Status: AC
  Administered 2016-10-12: 4 mg via INTRAVENOUS

## 2016-10-12 MED ORDER — ADULT MULTIVITAMIN W/MINERALS CH
1.0000 | ORAL_TABLET | Freq: Every day | ORAL | Status: DC
  Administered 2016-10-12 – 2016-10-15 (×4): 1 via ORAL
  Filled 2016-10-12 (×4): qty 1

## 2016-10-12 MED ORDER — ONDANSETRON HCL 4 MG/2ML IJ SOLN
INTRAMUSCULAR | Status: AC
  Filled 2016-10-12: qty 2

## 2016-10-12 MED ORDER — METHOCARBAMOL 1000 MG/10ML IJ SOLN
500.0000 mg | Freq: Four times a day (QID) | INTRAVENOUS | Status: DC | PRN
  Filled 2016-10-12: qty 5

## 2016-10-12 SURGICAL SUPPLY — 70 items
BAG HAMPER (MISCELLANEOUS) ×3 IMPLANT
BANDAGE ELASTIC 4 VELCRO ST LF (GAUZE/BANDAGES/DRESSINGS) ×4 IMPLANT
BANDAGE ELASTIC 6 VELCRO ST LF (GAUZE/BANDAGES/DRESSINGS) ×2 IMPLANT
BANDAGE ESMARK 6X9 LF (GAUZE/BANDAGES/DRESSINGS) ×1 IMPLANT
BIT DRILL 3.2X128 (BIT) ×1 IMPLANT
BIT DRILL 3.2X128MM (BIT) ×1
BLADE HEX COATED 2.75 (ELECTRODE) ×3 IMPLANT
BLADE SAGITTAL 25.0X1.27X90 (BLADE) ×2 IMPLANT
BLADE SAGITTAL 25.0X1.27X90MM (BLADE) ×1
BNDG CMPR 9X6 STRL LF SNTH (GAUZE/BANDAGES/DRESSINGS) ×1
BNDG ESMARK 6X9 LF (GAUZE/BANDAGES/DRESSINGS) ×3
BOWL SMART MIX CTS (DISPOSABLE) ×2 IMPLANT
CAP KNEE TOTAL 3 SIGMA ×2 IMPLANT
CEMENT HV SMART SET (Cement) ×6 IMPLANT
CLOTH BEACON ORANGE TIMEOUT ST (SAFETY) ×3 IMPLANT
COOLER CRYO CUFF IC AND MOTOR (MISCELLANEOUS) ×3 IMPLANT
COVER LIGHT HANDLE STERIS (MISCELLANEOUS) ×6 IMPLANT
CUFF CRYO KNEE18X23 MED (MISCELLANEOUS) ×2 IMPLANT
CUFF TOURNIQUET SINGLE 34IN LL (TOURNIQUET CUFF) ×2 IMPLANT
DECANTER SPIKE VIAL GLASS SM (MISCELLANEOUS) ×3 IMPLANT
DRAPE BACK TABLE (DRAPES) ×3 IMPLANT
DRAPE EXTREMITY T 121X128X90 (DRAPE) ×3 IMPLANT
DRESSING AQUACEL AG ADV 3.5X12 (MISCELLANEOUS) ×1 IMPLANT
DRSG AQUACEL AG ADV 3.5X12 (MISCELLANEOUS) ×3
DRSG MEPILEX BORDER 4X12 (GAUZE/BANDAGES/DRESSINGS) ×3 IMPLANT
DURAPREP 26ML APPLICATOR (WOUND CARE) ×6 IMPLANT
ELECT REM PT RETURN 9FT ADLT (ELECTROSURGICAL) ×3
ELECTRODE REM PT RTRN 9FT ADLT (ELECTROSURGICAL) ×1 IMPLANT
EVACUATOR 3/16  PVC DRAIN (DRAIN) ×2
EVACUATOR 3/16 PVC DRAIN (DRAIN) ×1 IMPLANT
GLOVE BIO SURGEON STRL SZ7 (GLOVE) ×6 IMPLANT
GLOVE BIOGEL PI IND STRL 7.0 (GLOVE) ×2 IMPLANT
GLOVE BIOGEL PI INDICATOR 7.0 (GLOVE) ×6
GLOVE SKINSENSE NS SZ8.0 LF (GLOVE) ×4
GLOVE SKINSENSE STRL SZ8.0 LF (GLOVE) ×2 IMPLANT
GLOVE SS N UNI LF 8.5 STRL (GLOVE) ×3 IMPLANT
GOWN STRL REUS W/ TWL LRG LVL3 (GOWN DISPOSABLE) ×1 IMPLANT
GOWN STRL REUS W/TWL LRG LVL3 (GOWN DISPOSABLE) ×9 IMPLANT
GOWN STRL REUS W/TWL XL LVL3 (GOWN DISPOSABLE) ×3 IMPLANT
HANDPIECE INTERPULSE COAX TIP (DISPOSABLE) ×3
HOOD W/PEELAWAY (MISCELLANEOUS) ×12 IMPLANT
INST SET MAJOR BONE (KITS) ×3 IMPLANT
IV NS IRRIG 3000ML ARTHROMATIC (IV SOLUTION) ×3 IMPLANT
KIT BLADEGUARD II DBL (SET/KITS/TRAYS/PACK) ×3 IMPLANT
KIT ROOM TURNOVER APOR (KITS) ×3 IMPLANT
MANIFOLD NEPTUNE II (INSTRUMENTS) ×3 IMPLANT
MARKER SKIN DUAL TIP RULER LAB (MISCELLANEOUS) ×3 IMPLANT
NDL HYPO 21X1.5 SAFETY (NEEDLE) ×1 IMPLANT
NEEDLE HYPO 21X1.5 SAFETY (NEEDLE) ×3 IMPLANT
NS IRRIG 1000ML POUR BTL (IV SOLUTION) ×3 IMPLANT
PACK TOTAL JOINT (CUSTOM PROCEDURE TRAY) ×3 IMPLANT
PAD ARMBOARD 7.5X6 YLW CONV (MISCELLANEOUS) ×3 IMPLANT
PAD DANNIFLEX CPM (ORTHOPEDIC SUPPLIES) ×3 IMPLANT
SAW OSC TIP CART 19.5X105X1.3 (SAW) ×3 IMPLANT
SET BASIN LINEN APH (SET/KITS/TRAYS/PACK) ×3 IMPLANT
SET HNDPC FAN SPRY TIP SCT (DISPOSABLE) ×1 IMPLANT
STAPLER VISISTAT 35W (STAPLE) ×3 IMPLANT
SUT BRALON NAB BRD #1 30IN (SUTURE) ×6 IMPLANT
SUT MNCRL 0 VIOLET CTX 36 (SUTURE) ×1 IMPLANT
SUT MON AB 0 CT1 (SUTURE) ×3 IMPLANT
SUT MON AB 2-0 CT1 36 (SUTURE) IMPLANT
SUT MONOCRYL 0 CTX 36 (SUTURE) ×2
SYR 20CC LL (SYRINGE) ×6 IMPLANT
SYR 30ML LL (SYRINGE) ×3 IMPLANT
SYR BULB IRRIGATION 50ML (SYRINGE) ×5 IMPLANT
TOWEL OR 17X26 4PK STRL BLUE (TOWEL DISPOSABLE) ×3 IMPLANT
TOWER CARTRIDGE SMART MIX (DISPOSABLE) ×3 IMPLANT
TRAY FOLEY W/METER SILVER 16FR (SET/KITS/TRAYS/PACK) ×3 IMPLANT
WATER STERILE IRR 1000ML POUR (IV SOLUTION) ×6 IMPLANT
YANKAUER SUCT 12FT TUBE ARGYLE (SUCTIONS) ×3 IMPLANT

## 2016-10-12 NOTE — Anesthesia Postprocedure Evaluation (Signed)
Anesthesia Post Note  Patient: Mckenzie Leonard  Procedure(s) Performed: Procedure(s) (LRB): TOTAL KNEE ARTHROPLASTY (Left)  Patient location during evaluation: PACU Anesthesia Type: Spinal Level of consciousness: awake and patient cooperative Pain management: pain level controlled Vital Signs Assessment: post-procedure vital signs reviewed and stable Respiratory status: spontaneous breathing, nonlabored ventilation and respiratory function stable Cardiovascular status: blood pressure returned to baseline Postop Assessment: no signs of nausea or vomiting Anesthetic complications: no     Last Vitals:  Vitals:   10/12/16 0720 10/12/16 0725  BP: 134/79 134/79  Pulse:    Resp: 15 13  Temp:    SpO2: 96% 98%    Last Pain:  Vitals:   10/12/16 0635  TempSrc: Oral                 Jaythan Hinely J

## 2016-10-12 NOTE — Transfer of Care (Signed)
Immediate Anesthesia Transfer of Care Note  Patient: EMALINE KARNES  Procedure(s) Performed: Procedure(s): TOTAL KNEE ARTHROPLASTY (Left)  Patient Location: PACU  Anesthesia Type:Spinal  Level of Consciousness: awake and patient cooperative  Airway & Oxygen Therapy: Patient Spontanous Breathing and Patient connected to face mask oxygen  Post-op Assessment: Report given to RN and Post -op Vital signs reviewed and stable  Post vital signs: Reviewed and stable  Last Vitals:  Vitals:   10/12/16 0720 10/12/16 0725  BP: 134/79 134/79  Pulse:    Resp: 15 13  Temp:    SpO2: 96% 98%    Last Pain:  Vitals:   10/12/16 0635  TempSrc: Oral      Patients Stated Pain Goal: 6 (82/95/62 1308)  Complications: No apparent anesthesia complications

## 2016-10-12 NOTE — Progress Notes (Signed)
Ready for transport to room. Mckenzie Leonard refuses to take report at this time.

## 2016-10-12 NOTE — Anesthesia Procedure Notes (Signed)
Spinal  Patient location during procedure: OR Staffing Resident/CRNA: Ranisha Allaire, [place] Preanesthetic Checklist Completed: patient identified, site marked, surgical consent, pre-op evaluation, timeout performed, IV checked, risks and benefits discussed and monitors and equipment checked Spinal Block Patient position: left lateral decubitus Prep: Betadine Patient monitoring: heart rate, cardiac monitor, continuous pulse ox and blood pressure Approach: left paramedian Location: L2-3 Injection technique: single-shot Needle Needle type: Spinocan  Needle gauge: 22 G Needle length: 9 cm Assessment Sensory level: T4 Additional Notes CSF brisk and clear              [phone]        30 Sept 2020

## 2016-10-12 NOTE — Interval H&P Note (Signed)
History and Physical Interval Note:  10/12/2016 7:23 AM  BP 134/79   Pulse 73   Temp 98.1 F (36.7 C) (Oral)   Resp 15   Ht 5\' 2"  (1.575 m)   Wt 195 lb (88.5 kg)   SpO2 96%   BMI 35.67 kg/m  CBC Latest Ref Rng & Units 10/07/2016 12/31/2012 02/19/2012  WBC 4.0 - 10.5 K/uL 7.4 7.2 10.8(H)  Hemoglobin 12.0 - 15.0 g/dL 13.0 13.5 11.0(L)  Hematocrit 36.0 - 46.0 % 38.8 41.8 33.5(L)  Platelets 150 - 400 K/uL 283 - [place]  has presented today for surgery, with the diagnosis of osteoarthritis left knee  The various methods of treatment have been discussed with the patient and family. After consideration of risks, benefits and other options for treatment, the patient has consented to  Procedure(s): TOTAL KNEE ARTHROPLASTY (Left) as a surgical intervention .  The patient's history has been reviewed, patient examined, no change in status, stable for surgery.  I have reviewed the patient's chart and labs.  Questions were answered to the patient's satisfaction.     Arther Abbott

## 2016-10-12 NOTE — Anesthesia Preprocedure Evaluation (Signed)
Anesthesia Evaluation  Patient identified by MRN, date of birth, ID band Patient awake    Reviewed: Allergy & Precautions, NPO status , Patient's Chart, lab work & pertinent test results  History of Anesthesia Complications (+) PONV and history of anesthetic complications  Airway Mallampati: II  TM Distance: >3 FB Neck ROM: Full    Dental  (+) Partial Upper   Pulmonary neg pulmonary ROS,    breath sounds clear to auscultation       Cardiovascular hypertension, Pt. on medications  Rhythm:Regular Rate:Normal     Neuro/Psych negative neurological ROS     GI/Hepatic negative GI ROS, Neg liver ROS,   Endo/Other  diabetes, Type 2Hypothyroidism   Renal/GU negative Renal ROS     Musculoskeletal  (+) Arthritis ,   Abdominal   Peds  Hematology   Anesthesia Other Findings   Reproductive/Obstetrics                             Anesthesia Physical Anesthesia Plan  ASA: III  Anesthesia Plan: Spinal   Post-op Pain Management:    Induction:   PONV Risk Score and Plan:   Airway Management Planned: Simple Face Mask  Additional Equipment:   Intra-op Plan:   Post-operative Plan:   Informed Consent: I have reviewed the patients History and Physical, chart, labs and discussed the procedure including the risks, benefits and alternatives for the proposed anesthesia with the patient or authorized representative who has indicated his/her understanding and acceptance.     Plan Discussed with:   Anesthesia Plan Comments:         Anesthesia Quick Evaluation

## 2016-10-12 NOTE — Brief Op Note (Signed)
10/12/2016  9:38 AM  PATIENT:  Mckenzie Leonard  66 y.o. female  PRE-OPERATIVE DIAGNOSIS:  osteoarthritis left knee  POST-OPERATIVE DIAGNOSIS:  osteoarthritis left knee  PROCEDURE:  Procedure(s): TOTAL KNEE ARTHROPLASTY (Left)   3 femur  3 tibia  15 PS polyethylene insert  35 x 8.5 patella  Sigma fixed bearing posterior stabilized total knee  Operative findings severe varus knee severe wear medial compartment severe contractures medial and posterior medially of patellofemoral arthritis as well. Medial tibial plateau medial femoral condyle diffuse exposed bone  Assistants were Simonne Maffucci  Anesthetic spinal  SURGEON:  Surgeon(s) and Role:    Carole Civil, MD - Primary   EBL:  Total I/O In: 1600 [I.V.:1600] Out: 325 [Urine:300; Blood:25] One Hemovac drain  30 mL of Marcaine with epinephrine  exparel 20 mL diluted with 40 mL of saline  Counts were correct there were no specimens  Dictation by Viviann Spare TOURNIQUET:   Total Tourniquet Time Documented: Thigh (Left) - 83 minutes Total: Thigh (Left) - 83 minutes   PLAN OF CARE: Admit to inpatient   PATIENT DISPOSITION:  PACU - hemodynamically stable.   Delay start of Pharmacological VTE agent (>24hrs) due to surgical blood loss or risk of bleeding: no  [phone]

## 2016-10-12 NOTE — Op Note (Signed)
10/12/2016  9:38 AM  PATIENT:  Mckenzie Leonard  66 y.o. female  PRE-OPERATIVE DIAGNOSIS:  osteoarthritis left knee  POST-OPERATIVE DIAGNOSIS:  osteoarthritis left knee  PROCEDURE:  Procedure(s): TOTAL KNEE ARTHROPLASTY (Left)   3 femur  3 tibia  15 PS polyethylene insert  35 x 8.5 patella  Sigma fixed bearing posterior stabilized total knee  Operative findings severe varus knee severe wear medial compartment severe contractures medial and posterior medially of patellofemoral arthritis as well. Medial tibial plateau medial femoral condyle diffuse exposed bone  Assistants were Simonne Maffucci  Anesthetic spinal  SURGEON:  Surgeon(s) and Role:    Carole Civil, MD - Primary   EBL:  Total I/O In: 1600 [I.V.:1600] Out: 325 [Urine:300; Blood:25] One Hemovac drain  30 mL of Marcaine with epinephrine  exparel 20 mL diluted with 40 mL of saline  Counts were correct there were no specimens  Dictation by Dragon   Surgeon Dr. Aline Brochure [phone]  details of procedure:   The patient was identified in the preop holding area and the surgical site was confirmed as the left knee. Chart review and update were completed. The patient was taken to the operating room for spinal anesthesia. After successful spinal anesthesia Foley catheter was inserted. The patient was placed supine on the operating table. 2 g of Ancef were given IV prior to skin incision   the left leg was prepped with DuraPrep and draped sterilely. Timeout was completed. The limb was then exsanguinated a  6 inch Esmarch. The tourniquet was elevated to 300 mmHg.   A midline incision was made and taken down to the extensor mechanism followed by medial arthrotomy. The patella was everted. A synovectomy was performed as needed. The osteophytes were resected.  Anterior cruciate ligament and PCL and medial and lateral meniscus were resected.   a 3/8 inch drill bit was used to enter the femoral canal which was  suctioned and irrigated until the fluid was clear. The distal femoral cut was set for 11 millimeter resection with a 5   Left Valgus angle. This cut was completed and checked for flatness.   the femur was then measured to a size 3.  The cutting block was placed to match the epicondyles and the 4 distal cuts were made.   the tibia was subluxated forward and the external alignment guide was placed. We removed 8 mm of bone from the higher  lateral side. We set the guide for neutral varus valgus cut related to the  Mechanical axis of the tibia and for slope matching the patient's anatomy. Rotational alignment was set using the tibial tubercle, tibial spine and second metatarsal. The cutting block was pinned and the proximal tibia was resected.    spacer blocks were placed starting with a 10 mm insert to confirm equal flexion-extension gaps.   The size 10 insert did not fit and therefore an additional 2 mm bone resection was performed   A size  15  mm insert balanced the gaps.   We placed the femoral notch cutting guide size 3  and resected the notch.   Trial implants were placed using appropriate size femur , appropriate size tibial baseplate which was measured after the proximal tibia resection. Tibial rotation was set patella tracking was normal   The tibia was then punched per manufacture technique making sure to avoid internal rotation.   The patella measured a size 22   We resected down to a size 12 using a size 35  x 8.5 button.   Final range of motion check was performed with the appropriate size trials as mentioned above. Satisfactory reduction and motion were obtained.   Trial implants were removed. The bone was irrigated and dried and the cement was mixed on the back table  exparel was injected in the soft tissues and posterior capsule of the knee  These implants were then cemented in place. Excess cement was removed. The cement was allowed to cure. Second irrigation was performed.     FInal range of motion check and stability check was completed  The wound was irrigated third time Hemovac drain was placed, extensor mechanism was closed with #1 Nurolon followed by 0 Monocryl and staples to reapproximate the skin edges and subcutaneous tissue.   Sterile dressing was applied along with Ace bandages and Cryo/Cuff  The patient was taken recovery in stable condition to be placed in CPM   TOURNIQUET:   Total Tourniquet Time Documented: Thigh (Left) - 83 minutes Total: Thigh (Left) - 83 minutes   PLAN OF CARE: Admit to inpatient   PATIENT DISPOSITION:  PACU - hemodynamically stable.   Delay start of Pharmacological VTE agent (>24hrs) due to surgical blood loss or risk of bleeding: no  [phone]

## 2016-10-13 ENCOUNTER — Encounter (HOSPITAL_COMMUNITY): Payer: Self-pay | Admitting: Orthopedic Surgery

## 2016-10-13 ENCOUNTER — Other Ambulatory Visit: Payer: Self-pay | Admitting: *Deleted

## 2016-10-13 DIAGNOSIS — Z96652 Presence of left artificial knee joint: Secondary | ICD-10-CM

## 2016-10-13 LAB — GLUCOSE, CAPILLARY
Glucose-Capillary: 126 mg/dL — ABNORMAL HIGH (ref 65–99)
Glucose-Capillary: 140 mg/dL — ABNORMAL HIGH (ref 65–99)
Glucose-Capillary: 138 mg/dL — ABNORMAL HIGH (ref 65–99)
Glucose-Capillary: 138 mg/dL — ABNORMAL HIGH (ref 65–99)

## 2016-10-13 LAB — BASIC METABOLIC PANEL
Anion gap: 11 (ref 5–15)
BUN: 10 mg/dL (ref 6–20)
CHLORIDE: 101 mmol/L (ref 101–111)
CO2: 25 mmol/L (ref 22–32)
CREATININE: 0.63 mg/dL (ref 0.44–1.00)
Calcium: 8.2 mg/dL — ABNORMAL LOW (ref 8.9–10.3)
GFR calc Af Amer: >60 mL/min (ref >60)
GFR calc non Af Amer: >60 mL/min (ref >60)
Glucose, Bld: 150 mg/dL — ABNORMAL HIGH (ref 65–99)
Potassium: 3.5 mmol/L (ref 3.5–5.1)
Sodium: 137 mmol/L (ref 135–145)

## 2016-10-13 LAB — CBC
HEMATOCRIT: 37 % (ref 36.0–46.0)
HEMOGLOBIN: 12.2 g/dL (ref 12.0–15.0)
MCH: 29.5 pg (ref 26.0–34.0)
MCHC: 33 g/dL (ref 30.0–36.0)
MCV: 89.4 fL (ref 78.0–100.0)
Platelets: 243 10*3/uL (ref 150–400)
RBC: 4.14 MIL/uL (ref 3.87–5.11)
RDW: 14.5 % (ref 11.5–15.5)
WBC: 10.4 10*3/uL (ref 4.0–10.5)

## 2016-10-13 MED ORDER — ALBUTEROL SULFATE (2.5 MG/3ML) 0.083% IN NEBU: 2.5000 mg | INHALATION_SOLUTION | Freq: Four times a day (QID) | RESPIRATORY_TRACT | Status: DC | PRN

## 2016-10-13 MED ORDER — ALBUTEROL SULFATE (2.5 MG/3ML) 0.083% IN NEBU
2.5000 mg | INHALATION_SOLUTION | Freq: Four times a day (QID) | RESPIRATORY_TRACT | Status: DC
  Administered 2016-10-13: 2.5 mg via RESPIRATORY_TRACT
  Filled 2016-10-13: qty 3

## 2016-10-13 NOTE — Evaluation (Signed)
Physical Therapy Evaluation Patient Details Name: Mckenzie Leonard MRN: [id number] DOB: [birth year] Today's Date: 10/13/2016   History of Present Illness  L TKA  Clinical Impression  The patient was in severe pain earlier as per previous note. The patient has been medicated and tolerated  Exercises and short ambulation befor complaints of nausea. Patient  Noted to be diaphoretic. Assisted into recliner.  The patient palns DC home with family assistance. Pt admitted with above diagnosis. Pt currently with functional limitations due to the deficits listed below (see PT Problem List). Pt will benefit from skilled PT to increase their independence and safety with mobility to allow discharge to the venue listed below.  '     Follow Up Recommendations Home health PT;Supervision/Assistance - 24 hour    Equipment Recommendations  None recommended by PT    Recommendations for Other Services       Precautions / Restrictions Precautions Precautions: Fall;Knee      Mobility  Bed Mobility Overal bed mobility: Needs Assistance Bed Mobility: Supine to Sit     Supine to sit: Mod assist     General bed mobility comments: support of the left leg, assist with the  trunk, extra time to scoot  Transfers Overall transfer level: Needs assistance Equipment used: Rolling walker (2 wheeled) Transfers: Sit to/from Stand Sit to Stand: Mod assist;+2 safety/equipment         General transfer comment: assist to rise from bed, cues for hand and Left   Ambulation/Gait Ambulation/Gait assistance: Mod assist;+2 safety/equipment Ambulation Distance (Feet): 6 Feet Assistive device: Rolling walker (2 wheeled) Gait Pattern/deviations: Step-to pattern;Antalgic     General Gait Details: the patient had much difficulty with  advancing and bearing weight on the  left leg. Patient complained of incresed pain and nausea. Recliner pulled up.  BP126/59.  Stairs            Wheelchair Mobility     Modified Rankin (Stroke Patients Only)       Balance                                             Pertinent Vitals/Pain Pain Assessment: 0-10 Pain Score: 10-Worst pain ever Pain Location: left knee Pain Descriptors / Indicators: Aching;Burning;Crying;Discomfort;Grimacing;Guarding Pain Intervention(s): Premedicated before session;Repositioned;Patient requesting pain meds-RN notified;Monitored during session;Ice applied    Home Living Family/patient expects to be discharged to:: Private residence Living Arrangements: Children;Other relatives Available Help at Discharge: Family Type of Home: House Home Access: Stairs to enter Entrance Stairs-Rails: Psychiatric nurse of Steps: 2   Home Equipment: Environmental consultant - 2 wheels      Prior Function Level of Independence: Independent               Hand Dominance        Extremity/Trunk Assessment   Upper Extremity Assessment Upper Extremity Assessment: Defer to OT evaluation    Lower Extremity Assessment Lower Extremity Assessment: LLE deficits/detail LLE Deficits / Details: unable to perform SLR, knee flexion20-40    Cervical / Trunk Assessment Cervical / Trunk Assessment: Normal  Communication   Communication: No difficulties  Cognition Arousal/Alertness: Awake/alert Behavior During Therapy: WFL for tasks assessed/performed Overall Cognitive Status: Within Functional Limits for tasks assessed  General Comments      Exercises Total Joint Exercises Ankle Circles/Pumps: AROM;Both;10 reps;Supine Quad Sets: AROM;Both;Supine Heel Slides: AAROM;Left;10 reps;Supine Straight Leg Raises: AAROM;Left;10 reps;Supine   Assessment/Plan    PT Assessment Patient needs continued PT services  PT Problem List Decreased strength;Decreased range of motion;Decreased activity tolerance;Decreased mobility;Decreased knowledge of  precautions;Decreased safety awareness;Decreased knowledge of use of DME       PT Treatment Interventions DME instruction;Gait training;Stair training;Functional mobility training;Therapeutic activities;Therapeutic exercise    PT Goals (Current goals can be found in the Care Plan section)  Acute Rehab PT Goals Patient Stated Goal: to no have pain when walking PT Goal Formulation: With patient/family Time For Goal Achievement: 10/20/16 Potential to Achieve Goals: Good    Frequency 7X/week   Barriers to discharge        Co-evaluation               AM-PAC PT "6 Clicks" Daily Activity  Outcome Measure Difficulty turning over in bed (including adjusting bedclothes, sheets and blankets)?: Total Difficulty moving from lying on back to sitting on the side of the bed? : Total Difficulty sitting down on and standing up from a chair with arms (e.g., wheelchair, bedside commode, etc,.)?: Total Help needed moving to and from a bed to chair (including a wheelchair)?: Total Help needed walking in hospital room?: Total Help needed climbing 3-5 steps with a railing? : Total 6 Click Score: 6    End of Session Equipment Utilized During Treatment: Gait belt Activity Tolerance: Patient limited by pain;Treatment limited secondary to medical complications (Comment) Patient left: in chair;with family/visitor present;with call bell/phone within reach Nurse Communication: Mobility status PT Visit Diagnosis: Difficulty in walking, not elsewhere classified (R26.2)    Time: 1000-1043 PT Time Calculation (min) (ACUTE ONLY): 43 min   Charges:   PT Evaluation $PT Eval Low Complexity: 1 Low PT Treatments $Gait Training: 8-22 mins $Therapeutic Exercise: 8-22 mins   PT G CodesTresa Endo PT [phone]   [name] 10/13/2016, 1:07 PM

## 2016-10-13 NOTE — Progress Notes (Signed)
PT Cancellation Note  Patient Details Name: Mckenzie Leonard MRN: [id number] DOB: 1951-01-17   Cancelled Treatment:    Reason Eval/Treat Not Completed: Pain limiting ability to participate. The patient is moaning and reports severe left knee pain. Noted the left leg was externally rotated and flexed with CPM  Rolling. Removed the  CPM, repositioned the left leg to near neutral. Patient recently medicated. Will allow meds to take effect and return for evaluation.    [name] 10/13/2016, 9:21 AM [name] PT [phone]

## 2016-10-13 NOTE — Anesthesia Postprocedure Evaluation (Signed)
Anesthesia Post Note  Patient: Mckenzie Leonard  Procedure(s) Performed: Procedure(s) (LRB): TOTAL KNEE ARTHROPLASTY (Left)  Patient location during evaluation: Nursing Unit Anesthesia Type: Spinal Level of consciousness: awake and alert, oriented and patient cooperative Pain management: pain level controlled Vital Signs Assessment: post-procedure vital signs reviewed and stable Respiratory status: spontaneous breathing Cardiovascular status: stable : Nausea yesterday; better today. Anesthetic complications: no     Last Vitals:  Vitals:   10/12/16 1231 10/13/16 0500  BP: 128/72 121/62  Pulse: 88 78  Resp: 16 20  Temp: (!) 32.8 C 37.3 C  SpO2: 96% 98%    Last Pain:  Vitals:   10/13/16 0903  TempSrc:   PainSc: 10-Worst pain ever                 Darrin Apodaca A

## 2016-10-13 NOTE — Progress Notes (Signed)
**Note De-Identified Martrice Apt Obfuscation** Duoneb changed to q6PRN.  BBS cl, SAT 94% on 2 L [place]. Patient does not have significant respiratory history.  RRT to continue to monitor.

## 2016-10-13 NOTE — Care Management Note (Signed)
Case Management Note  Patient Details  Name: Mckenzie Leonard MRN: [id number] Date of Birth: [birth year]  Subjective/Objective:                  S/p TKR. Pt from home alone. Will have family with her 24/7 after DC. Pt has RW and BSC. Medical modalitites unable to proivde CMP machine. Pt has no preference of provider. Pt aware MD office has already made referral to [facility] for PT. Pt agreeable, aware HH will make first visit day after DC. Pt on oxygen acutely. Pt in a lot of pain during CM's visit today. Family at bedside.   Action/Plan: Plan for return home with [facility] through [facility]. [facility] rep aware of DC planned for tomorrow. CPM referral faxed to [name]. CM will cont to follow.   Expected Discharge Date:      10/14/2016            Expected Discharge Plan:  [place]  In-House Referral:  NA  Discharge planning Services  CM Consult  Post Acute Care Choice:  Home Health, Durable Medical Equipment Choice offered to:  Patient  DME Arranged:  Continuous passive motion machine DME Agency:  [place]  HH Arranged:  PT [place] Agency:  [facility] (formerly [facility])  Status of Service:  In process, will continue to follow  Sherald Barge, RN 10/13/2016, 12:46 PM

## 2016-10-13 NOTE — Progress Notes (Addendum)
Patient ID: Mckenzie Leonard, female   DOB: [birth year], 66 y.o.   MRN: [id number] POD 1 LEFT TKA   BP 121/62 (BP Location: Right Arm)   Pulse 78   Temp 99.1 F (37.3 C) (Oral)   Resp 20   Ht 5\' 2"  (1.575 m)   Wt 195 lb (88.5 kg)   SpO2 98%   BMI 35.67 kg/m   BMP Latest Ref Rng & Units 10/13/2016 10/07/2016 09/09/2016  Glucose 65 - 99 mg/dL 150(H) 89 107(H)  BUN 6 - 20 mg/dL 10 16 12   Creatinine 0.44 - 1.00 mg/dL 0.63 0.70 0.68  BUN/Creat Ratio 12 - 28 - - 18  Sodium 135 - 145 mmol/L 137 139 143  Potassium 3.5 - 5.1 mmol/L 3.5 3.6 4.1  Chloride 101 - 111 mmol/L 101 100(L) 99  CO2 22 - 32 mmol/L 25 28 24   Calcium 8.9 - 10.3 mg/dL 8.2(L) 9.7 9.2   CBC Latest Ref Rng & Units 10/13/2016 10/07/2016 12/31/2012  WBC 4.0 - 10.5 K/uL 10.4 7.4 7.2  Hemoglobin 12.0 - 15.0 g/dL 12.2 13.0 13.5  Hematocrit 36.0 - 46.0 % 37.0 38.8 41.8  Platelets 150 - 400 K/uL 243 283 -   Stable having some pain control issue but did not have any pain last night   Continue therapy

## 2016-10-13 NOTE — Progress Notes (Signed)
Physical Therapy Treatment Patient Details Name: Mckenzie Leonard MRN: [id number] DOB: [birth year] Today's Date: 10/13/2016    History of Present Illness L TKA    PT Comments    The patient had been medicated with nausea medication. Assisted back into bed and placed in CPM -7- 80. Patient was not tolerating 90 degrees. .  Continue PT.    Follow Up Recommendations  Home health PT;Supervision/Assistance - 24 hour     Equipment Recommendations  None recommended by PT    Recommendations for Other Services       Precautions / Restrictions Precautions Precautions: Fall;Knee Precaution Comments: dizzy on eval    Mobility  Bed Mobility Overal bed mobility: Needs Assistance Bed Mobility: Sit to Supine     Supine to sit: Mod assist Sit to supine: Mod assist   General bed mobility comments: support of the left leg,   Transfers Overall transfer level: Needs assistance Equipment used: Rolling walker (2 wheeled) Transfers: Sit to/from [employer] Sit to Stand: Mod assist;+2 safety/equipment         General transfer comment: assist to rise from recliner, cues for hand and Left leg support. Cues to back up to the bed, steady assist for balance, decreased weight bear on the left leg noted.  Ambulation/Gait Ambulation/Gait assistance: Mod assist;+2 safety/equipment Ambulation Distance (Feet): 6 Feet Assistive device: Rolling walker (2 wheeled) Gait Pattern/deviations: Step-to pattern;Antalgic     General Gait Details: unable this section   Stairs            Wheelchair Mobility    Modified Rankin (Stroke Patients Only)       Balance                                            Cognition Arousal/Alertness: Awake/alert Behavior During Therapy: WFL for tasks assessed/performed Overall Cognitive Status: Within Functional Limits for tasks assessed                                        Exercises Total Joint  Exercises Ankle Circles/Pumps: AROM;Both;10 reps;Supine Quad Sets: AROM;Both;Supine Heel Slides: AAROM;Left;10 reps;Supine Straight Leg Raises: AAROM;Left;10 reps;Supine    General Comments        Pertinent Vitals/Pain Pain Assessment: 0-10 Pain Score: 4  Pain Location: left knee Pain Descriptors / Indicators: Aching;Burning;Discomfort;Grimacing;Guarding Pain Intervention(s): Premedicated before session;Repositioned;Ice applied    Home Living Family/patient expects to be discharged to:: Private residence Living Arrangements: Children;Other relatives Available Help at Discharge: Family Type of Home: House Home Access: Stairs to enter Entrance Stairs-Rails: Right;Left   Home Equipment: Environmental consultant - 2 wheels      Prior Function Level of Independence: Independent          PT Goals (current goals can now be found in the care plan section) Acute Rehab PT Goals Patient Stated Goal: to no have pain when walking PT Goal Formulation: With patient/family Time For Goal Achievement: 10/20/16 Potential to Achieve Goals: Good Progress towards PT goals: Progressing toward goals    Frequency    7X/week      PT Plan Current plan remains appropriate    Co-evaluation              AM-PAC PT "6 Clicks" Daily Activity  Outcome Measure  Difficulty turning over in  bed (including adjusting bedclothes, sheets and blankets)?: Total Difficulty moving from lying on back to sitting on the side of the bed? : Total Difficulty sitting down on and standing up from a chair with arms (e.g., wheelchair, bedside commode, etc,.)?: Total Help needed moving to and from a bed to chair (including a wheelchair)?: Total Help needed walking in hospital room?: Total Help needed climbing 3-5 steps with a railing? : Total 6 Click Score: 6    End of Session Equipment Utilized During Treatment: Gait belt Activity Tolerance: Patient limited by pain Patient left: in bed;with call bell/phone within  reach;with family/visitor present;in CPM Nurse Communication: Mobility status PT Visit Diagnosis: Difficulty in walking, not elsewhere classified (R26.2)     Time: 9798-9211 PT Time Calculation (min) (ACUTE ONLY): 30 min  Charges:  $Gait Training: 8-22 mins $Therapeutic Exercise: 8-22 mins $Therapeutic Activity: 23-37 mins                    G CodesTresa Endo PT [phone]    [name] 10/13/2016, 1:27 PM

## 2016-10-13 NOTE — Addendum Note (Signed)
Addendum  created 10/13/16 0934 by Mickel Baas, CRNA   Sign clinical note

## 2016-10-14 ENCOUNTER — Ambulatory Visit: Payer: Self-pay | Admitting: Orthopedic Surgery

## 2016-10-14 LAB — GLUCOSE, CAPILLARY
GLUCOSE-CAPILLARY: 126 mg/dL — AB (ref 65–99)
GLUCOSE-CAPILLARY: 141 mg/dL — AB (ref 65–99)
Glucose-Capillary: 157 mg/dL — ABNORMAL HIGH (ref 65–99)
Glucose-Capillary: 108 mg/dL — ABNORMAL HIGH (ref 65–99)

## 2016-10-14 LAB — CBC
HEMATOCRIT: 38.5 % (ref 36.0–46.0)
HEMOGLOBIN: 12.9 g/dL (ref 12.0–15.0)
MCH: 29.8 pg (ref 26.0–34.0)
MCHC: 33.5 g/dL (ref 30.0–36.0)
MCV: 88.9 fL (ref 78.0–100.0)
Platelets: 267 10*3/uL (ref 150–400)
RBC: 4.33 MIL/uL (ref 3.87–5.11)
RDW: 14.7 % (ref 11.5–15.5)
WBC: 10.9 10*3/uL — ABNORMAL HIGH (ref 4.0–10.5)

## 2016-10-14 MED ORDER — SODIUM CHLORIDE 0.9 % IV SOLN: 250.0000 mL | INTRAVENOUS | Status: DC | PRN

## 2016-10-14 MED ORDER — SODIUM CHLORIDE 0.9% FLUSH
3.0000 mL | Freq: Two times a day (BID) | INTRAVENOUS | Status: DC
  Administered 2016-10-14 (×2): 3 mL via INTRAVENOUS

## 2016-10-14 MED ORDER — SODIUM CHLORIDE 0.9% FLUSH: 3.0000 mL | INTRAVENOUS | Status: DC | PRN

## 2016-10-14 NOTE — Evaluation (Signed)
Occupational Therapy Evaluation Patient Details Name: Mckenzie Leonard MRN: [id number] DOB: 09-Jul-1950 Today's Date: 10/14/2016    History of Present Illness L TKA   Clinical Impression   Pt received semi-reclined in bed, agreeable to OT evaluation. Pt reports no pain when still, during mobility pain increased to 4/10; pt received pain medication prior to session. Pt requiring increased assistance for ADL completion due to pain limiting mobility, family will be available 24/7 to assist as needed. During evaluation pt able to perform bed mobility and transfers with assistance, very hesitant to weight-bear on LLE, cuing required for hand placement and walker use. No further acute OT services required at this time.     Follow Up Recommendations  No OT follow up;Supervision/Assistance - 24 hour    Equipment Recommendations  None recommended by OT       Precautions / Restrictions Precautions Precautions: Fall;Knee Restrictions Weight Bearing Restrictions: No      Mobility Bed Mobility Overal bed mobility: Needs Assistance Bed Mobility: Supine to Sit     Supine to sit: Mod assist     General bed mobility comments: Assist for supporting the LLE, assist for scooting to EOB  Transfers Overall transfer level: Needs assistance Equipment used: Rolling walker (2 wheeled) Transfers: Sit to/from [employer] Transfers Sit to Stand: Min assist;From elevated surface Stand pivot transfers: Min assist       General transfer comment: Cueing to back to recliner, cuing for hand placement        ADL either performed or assessed with clinical judgement   ADL Overall ADL's : Needs assistance/impaired Eating/Feeding: Set up;Bed level   Grooming: Set up;Bed level               Lower Body Dressing: Total assistance;Sitting/lateral leans   Toilet Transfer: Moderate assistance   Toileting- Clothing Manipulation and Hygiene: Supervision/safety;Sitting/lateral lean          General ADL Comments: Pt requiring assistance for ADL completion due to pain.                   Pertinent Vitals/Pain Pain Assessment: Faces (during mobility; no pain at rest) Faces Pain Scale: Hurts little more Pain Location: left knee Pain Descriptors / Indicators: Aching;Burning;Grimacing;Guarding Pain Intervention(s): Limited activity within patient's tolerance;Monitored during session;Premedicated before session;Repositioned     Hand Dominance Right   Extremity/Trunk Assessment Upper Extremity Assessment Upper Extremity Assessment: Overall WFL for tasks assessed   Lower Extremity Assessment Lower Extremity Assessment: Defer to PT evaluation   Cervical / Trunk Assessment Cervical / Trunk Assessment: Normal   Communication Communication Communication: No difficulties   Cognition Arousal/Alertness: Awake/alert Behavior During Therapy: WFL for tasks assessed/performed Overall Cognitive Status: Within Functional Limits for tasks assessed                                                Home Living Family/patient expects to be discharged to:: Private residence Living Arrangements: Children;Other relatives Available Help at Discharge: Family Type of Home: House Home Access: Stairs to enter [employer] of Steps: 2 Entrance Stairs-Rails: Right;Left Home Layout: One level     Bathroom Shower/Tub: Teacher, early years/pre: Standard     Home Equipment: Environmental consultant - 2 wheels          Prior Functioning/Environment Level of Independence: Independent  OT Problem List: Decreased activity tolerance;Pain       End of Session Equipment Utilized During Treatment: Gait belt;Rolling walker  Activity Tolerance: Patient limited by pain Patient left: in chair;with call bell/phone within reach  OT Visit Diagnosis: Pain Pain - Right/Left: Left Pain - part of body: Knee                Time: 0902-0928 OT Time  Calculation (min): 26 min Charges:  OT General Charges $OT Visit: 1 Procedure OT Evaluation $OT Eval Low Complexity: 1 Procedure    Guadelupe Sabin, OTR/L  [phone] 10/14/2016, 9:43 AM

## 2016-10-14 NOTE — Care Management Note (Signed)
Case Management Note  Patient Details  Name: Mckenzie Leonard MRN: [id number] Date of Birth: 1951-02-21   Expected Discharge Date:       10/15/2016          Expected Discharge Plan:  [place]  In-House Referral:  NA  Discharge planning Services  CM Consult  Post Acute Care Choice:  Home Health, Durable Medical Equipment Choice offered to:  Patient  DME Arranged:  Continuous passive motion machine DME Agency:  [place]  HH Arranged:  PT [place] Agency:  [facility] ([facility]  Status of Service:  Completed, signed off  Additional Comments: Discharge planned for tomorrow. [facility] rep aware and visit planned for Sunday. RN does not need to notify [facility] rep of DC they will monitor for DC. [name] aware of DC tomorrow. Pt given number to [name] instructed to call them once she leaves the hospital ([phone]).  Sherald Barge, RN 10/14/2016, 12:11 PM

## 2016-10-14 NOTE — Progress Notes (Signed)
Patient ID: Mckenzie Leonard, female   DOB: Aug 14, 1950, 66 y.o.   MRN: [id number] POD 2 LEFT TKA  BMP Latest Ref Rng & Units 10/13/2016 10/07/2016 09/09/2016  Glucose 65 - 99 mg/dL 150(H) 89 107(H)  BUN 6 - 20 mg/dL 10 16 12   Creatinine 0.44 - 1.00 mg/dL 0.63 0.70 0.68  BUN/Creat Ratio 12 - 28 - - 18  Sodium 135 - 145 mmol/L 137 139 143  Potassium 3.5 - 5.1 mmol/L 3.5 3.6 4.1  Chloride 101 - 111 mmol/L 101 100(L) 99  CO2 22 - 32 mmol/L 25 28 24   Calcium 8.9 - 10.3 mg/dL 8.2(L) 9.7 9.2   CBC Latest Ref Rng & Units 10/13/2016 10/07/2016 12/31/2012  WBC 4.0 - 10.5 K/uL 10.4 7.4 7.2  Hemoglobin 12.0 - 15.0 g/dL 12.2 13.0 13.5  Hematocrit 36.0 - 46.0 % 37.0 38.8 41.8  Platelets 150 - 400 K/uL 243 283 -    PAIN CONTROL IS BETTER   URINARY RETENTION WILL ORDER IN/OUT CATH  STAY IN HOUSE FOR MORE PT   DC TOMORROW

## 2016-10-14 NOTE — Progress Notes (Signed)
Physical Therapy Treatment Patient Details Name: Mckenzie Leonard MRN: [id number] DOB: July 29, 1950 Today's Date: 10/14/2016    History of Present Illness L TKA    PT Comments    Patient is progressing well. Plans Dc tomorrow. .    Follow Up Recommendations  Home health PT;Supervision/Assistance - 24 hour     Equipment Recommendations  None recommended by PT    Recommendations for Other Services       Precautions / Restrictions Precautions Precautions: Fall;Knee    Mobility  Bed Mobility   Bed Mobility: Sit to Supine       Sit to supine: Min assist   General bed mobility comments: assist for the  right leg  Transfers Overall transfer level: Needs assistance Equipment used: Rolling walker (2 wheeled) Transfers: Sit to/from Stand Sit to Stand: Min guard         General transfer comment: Cueing tofor the  right leg position  Ambulation/Gait Ambulation/Gait assistance: Min assist Ambulation Distance (Feet): 60 Feet Assistive device: Rolling walker (2 wheeled) Gait Pattern/deviations: Step-to pattern     General Gait Details: cues to toll the  walker.    Stairs            Wheelchair Mobility    Modified Rankin (Stroke Patients Only)       Balance                                            Cognition Arousal/Alertness: Awake/alert                                            Exercises Total Joint Exercises Ankle Circles/Pumps: AROM;Both;10 reps;Supine Quad Sets: AROM;Both;Supine Short Arc QuadSinclair Ship;Right;10 reps Heel Slides: AAROM;Left;10 reps;Supine Hip ABduction/ADduction: AROM;Right;10 reps Straight Leg Raises: AAROM;Left;10 reps;Supine Goniometric ROM: 10-45 right  knee    General Comments        Pertinent Vitals/Pain Pain Score: 2  Pain Location: left knee Pain Descriptors / Indicators: Sore;Discomfort Pain Intervention(s): Monitored during session;RN gave pain meds during session;Ice  applied;Repositioned    Home Living                      Prior Function            PT Goals (current goals can now be found in the care plan section) Progress towards PT goals: Progressing toward goals    Frequency    7X/week      PT Plan Current plan remains appropriate    Co-evaluation              AM-PAC PT "6 Clicks" Daily Activity  Outcome Measure  Difficulty turning over in bed (including adjusting bedclothes, sheets and blankets)?: A Little Difficulty moving from lying on back to sitting on the side of the bed? : A Little Difficulty sitting down on and standing up from a chair with arms (e.g., wheelchair, bedside commode, etc,.)?: A Lot Help needed moving to and from a bed to chair (including a wheelchair)?: A Lot Help needed walking in hospital room?: A Lot Help needed climbing 3-5 steps with a railing? : Total 6 Click Score: 13    End of Session   Activity Tolerance: Patient tolerated treatment well Patient left: in bed;with call  bell/phone within reach;with family/visitor present;in CPM   PT Visit Diagnosis: Difficulty in walking, not elsewhere classified (R26.2)     Time: 3128-1188 PT Time Calculation (min) (ACUTE ONLY): 48 min  Charges:  $Gait Training: 8-22 mins $Therapeutic Exercise: 8-22 mins $Self Care/Home Management: 8-22                    G CodesTresa Endo PT [phone]   [name] 10/14/2016, 5:10 PM

## 2016-10-14 NOTE — Clinical Social Work Note (Signed)
Patient recommended for HHPT and is going home.   LCSW signing off.       Demorris Choyce, Clydene Pugh, LCSW

## 2016-10-14 NOTE — Care Management Important Message (Signed)
Important Message  Patient Details  Name: Mckenzie Leonard MRN: [id number] Date of Birth: 13-Nov-1950   Medicare Important Message Given:  Yes    Sherald Barge, RN 10/14/2016, 12:11 PM

## 2016-10-15 LAB — CBC
HEMATOCRIT: 37.9 % (ref 36.0–46.0)
HEMOGLOBIN: 12.6 g/dL (ref 12.0–15.0)
MCH: 29.9 pg (ref 26.0–34.0)
MCHC: 33.2 g/dL (ref 30.0–36.0)
MCV: 89.8 fL (ref 78.0–100.0)
Platelets: 268 10*3/uL (ref 150–400)
RBC: 4.22 MIL/uL (ref 3.87–5.11)
RDW: 15 % (ref 11.5–15.5)
WBC: 10.1 10*3/uL (ref 4.0–10.5)

## 2016-10-15 LAB — GLUCOSE, CAPILLARY: GLUCOSE-CAPILLARY: 136 mg/dL — AB (ref 65–99)

## 2016-10-15 MED ORDER — IBUPROFEN 800 MG PO TABS
800.0000 mg | ORAL_TABLET | Freq: Three times a day (TID) | ORAL | 1 refills | Status: DC | PRN
Start: 2016-10-15 — End: 2016-11-11

## 2016-10-15 MED ORDER — GABAPENTIN 100 MG PO CAPS: 100.0000 mg | ORAL_CAPSULE | Freq: Three times a day (TID) | ORAL | 2 refills | Status: DC

## 2016-10-15 MED ORDER — ASPIRIN 325 MG PO TBEC: 325.0000 mg | DELAYED_RELEASE_TABLET | Freq: Every day | ORAL | 0 refills | Status: DC

## 2016-10-15 MED ORDER — HYDROCODONE-ACETAMINOPHEN 7.5-325 MG PO TABS: 1.0000 | ORAL_TABLET | Freq: Three times a day (TID) | ORAL | 0 refills | Status: DC | PRN

## 2016-10-15 MED ORDER — DOCUSATE SODIUM 100 MG PO CAPS: 100.0000 mg | ORAL_CAPSULE | Freq: Two times a day (BID) | ORAL | 0 refills | Status: DC

## 2016-10-15 MED ORDER — METHOCARBAMOL 500 MG PO TABS: 500.0000 mg | ORAL_TABLET | Freq: Four times a day (QID) | ORAL | 2 refills | Status: DC | PRN

## 2016-10-15 NOTE — Progress Notes (Signed)
Physical Therapy Treatment Patient Details Name: Mckenzie Leonard MRN: [id number] DOB: 04-11-1950 Today's Date: 10/15/2016    History of Present Illness L TKA    PT Comments    Pt received in bed and was agreeable to PT treatment. She denied any pain this date. Bed level exercises performed at beginning of session which pt tolerated well.  Pt able to ambulate 125ft with RW with supervision this date with step-to pattern and min cues for proper RW usage. Pt's AROM 10-55 deg. Continue to recommend HHPT and 24-hour supervision upon d/c.    Follow Up Recommendations  Home health PT;Supervision/Assistance - 24 hour     Equipment Recommendations  None recommended by PT    Recommendations for Other Services       Precautions / Restrictions Precautions Precautions: Fall;Knee    Mobility  Bed Mobility Overal bed mobility: Needs Assistance Bed Mobility: Supine to Sit;Sit to Supine     Supine to sit: Supervision Sit to supine: Min assist   General bed mobility comments: min A for LLE sit > supine  Transfers Overall transfer level: Needs assistance Equipment used: Rolling walker (2 wheeled) Transfers: Sit to/from Stand Sit to Stand: Supervision         General transfer comment: maintained LLE extended during transfer  Ambulation/Gait Ambulation/Gait assistance: Supervision Ambulation Distance (Feet): 100 Feet Assistive device: Rolling walker (2 wheeled) Gait Pattern/deviations: Step-to pattern   Gait velocity interpretation: <1.8 ft/sec, indicative of risk for recurrent falls General Gait Details: min cues to roll walker and not pick up and place it   Stairs            Wheelchair Mobility    Modified Rankin (Stroke Patients Only)       Balance                                            Cognition Arousal/Alertness: Awake/alert                                            Exercises Total Joint Exercises Quad Sets:  Left;10 reps;Supine;Limitations Quad Sets Limitations: 3 sec holds Short Arc Quad: AAROM;Left;10 reps;Supine Heel Slides: AAROM;Left;10 reps;Supine Straight Leg Raises: AAROM;Left;10 reps;Supine Goniometric ROM: 10-55 L knee    General Comments        Pertinent Vitals/Pain Pain Assessment: No/denies pain    Home Living                      Prior Function            PT Goals (current goals can now be found in the care plan section) Acute Rehab PT Goals Patient Stated Goal: to no have pain when walking PT Goal Formulation: With patient/family Time For Goal Achievement: 10/20/16 Potential to Achieve Goals: Good    Frequency    7X/week      PT Plan      Co-evaluation              AM-PAC PT "6 Clicks" Daily Activity  Outcome Measure  Difficulty turning over in bed (including adjusting bedclothes, sheets and blankets)?: None Difficulty moving from lying on back to sitting on the side of the bed? : A Little Difficulty sitting down on and standing  up from a chair with arms (e.g., wheelchair, bedside commode, etc,.)?: A Little Help needed moving to and from a bed to chair (including a wheelchair)?: A Little Help needed walking in hospital room?: A Little Help needed climbing 3-5 steps with a railing? : A Lot 6 Click Score: 18    End of Session Equipment Utilized During Treatment: Gait belt Activity Tolerance: Patient tolerated treatment well;No increased pain Patient left: in bed;with call bell/phone within reach;with family/visitor present;in CPM Nurse Communication: Mobility status PT Visit Diagnosis: Difficulty in walking, not elsewhere classified (R26.2)     Time: 1010-1026 PT Time Calculation (min) (ACUTE ONLY): 16 min  Charges:  $Therapeutic Activity: 8-22 mins                    G Codes:        Geraldine Solar PT, DPT

## 2016-10-15 NOTE — Progress Notes (Signed)
Discharge instructions and prescription given, verbalized understanding, out in stable condition via w/c with staff. 

## 2016-10-15 NOTE — Discharge Summary (Signed)
Discharge summary  Physician Discharge Summary  Patient ID: Mckenzie Leonard MRN: [id number] DOB/AGE: [birth year] 66 y.o.  Admit date: 10/12/2016 Discharge date: 10/15/2016  Admission Diagnoses: Osteoarthritis left knee  Discharge Diagnoses: Osteoarthritis left knee Active Problems:   Primary osteoarthritis of left knee   Discharged Condition: stable  Hospital Course:  Hospital day 1 August 15 patient underwent uncomplicated left total knee, Sigma fixed bearing posterior stabilized knee replacement done under spinal or no complications  Hospital day 2 August 16  postoperative total knee protocol was started patient tolerated walking and in bed exercises well. She had difficulty with long-term use of her continuous passive motion machine and it was changed to 4 hours per day and was discontinued at night  Hospital day 3 August 17 patient progressed to walking proximal 60 feet with a walker full weightbearing  ROM 10-45  Hospital day for August 18 patient was afebrile vital signs are stable wound was clean dry and intact neurovascular exam was normal she was cleared for discharge  CBC Latest Ref Rng & Units 10/15/2016 10/14/2016 10/13/2016  WBC 4.0 - 10.5 K/uL 10.1 10.9(H) 10.4  Hemoglobin 12.0 - 15.0 g/dL 12.6 12.9 12.2  Hematocrit 36.0 - 46.0 % 37.9 38.5 37.0  Platelets 150 - 400 K/uL 268 267 243   BMP Latest Ref Rng & Units 10/13/2016 10/07/2016 09/09/2016  Glucose 65 - 99 mg/dL 150(H) 89 107(H)  BUN 6 - 20 mg/dL '10 16 12  '$ Creatinine 0.44 - 1.00 mg/dL 0.63 0.70 0.68  BUN/Creat Ratio 12 - 28 - - 18  Sodium 135 - 145 mmol/L 137 139 143  Potassium 3.5 - 5.1 mmol/L 3.5 3.6 4.1  Chloride 101 - 111 mmol/L 101 100(L) 99  CO2 22 - 32 mmol/L '25 28 24  '$ Calcium 8.9 - 10.3 mg/dL 8.2(L) 9.7 9.2        Discharge Exam: Blood pressure 113/67, pulse 77, temperature 98.4 F (36.9 C), resp. rate 14, height '5\' 2"'$  (1.575 m), weight 195 lb (88.5 kg), SpO2 92 %.   Disposition: 01-Home or Self  Care  Discharge Instructions    CPM    Complete by:  As directed    Continuous passive motion machine (CPM):      2 weeks 4 hrs per day  Start 0-50 increase 10 per day   Call MD / Call 911    Complete by:  As directed    If you experience chest pain or shortness of breath, CALL 911 and be transported to the hospital emergency room.  If you develope a fever above 101 F, pus (white drainage) or increased drainage or redness at the wound, or calf pain, call your surgeon's office.   Change dressing    Complete by:  As directed    Change dressing only if it becomes soiled   Constipation Prevention    Complete by:  As directed    Drink plenty of fluids.  Prune juice may be helpful.  You may use a stool softener, such as Colace (over the counter) 100 mg twice a day.  Use MiraLax (over the counter) for constipation as needed.   Diet - low sodium heart healthy    Complete by:  As directed    Do not put a pillow under the knee. Place it under the heel.    Complete by:  As directed    Increase activity slowly as tolerated    Complete by:  As directed    TED hose  Complete by:  As directed    Use stockings (TED hose) for 2 weeks     Allergies as of 10/15/2016      Reactions   Sulfa Antibiotics Anaphylaxis   Statins Other (See Comments)   One of them caused muscle pain   Tetanus Toxoids    Reports flu like symptoms and swelling at injection site only      Medication List    STOP taking these medications   naproxen sodium 220 MG tablet Commonly known as:  ANAPROX     TAKE these medications   aspirin 325 MG EC tablet Take 1 tablet (325 mg total) by mouth daily with breakfast.   atorvastatin 40 MG tablet Commonly known as:  LIPITOR Take 1 tablet (40 mg total) by mouth daily. What changed:  when to take this   blood glucose meter kit and supplies Kit Dispense based on patient and insurance preference. Use to check BG once daily.  ICD 10 code = E11.9   calcium-vitamin D  500-200 MG-UNIT tablet Commonly known as:  OSCAL WITH D Take 1 tablet by mouth 2 (two) times daily.   docusate sodium 100 MG capsule Commonly known as:  COLACE Take 1 capsule (100 mg total) by mouth 2 (two) times daily.   gabapentin 100 MG capsule Commonly known as:  NEURONTIN Take 1 capsule (100 mg total) by mouth 3 (three) times daily.   HYDROcodone-acetaminophen 7.5-325 MG tablet Commonly known as:  NORCO Take 1 tablet by mouth every 8 (eight) hours as needed for moderate pain.   ibuprofen 800 MG tablet Commonly known as:  ADVIL,MOTRIN Take 1 tablet (800 mg total) by mouth every 8 (eight) hours as needed.   levothyroxine 50 MCG tablet Commonly known as:  SYNTHROID, LEVOTHROID Take 1 tablet (50 mcg total) by mouth daily.   magnesium gluconate 500 MG tablet Commonly known as:  MAGONATE Take 500 mg by mouth daily.   methocarbamol 500 MG tablet Commonly known as:  ROBAXIN Take 1 tablet (500 mg total) by mouth every 6 (six) hours as needed for muscle spasms.   multivitamin with minerals Tabs tablet Take 1 tablet by mouth daily.   olmesartan-hydrochlorothiazide 40-12.5 MG tablet Commonly known as:  BENICAR HCT Take 1 tablet by mouth daily.   ONE TOUCH ULTRA TEST test strip Generic drug:  glucose blood USE TO check sugar ONCE daily OR PER directions   TURMERIC PO Take 2 tablets by mouth 2 (two) times daily.   Vitamin D 2000 units Caps Take 6,000 Units by mouth daily.            Durable Medical Equipment        Start     Ordered   10/12/16 1135  DME Walker rolling  Once    Question:  Patient needs a walker to treat with the following condition  Answer:  History of total left knee replacement   10/12/16 1135   10/12/16 1135  DME 3 n 1  Once     10/12/16 1135   10/12/16 1135  DME Bedside commode  Once    Question:  Patient needs a bedside commode to treat with the following condition  Answer:  History of left knee replacement   10/12/16 1135   10/12/16 0947   For home use only DME Walker rolling  Once    Question:  Patient needs a walker to treat with the following condition  Answer:  Total knee replacement status, unspecified laterality   10/12/16  4332   10/12/16 0947  For home use only DME 3 n 1  Once     10/12/16 9518     Follow-up Information    [name], FNP Follow up on 11/22/2016.   Specialty:  Family Medicine Why:  at 9:15 am Contact information: [place] [address] [phone]        [facility] Follow up.   Specialty:  [facility] Contact information: [place] [address] [phone]        Carole Civil, MD Follow up.   Specialties:  Orthopedic Surgery, Radiology Contact information: [address] [place] [address] [phone]           Signed: Arther Abbott 10/15/2016, 9:44 AM

## 2016-10-15 NOTE — Discharge Instructions (Signed)
cpm 0-50 increase 10 per day as tolerated use for 2 weeks 4 hours per day   Do not change dressing unless it becomes soiled

## 2016-10-16 DIAGNOSIS — Z471 Aftercare following joint replacement surgery: Secondary | ICD-10-CM | POA: Diagnosis not present

## 2016-10-16 DIAGNOSIS — M8580 Other specified disorders of bone density and structure, unspecified site: Secondary | ICD-10-CM | POA: Diagnosis not present

## 2016-10-16 DIAGNOSIS — E039 Hypothyroidism, unspecified: Secondary | ICD-10-CM | POA: Diagnosis not present

## 2016-10-16 DIAGNOSIS — E785 Hyperlipidemia, unspecified: Secondary | ICD-10-CM | POA: Diagnosis not present

## 2016-10-16 DIAGNOSIS — E119 Type 2 diabetes mellitus without complications: Secondary | ICD-10-CM | POA: Diagnosis not present

## 2016-10-16 DIAGNOSIS — Z96652 Presence of left artificial knee joint: Secondary | ICD-10-CM | POA: Diagnosis not present

## 2016-10-16 DIAGNOSIS — I10 Essential (primary) hypertension: Secondary | ICD-10-CM | POA: Diagnosis not present

## 2016-10-16 DIAGNOSIS — Z7982 Long term (current) use of aspirin: Secondary | ICD-10-CM | POA: Diagnosis not present

## 2016-10-16 DIAGNOSIS — E559 Vitamin D deficiency, unspecified: Secondary | ICD-10-CM | POA: Diagnosis not present

## 2016-10-16 DIAGNOSIS — Z9181 History of falling: Secondary | ICD-10-CM | POA: Diagnosis not present

## 2016-10-17 LAB — BPAM RBC
BLOOD PRODUCT EXPIRATION DATE: 201808292359
Blood Product Expiration Date: 201808292359
Blood Product Expiration Date: 201808292359
ISSUE DATE / TIME: 201808200928
ISSUE DATE / TIME: 201808201129
Unit Type and Rh: 6200
Unit Type and Rh: 6200
Unit Type and Rh: 6200

## 2016-10-17 LAB — TYPE AND SCREEN
ABO/RH(D): A POS
ANTIBODY SCREEN: NEGATIVE
UNIT DIVISION: 0
UNIT DIVISION: 0
Unit division: 0

## 2016-10-19 DIAGNOSIS — I10 Essential (primary) hypertension: Secondary | ICD-10-CM | POA: Diagnosis not present

## 2016-10-19 DIAGNOSIS — E119 Type 2 diabetes mellitus without complications: Secondary | ICD-10-CM | POA: Diagnosis not present

## 2016-10-19 DIAGNOSIS — M8580 Other specified disorders of bone density and structure, unspecified site: Secondary | ICD-10-CM | POA: Diagnosis not present

## 2016-10-19 DIAGNOSIS — Z96652 Presence of left artificial knee joint: Secondary | ICD-10-CM | POA: Diagnosis not present

## 2016-10-19 DIAGNOSIS — Z471 Aftercare following joint replacement surgery: Secondary | ICD-10-CM | POA: Diagnosis not present

## 2016-10-19 DIAGNOSIS — E039 Hypothyroidism, unspecified: Secondary | ICD-10-CM | POA: Diagnosis not present

## 2016-10-19 DIAGNOSIS — Z9181 History of falling: Secondary | ICD-10-CM | POA: Diagnosis not present

## 2016-10-19 DIAGNOSIS — Z7982 Long term (current) use of aspirin: Secondary | ICD-10-CM | POA: Diagnosis not present

## 2016-10-19 DIAGNOSIS — E559 Vitamin D deficiency, unspecified: Secondary | ICD-10-CM | POA: Diagnosis not present

## 2016-10-19 DIAGNOSIS — E785 Hyperlipidemia, unspecified: Secondary | ICD-10-CM | POA: Diagnosis not present

## 2016-10-21 DIAGNOSIS — E785 Hyperlipidemia, unspecified: Secondary | ICD-10-CM | POA: Diagnosis not present

## 2016-10-21 DIAGNOSIS — I10 Essential (primary) hypertension: Secondary | ICD-10-CM | POA: Diagnosis not present

## 2016-10-21 DIAGNOSIS — Z471 Aftercare following joint replacement surgery: Secondary | ICD-10-CM | POA: Diagnosis not present

## 2016-10-21 DIAGNOSIS — Z7982 Long term (current) use of aspirin: Secondary | ICD-10-CM | POA: Diagnosis not present

## 2016-10-21 DIAGNOSIS — E039 Hypothyroidism, unspecified: Secondary | ICD-10-CM | POA: Diagnosis not present

## 2016-10-21 DIAGNOSIS — M8580 Other specified disorders of bone density and structure, unspecified site: Secondary | ICD-10-CM | POA: Diagnosis not present

## 2016-10-21 DIAGNOSIS — E119 Type 2 diabetes mellitus without complications: Secondary | ICD-10-CM | POA: Diagnosis not present

## 2016-10-21 DIAGNOSIS — Z9181 History of falling: Secondary | ICD-10-CM | POA: Diagnosis not present

## 2016-10-21 DIAGNOSIS — Z96652 Presence of left artificial knee joint: Secondary | ICD-10-CM | POA: Diagnosis not present

## 2016-10-21 DIAGNOSIS — E559 Vitamin D deficiency, unspecified: Secondary | ICD-10-CM | POA: Diagnosis not present

## 2016-10-24 ENCOUNTER — Ambulatory Visit (INDEPENDENT_AMBULATORY_CARE_PROVIDER_SITE_OTHER): Payer: Medicare Other | Admitting: Orthopedic Surgery

## 2016-10-24 DIAGNOSIS — Z96652 Presence of left artificial knee joint: Secondary | ICD-10-CM

## 2016-10-24 DIAGNOSIS — Z4889 Encounter for other specified surgical aftercare: Secondary | ICD-10-CM

## 2016-10-24 MED ORDER — HYDROCODONE-ACETAMINOPHEN 5-325 MG PO TABS: 1.0000 | ORAL_TABLET | Freq: Four times a day (QID) | ORAL | 0 refills | Status: DC | PRN

## 2016-10-24 NOTE — Progress Notes (Signed)
FOLLOW UP FOR  Left  TKA  No chief complaint on file.   The patient is doing very well. The pain is controlled with norco 7.5  The surgical site is clean dry and intact  We were able to remove the staples today The calf is supple nontender the Homans sign is normal  DVT prevention aspirin 325 mg twice a day with bilateral TED hose  CPM machine at home, stop   physical therapy [facility] start Monday   ROM <% - 90    Assessment and plan: Stable, continue physical therapy return 2-4 weeks Start physical therapy as outpatient when home therapy completed

## 2016-10-24 NOTE — Patient Instructions (Signed)
Call [facility] PT for appt next Monday

## 2016-10-24 NOTE — Addendum Note (Signed)
Addended by: Baldomero Lamy B on: 10/24/2016 03:04 PM   Modules accepted: Orders

## 2016-10-25 DIAGNOSIS — E785 Hyperlipidemia, unspecified: Secondary | ICD-10-CM | POA: Diagnosis not present

## 2016-10-25 DIAGNOSIS — Z7982 Long term (current) use of aspirin: Secondary | ICD-10-CM | POA: Diagnosis not present

## 2016-10-25 DIAGNOSIS — Z96652 Presence of left artificial knee joint: Secondary | ICD-10-CM | POA: Diagnosis not present

## 2016-10-25 DIAGNOSIS — Z471 Aftercare following joint replacement surgery: Secondary | ICD-10-CM | POA: Diagnosis not present

## 2016-10-25 DIAGNOSIS — E119 Type 2 diabetes mellitus without complications: Secondary | ICD-10-CM | POA: Diagnosis not present

## 2016-10-25 DIAGNOSIS — E559 Vitamin D deficiency, unspecified: Secondary | ICD-10-CM | POA: Diagnosis not present

## 2016-10-25 DIAGNOSIS — I10 Essential (primary) hypertension: Secondary | ICD-10-CM | POA: Diagnosis not present

## 2016-10-25 DIAGNOSIS — E039 Hypothyroidism, unspecified: Secondary | ICD-10-CM | POA: Diagnosis not present

## 2016-10-25 DIAGNOSIS — M8580 Other specified disorders of bone density and structure, unspecified site: Secondary | ICD-10-CM | POA: Diagnosis not present

## 2016-10-25 DIAGNOSIS — Z9181 History of falling: Secondary | ICD-10-CM | POA: Diagnosis not present

## 2016-10-27 DIAGNOSIS — Z7982 Long term (current) use of aspirin: Secondary | ICD-10-CM | POA: Diagnosis not present

## 2016-10-27 DIAGNOSIS — Z9181 History of falling: Secondary | ICD-10-CM | POA: Diagnosis not present

## 2016-10-27 DIAGNOSIS — E559 Vitamin D deficiency, unspecified: Secondary | ICD-10-CM | POA: Diagnosis not present

## 2016-10-27 DIAGNOSIS — E119 Type 2 diabetes mellitus without complications: Secondary | ICD-10-CM | POA: Diagnosis not present

## 2016-10-27 DIAGNOSIS — I10 Essential (primary) hypertension: Secondary | ICD-10-CM | POA: Diagnosis not present

## 2016-10-27 DIAGNOSIS — Z471 Aftercare following joint replacement surgery: Secondary | ICD-10-CM | POA: Diagnosis not present

## 2016-10-27 DIAGNOSIS — E039 Hypothyroidism, unspecified: Secondary | ICD-10-CM | POA: Diagnosis not present

## 2016-10-27 DIAGNOSIS — M8580 Other specified disorders of bone density and structure, unspecified site: Secondary | ICD-10-CM | POA: Diagnosis not present

## 2016-10-27 DIAGNOSIS — E785 Hyperlipidemia, unspecified: Secondary | ICD-10-CM | POA: Diagnosis not present

## 2016-10-27 DIAGNOSIS — Z96652 Presence of left artificial knee joint: Secondary | ICD-10-CM | POA: Diagnosis not present

## 2016-10-28 DIAGNOSIS — E039 Hypothyroidism, unspecified: Secondary | ICD-10-CM | POA: Diagnosis not present

## 2016-10-28 DIAGNOSIS — Z7982 Long term (current) use of aspirin: Secondary | ICD-10-CM | POA: Diagnosis not present

## 2016-10-28 DIAGNOSIS — E785 Hyperlipidemia, unspecified: Secondary | ICD-10-CM | POA: Diagnosis not present

## 2016-10-28 DIAGNOSIS — M8580 Other specified disorders of bone density and structure, unspecified site: Secondary | ICD-10-CM | POA: Diagnosis not present

## 2016-10-28 DIAGNOSIS — I10 Essential (primary) hypertension: Secondary | ICD-10-CM | POA: Diagnosis not present

## 2016-10-28 DIAGNOSIS — E559 Vitamin D deficiency, unspecified: Secondary | ICD-10-CM | POA: Diagnosis not present

## 2016-10-28 DIAGNOSIS — Z9181 History of falling: Secondary | ICD-10-CM | POA: Diagnosis not present

## 2016-10-28 DIAGNOSIS — Z471 Aftercare following joint replacement surgery: Secondary | ICD-10-CM | POA: Diagnosis not present

## 2016-10-28 DIAGNOSIS — E119 Type 2 diabetes mellitus without complications: Secondary | ICD-10-CM | POA: Diagnosis not present

## 2016-10-28 DIAGNOSIS — Z96652 Presence of left artificial knee joint: Secondary | ICD-10-CM | POA: Diagnosis not present

## 2016-11-01 ENCOUNTER — Ambulatory Visit: Payer: Medicare Other | Attending: Orthopedic Surgery | Admitting: Physical Therapy

## 2016-11-01 ENCOUNTER — Encounter: Payer: Self-pay | Admitting: Physical Therapy

## 2016-11-01 DIAGNOSIS — M25562 Pain in left knee: Secondary | ICD-10-CM | POA: Diagnosis not present

## 2016-11-01 DIAGNOSIS — R600 Localized edema: Secondary | ICD-10-CM | POA: Diagnosis not present

## 2016-11-01 DIAGNOSIS — M25662 Stiffness of left knee, not elsewhere classified: Secondary | ICD-10-CM | POA: Insufficient documentation

## 2016-11-01 DIAGNOSIS — R262 Difficulty in walking, not elsewhere classified: Secondary | ICD-10-CM

## 2016-11-01 NOTE — Therapy (Signed)
[facility] [place], [place], [address] Phone: [phone]   Fax:  [phone]  Physical Therapy Evaluation  Patient Details  Name: Mckenzie Leonard MRN: [id number] Date of Birth: [birth year] Referring Provider: Arther Abbott, MD  Encounter Date: 11/01/2016      PT End of Session - 11/01/16 1221    Visit Number 1   Number of Visits 16   Date for PT Re-Evaluation 12/30/16   Authorization Type KX modifier after visist 15 and G-code every 10th visit   PT Start Time 1115   PT Stop Time 1210   PT Time Calculation (min) 55 min      Past Medical History:  Diagnosis Date  . Arthritis   . Diabetes mellitus without complication ([place])   . High cholesterol   . Hypertension   . Hypothyroidism   . PONV (postoperative nausea and vomiting)   . Thyroid disease     Past Surgical History:  Procedure Laterality Date  . CARPAL TUNNEL RELEASE     Right  . CLOSED REDUCTION MANDIBLE  02/20/2012   Procedure: CLOSED REDUCTION MANDIBULAR;  Surgeon: Isac Caddy, DDS;  Location: [facility];  Service: Oral Surgery;  Laterality: Right;  Closed reduction zygonach arch fx  . KNEE CARTILAGE SURGERY Left   . ORIF FACIAL FRACTURE  02/20/2012   Procedure: OPEN REDUCTION INTERNAL FIXATION (ORIF) MULTIPLE FACIAL FRACTURES;  Surgeon: Isac Caddy, DDS;  Location: [facility];  Service: Oral Surgery;  Laterality: Right;  ORIF Rt ZMC (zygomaxillary)  . TOTAL KNEE ARTHROPLASTY Left 10/12/2016   Procedure: TOTAL KNEE ARTHROPLASTY;  Surgeon: Carole Civil, MD;  Location: [facility];  Service: Orthopedics;  Laterality: Left;    There were no vitals filed for this visit.       Subjective Assessment - 11/01/16 1217    Subjective Patient arriving to therapy s/p Left TKR on 10/12/16. Pt reporting having 6 HHPT visits.    Limitations House hold activities;Walking;Standing   How long can you sit comfortably? 30 minutes   How long can you stand  comfortably? 15 minutes   How long can you walk comfortably? 1 hour   Patient Stated Goals Walk Normally with no cane   Currently in Pain? Yes   Pain Score 4    Pain Location Knee   Pain Orientation Left   Pain Descriptors / Indicators Aching;Tightness   Pain Type Surgical pain   Pain Onset 1 to 4 weeks ago   Pain Frequency Constant   Aggravating Factors  bending, first thing in the morning   Pain Relieving Factors resting, elevation, pain meds   Effect of Pain on Daily Activities difficutly walking and with ADL's            [facility] PT Assessment - 11/01/16 0001      Assessment   Medical Diagnosis Left TKR   Referring Provider Arther Abbott, MD   Onset Date/Surgical Date 10/12/16   Hand Dominance Right   Prior Therapy HHPT 6 visits, outpt PT 12 years ago after arthoscopic knee surgery     Precautions   Precautions None     Restrictions   Weight Bearing Restrictions No     Balance Screen   Has the patient fallen in the past 6 months No   Is the patient reluctant to leave their home because of a fear of falling?  No     Home Social worker Private residence   Living Arrangements Alone  Type of [place] to enter   Entrance Stairs-Number of Steps 2   Entrance Stairs-Rails Can reach both   Home Layout One level   [place] - 2 wheels;Kasandra Knudsen - single point     Prior Function   Level of Independence Independent   Vocation Retired   Leisure shopping     Cognition   Overall Cognitive Status Within Functional Limits for tasks assessed     Observation/Other Assessments   Focus on Therapeutic Outcomes (FOTO)  47% limitation     Posture/Postural Control   Posture/Postural Control Postural limitations   Postural Limitations Rounded Shoulders;Forward head     ROM / Strength   AROM / PROM / Strength AROM;PROM;Strength     AROM   Overall AROM  Deficits   AROM Assessment Site Knee   Right/Left Knee Left   Left  Knee Extension 15   Left Knee Flexion 80     PROM   Overall PROM  Deficits   PROM Assessment Site Knee   Right/Left Knee Left   Left Knee Extension 12   Left Knee Flexion 90     Strength   Overall Strength Deficits   Strength Assessment Site Knee   Right/Left Knee Left   Left Knee Flexion 3-/5   Left Knee Extension 3-/5     Transfers   Transfers Sit to Stand   Sit to Stand 7: Independent   Five time sit to stand comments  35 seconds  with UE support     Ambulation/Gait   Ambulation/Gait No   Ambulation Distance (Feet) 30 Feet   Assistive device Straight cane   Gait Pattern Decreased dorsiflexion - left;Antalgic   Ambulation Surface Level;Indoor            Objective measurements completed on examination: See above findings.                  PT Education - 11/01/16 1220    Education provided Yes   Education Details HEP   Person(s) Educated Patient;Other (comment)  sister   Methods Explanation;Demonstration;Verbal cues;Handout   Comprehension Verbalized understanding;Returned demonstration          PT Short Term Goals - 11/01/16 1232      PT SHORT TERM GOAL #1   Title Pt will be indepedent with her HEP with initial exercises.    Time 3   Period Weeks   Status New           PT Long Term Goals - 11/01/16 1237      PT LONG TERM GOAL #1   Title Pt will improve her FOTO score from 47% limitiation to </= 35% limitation.   Time 8   Period Weeks   Status New   Target Date 12/30/16     PT LONG TERM GOAL #2   Title Pt will be able to amb >/= 1000 feet with no assistive device.    Time 8   Period Weeks   Status New   Target Date 12/30/16     PT LONG TERM GOAL #3   Title Pt will improve her left knee flexion to >/= 120 degrees in order to improve functional mobility.    Baseline AROM: 80 degrees 11/01/16   Time 8   Period Weeks   Status New   Target Date 12/30/16     PT LONG TERM GOAL #4   Title Pt will improve her left knee  extension to </=  5 degrees in order to improve functional mobility.    Baseline 14 degrees flexed on 11/28/16   Time 8   Period Weeks   Status New   Target Date 12/30/16                Plan - 11-28-2016 1223    Clinical Impression Statement Pt presenting today as a low complexity evaluation s/p left TKR on 10/12/16. Pt reporting 6 HHPT visits. Pt able to verbally state her standing HEP that she has been completing daily. Pt amb into clinic with straight cane with antalgic gait. Pt presenting with weakness in her L LE with limited ROM. Pt with left LE edema noted. Skilled PT needed to progress pt toward her PLOF.    Clinical Presentation Stable   Clinical Decision Making Low   Rehab Potential Excellent   PT Frequency 2x / week   PT Duration 8 weeks   PT Treatment/Interventions ADLs/Self Care Home Management;Cryotherapy;Electrical Stimulation;Iontophoresis 4mg /ml Dexamethasone;Ultrasound;Passive range of motion;Therapeutic exercise;Balance training;Therapeutic activities;Functional mobility training;Stair training;Gait training;Patient/family education;Manual techniques;Vasopneumatic Device;Taping   PT Next Visit Plan Measure knee circumference, Bike vs Nustep, L knee ROM exercises, strengthening, E-stim, vasopneumatic   PT Home Exercise Plan hamstirng stretches, heel slides, prone knee hangs, quad sets   Consulted and Agree with Plan of Care Patient      Patient will benefit from skilled therapeutic intervention in order to improve the following deficits and impairments:  Abnormal gait, Pain, Impaired flexibility, Decreased range of motion, Difficulty walking, Decreased mobility, Decreased balance, Decreased strength, Decreased activity tolerance  Visit Diagnosis: Acute pain of left knee  Difficulty in walking, not elsewhere classified  Stiffness of left knee, not elsewhere classified  Localized edema      G-Codes - 2016/11/28 1256    Functional Assessment Tool Used (Outpatient  Only) FOTO, clinical assessment   Functional Limitation Mobility: Walking and moving around   Mobility: Walking and Moving Around Current Status [phone]) At least 40 percent but less than 60 percent impaired, limited or restricted   Mobility: Walking and Moving Around Goal Status [phone]) At least 20 percent but less than 40 percent impaired, limited or restricted       Problem List Patient Active Problem List   Diagnosis Date Noted  . Primary osteoarthritis of left knee   . Osteopenia 04/30/2014  . BMI 32.0-32.9,adult 09/16/2013  . Vitamin D deficiency 09/16/2013  . Hypokalemia 02/18/2012  . HTN (hypertension) 02/18/2012  . Type 2 diabetes mellitus, controlled ([place]) 02/18/2012  . Hyperlipidemia 02/18/2012  . Hypothyroidism 02/18/2012    Oretha Caprice , MPT 28-Nov-2016, 2:16 PM  [facility] [address] [place], [place], [address] Phone: [phone]   Fax:  [phone]  Name: NARELY NOBLES MRN: [id number] Date of Birth: June 01, 1950

## 2016-11-01 NOTE — Patient Instructions (Signed)
     Hamstring stretch:  Hold 30 seconds Repeat 3 times, twice a day  Quad Sets: towel roll under knee Hold 5-10 seconds Repeat 15 times, 2 sets Twice a day   Heel slides: Bend knee back and forth, 15 times 2 sets Twice a day     Prone knee hangs: build up to 10 minute hold  1-2 times each day

## 2016-11-03 ENCOUNTER — Ambulatory Visit: Payer: Medicare Other | Admitting: *Deleted

## 2016-11-03 DIAGNOSIS — R600 Localized edema: Secondary | ICD-10-CM

## 2016-11-03 DIAGNOSIS — R262 Difficulty in walking, not elsewhere classified: Secondary | ICD-10-CM

## 2016-11-03 DIAGNOSIS — M25662 Stiffness of left knee, not elsewhere classified: Secondary | ICD-10-CM | POA: Diagnosis not present

## 2016-11-03 DIAGNOSIS — M25562 Pain in left knee: Secondary | ICD-10-CM

## 2016-11-03 NOTE — Therapy (Signed)
[facility] [place], [place], [address] Phone: [phone]   Fax:  [phone]  Physical Therapy Treatment  Patient Details  Name: Mckenzie Leonard MRN: [id number] Date of Birth: 03/06/1950 Referring Provider: Arther Abbott, MD  Encounter Date: 11/03/2016      PT End of Session - 11/03/16 1607    Visit Number 2   Number of Visits 16   Date for PT Re-Evaluation 12/30/16   Authorization Type KX modifier after visist 15 and G-code every 10th visit   PT Start Time 5701   PT Stop Time 1609   PT Time Calculation (min) 54 min      Past Medical History:  Diagnosis Date  . Arthritis   . Diabetes mellitus without complication ([place])   . High cholesterol   . Hypertension   . Hypothyroidism   . PONV (postoperative nausea and vomiting)   . Thyroid disease     Past Surgical History:  Procedure Laterality Date  . CARPAL TUNNEL RELEASE     Right  . CLOSED REDUCTION MANDIBLE  02/20/2012   Procedure: CLOSED REDUCTION MANDIBULAR;  Surgeon: Isac Caddy, DDS;  Location: [place];  Service: Oral Surgery;  Laterality: Right;  Closed reduction zygonach arch fx  . KNEE CARTILAGE SURGERY Left   . ORIF FACIAL FRACTURE  02/20/2012   Procedure: OPEN REDUCTION INTERNAL FIXATION (ORIF) MULTIPLE FACIAL FRACTURES;  Surgeon: Isac Caddy, DDS;  Location: [place];  Service: Oral Surgery;  Laterality: Right;  ORIF Rt ZMC (zygomaxillary)  . TOTAL KNEE ARTHROPLASTY Left 10/12/2016   Procedure: TOTAL KNEE ARTHROPLASTY;  Surgeon: Carole Civil, MD;  [facility]: [facility];  Service: Orthopedics;  Laterality: Left;    There were no vitals filed for this visit.      Subjective Assessment - 11/03/16 1544    Subjective Patient arriving to therapy s/p Left TKR on 10/12/16. Pt reporting having 6 HHPT visits.    Limitations House hold activities;Walking;Standing   How long can you sit comfortably? 30 minutes   How long can you stand  comfortably? 15 minutes   How long can you walk comfortably? 1 hour   Pain Score 4    Pain Location Knee   Pain Orientation Left   Pain Descriptors / Indicators Aching   Pain Type Surgical pain                         [facility] Adult PT Treatment/Exercise - 11/03/16 0001      Exercises   Exercises Knee/Hip     Knee/Hip Exercises: Aerobic   Nustep L3 x 15 mins seat9,8 for ROM progression     Knee/Hip Exercises: Standing   Forward Lunges Left;10 reps;2 sets   Rocker Board 3 minutes     Knee/Hip Exercises: Supine   Short Arc Quad Sets Left;3 sets;10 reps  2#   Short Arc Quad Sets Limitations 2     Modalities   Modalities Passenger transport manager Location IFC x15 mins 1-10hz     Electrical Stimulation Goals Pain;Edema     Vasopneumatic   Number Minutes Vasopneumatic  15 minutes   Vasopnuematic Location  Knee   Vasopneumatic Pressure Low   Vasopneumatic Temperature  34     Manual Therapy   Manual Therapy Soft tissue mobilization;Passive ROM   Passive ROM PROM foe flexion/ extension, patella mobs and scar mobility  PT Short Term Goals - 11/01/16 1232      PT SHORT TERM GOAL #1   Title Pt will be indepedent with her HEP with initial exercises.    Time 3   Period Weeks   Status New           PT Long Term Goals - 11/01/16 1237      PT LONG TERM GOAL #1   Title Pt will improve her FOTO score from 47% limitiation to </= 35% limitation.   Time 8   Period Weeks   Status New   Target Date 12/30/16     PT LONG TERM GOAL #2   Title Pt will be able to amb >/= 1000 feet with no assistive device.    Time 8   Period Weeks   Status New   Target Date 12/30/16     PT LONG TERM GOAL #3   Title Pt will improve her left knee flexion to >/= 120 degrees in order to improve functional mobility.    Baseline AROM: 80 degrees 11/01/16   Time 8   Period Weeks   Status New    Target Date 12/30/16     PT LONG TERM GOAL #4   Title Pt will improve her left knee extension to </= 5 degrees in order to improve functional mobility.    Baseline 14 degrees flexed on 11/01/16   Time 8   Period Weeks   Status New   Target Date 12/30/16               Plan - 11/03/16 1608    Clinical Presentation Stable   Clinical Decision Making Low   Rehab Potential Excellent   PT Frequency 2x / week   PT Duration 8 weeks   PT Treatment/Interventions ADLs/Self Care Home Management;Cryotherapy;Electrical Stimulation;Iontophoresis 4mg /ml Dexamethasone;Ultrasound;Passive range of motion;Therapeutic exercise;Balance training;Therapeutic activities;Functional mobility training;Stair training;Gait training;Patient/family education;Manual techniques;Vasopneumatic Device;Taping   PT Next Visit Plan Measure knee circumference, Bike vs Nustep, L knee ROM exercises, strengthening, E-stim, vasopneumatic   PT Home Exercise Plan hamstirng stretches, heel slides, prone knee hangs, quad sets   Consulted and Agree with Plan of Care Patient      Patient will benefit from skilled therapeutic intervention in order to improve the following deficits and impairments:  Abnormal gait, Pain, Impaired flexibility, Decreased range of motion, Difficulty walking, Decreased mobility, Decreased balance, Decreased strength, Decreased activity tolerance  Visit Diagnosis: Acute pain of left knee  Difficulty in walking, not elsewhere classified  Stiffness of left knee, not elsewhere classified  Localized edema     Problem List Patient Active Problem List   Diagnosis Date Noted  . Primary osteoarthritis of left knee   . Osteopenia 04/30/2014  . BMI 32.0-32.9,adult 09/16/2013  . Vitamin D deficiency 09/16/2013  . Hypokalemia 02/18/2012  . HTN (hypertension) 02/18/2012  . Type 2 diabetes mellitus, controlled ([place]) 02/18/2012  . Hyperlipidemia 02/18/2012  . Hypothyroidism 02/18/2012     RAMSEUR,CHRIS , PTA 11/03/2016, 5:36 PM  [facility] [address] [place], [place], [address] Phone: [phone]   Fax:  [phone]  Name: Mckenzie Leonard MRN: [id number] Date of Birth: 09-08-1950

## 2016-11-07 ENCOUNTER — Encounter: Payer: Self-pay | Admitting: Physical Therapy

## 2016-11-07 ENCOUNTER — Ambulatory Visit: Payer: Medicare Other | Admitting: Physical Therapy

## 2016-11-07 DIAGNOSIS — R600 Localized edema: Secondary | ICD-10-CM

## 2016-11-07 DIAGNOSIS — R262 Difficulty in walking, not elsewhere classified: Secondary | ICD-10-CM

## 2016-11-07 DIAGNOSIS — M25662 Stiffness of left knee, not elsewhere classified: Secondary | ICD-10-CM | POA: Diagnosis not present

## 2016-11-07 DIAGNOSIS — M25562 Pain in left knee: Secondary | ICD-10-CM | POA: Diagnosis not present

## 2016-11-07 NOTE — Therapy (Signed)
[facility] [place], [place], [address] Phone: [phone]   Fax:  [phone]  Physical Therapy Treatment  Patient Details  Name: Mckenzie Leonard MRN: [id number] Date of Birth: 1950/09/23 Referring Provider: Arther Abbott, MD  Encounter Date: 11/07/2016      PT End of Session - 11/07/16 1110    Visit Number 3   Number of Visits 16   Date for PT Re-Evaluation 12/30/16   Authorization Type KX modifier after visist 15 and G-code every 10th visit   PT Start Time 1030   PT Stop Time 1120   PT Time Calculation (min) 50 min   Activity Tolerance Patient tolerated treatment well   Behavior During Therapy [facility] for tasks assessed/performed      Past Medical History:  Diagnosis Date  . Arthritis   . Diabetes mellitus without complication ([place])   . High cholesterol   . Hypertension   . Hypothyroidism   . PONV (postoperative nausea and vomiting)   . Thyroid disease     Past Surgical History:  Procedure Laterality Date  . CARPAL TUNNEL RELEASE     Right  . CLOSED REDUCTION MANDIBLE  02/20/2012   Procedure: CLOSED REDUCTION MANDIBULAR;  Surgeon: Isac Caddy, DDS;  Location: [place];  Service: Oral Surgery;  Laterality: Right;  Closed reduction zygonach arch fx  . KNEE CARTILAGE SURGERY Left   . ORIF FACIAL FRACTURE  02/20/2012   Procedure: OPEN REDUCTION INTERNAL FIXATION (ORIF) MULTIPLE FACIAL FRACTURES;  Surgeon: Isac Caddy, DDS;  Location: [place];  Service: Oral Surgery;  Laterality: Right;  ORIF Rt ZMC (zygomaxillary)  . TOTAL KNEE ARTHROPLASTY Left 10/12/2016   Procedure: TOTAL KNEE ARTHROPLASTY;  Surgeon: Carole Civil, MD;  [facility];  Service: Orthopedics;  Laterality: Left;    There were no vitals filed for this visit.      Subjective Assessment - 11/07/16 1039    Subjective Patient reported a pop in knee over weekend for unknown reason, no increased pain more than usual   Limitations House hold activities;Walking;Standing   How long can you sit comfortably? 30 minutes   How long can you stand comfortably? 15 minutes   How long can you walk comfortably? 1 hour   Patient Stated Goals Walk Normally with no cane   Currently in Pain? Yes   Pain Score 4    Pain Location Knee   Pain Orientation Left   Pain Descriptors / Indicators Aching   Pain Type Surgical pain   Pain Onset 1 to 4 weeks ago   Pain Frequency Constant   Aggravating Factors  in the morning / ROM   Pain Relieving Factors rest and meds            [facility] PT Assessment - 11/07/16 0001      ROM / Strength   AROM / PROM / Strength AROM;PROM     AROM   AROM Assessment Site Knee   Right/Left Knee Left   Left Knee Extension -10   Left Knee Flexion 95     PROM   PROM Assessment Site Knee   Right/Left Knee Left   Left Knee Extension -6   Left Knee Flexion 104                     [facility] Adult PT Treatment/Exercise - 11/07/16 0001      Knee/Hip Exercises: Stretches   Knee: Self-Stretch to increase Flexion Left;3 reps;30 seconds  Knee/Hip Exercises: Aerobic   Nustep L4 x 15 mins seat9,8 for ROM progression     Knee/Hip Exercises: Standing   Rocker Board 3 minutes     Electrical Stimulation   Electrical Stimulation Location IFC x15 mins 1-_0     Electrical Stimulation Goals Pain;Edema     Vasopneumatic   Number Minutes Vasopneumatic  15 minutes   Vasopnuematic Location  Knee   Vasopneumatic Pressure Low     Manual Therapy   Manual Therapy Passive ROM   Passive ROM manual gentle PROM for left knee flexion with holds end range and ext overpressure                  PT Short Term Goals - 11/07/16 1051      PT SHORT TERM GOAL #1   Title Pt will be indepedent with her HEP with initial exercises.    Time 3   Period Weeks   Status Achieved           PT Long Term Goals - 11/07/16 1051      PT LONG TERM GOAL #1   Title Pt will improve her FOTO  score from 47% limitiation to </= 35% limitation.   Time 8   Period Weeks   Status On-going     PT LONG TERM GOAL #2   Title Pt will be able to amb >/= 1000 feet with no assistive device.    Time 8   Period Weeks   Status On-going     PT LONG TERM GOAL #3   Title Pt will improve her left knee flexion to >/= 120 degrees in order to improve functional mobility.    Baseline AROM: 80 degrees 11/01/16   Time 8   Period Weeks   Status On-going     PT LONG TERM GOAL #4   Title Pt will improve her left knee extension to </= 5 degrees in order to improve functional mobility.    Baseline 14 degrees flexed on 11/01/16   Time 8   Period Weeks   Status On-going               Plan - 11/07/16 1111    Clinical Impression Statement Patient tolerated treatment well today. Patient reported doing well with HEP overall. Patient has improved ROM for left knee flexion and ext today. Patient has significant swelling in knee which limits ROM and causes some discomfort. Educated patient on elevation and ice. Patient met STG #1 other LTG's ongoing.   Rehab Potential Excellent   PT Frequency 2x / week   PT Duration 8 weeks   PT Treatment/Interventions ADLs/Self Care Home Management;Cryotherapy;Electrical Stimulation;Iontophoresis 12m/ml Dexamethasone;Ultrasound;Passive range of motion;Therapeutic exercise;Balance training;Therapeutic activities;Functional mobility training;Stair training;Gait training;Patient/family education;Manual techniques;Vasopneumatic Device;Taping   PT Next Visit Plan cont with Bike vs Nustep, L knee ROM exercises, progress strengthening (step ups, LAQ with #), E-stim, vasopneumatic   Consulted and Agree with Plan of Care Patient      Patient will benefit from skilled therapeutic intervention in order to improve the following deficits and impairments:  Abnormal gait, Pain, Impaired flexibility, Decreased range of motion, Difficulty walking, Decreased mobility, Decreased balance,  Decreased strength, Decreased activity tolerance  Visit Diagnosis: Acute pain of left knee  Difficulty in walking, not elsewhere classified  Localized edema  Stiffness of left knee, not elsewhere classified     Problem List Patient Active Problem List   Diagnosis Date Noted  . Primary osteoarthritis of left knee   . Osteopenia  04/30/2014  . BMI 32.0-32.9,adult 09/16/2013  . Vitamin D deficiency 09/16/2013  . Hypokalemia 02/18/2012  . HTN (hypertension) 02/18/2012  . Type 2 diabetes mellitus, controlled ([place]) 02/18/2012  . Hyperlipidemia 02/18/2012  . Hypothyroidism 02/18/2012    Bosten Newstrom P, PTA 11/07/2016, 11:23 AM  [facility] [place], [place], [address] Phone: [phone]   Fax:  [phone]  Name: Mckenzie Leonard MRN: [id number] Date of Birth: 06/01/1950

## 2016-11-10 ENCOUNTER — Ambulatory Visit: Payer: Medicare Other | Admitting: *Deleted

## 2016-11-10 DIAGNOSIS — R600 Localized edema: Secondary | ICD-10-CM | POA: Diagnosis not present

## 2016-11-10 DIAGNOSIS — M25662 Stiffness of left knee, not elsewhere classified: Secondary | ICD-10-CM

## 2016-11-10 DIAGNOSIS — R262 Difficulty in walking, not elsewhere classified: Secondary | ICD-10-CM | POA: Diagnosis not present

## 2016-11-10 DIAGNOSIS — M25562 Pain in left knee: Secondary | ICD-10-CM | POA: Diagnosis not present

## 2016-11-10 NOTE — Therapy (Signed)
[facility] [place], [place], [address] Phone: [phone]   Fax:  [phone]  Physical Therapy Treatment  Patient Details  Name: Mckenzie Leonard MRN: [id number] Date of Birth: [birth year] Referring Provider: Arther Abbott, MD  Encounter Date: 11/10/2016      PT End of Session - 11/10/16 1124    Visit Number 4   Number of Visits 16   Date for PT Re-Evaluation 12/30/16   Authorization Type KX modifier after visist 15 and G-code every 10th visit   PT Start Time 1030   PT Stop Time 1125   PT Time Calculation (min) 55 min      Past Medical History:  Diagnosis Date  . Arthritis   . Diabetes mellitus without complication ([place])   . High cholesterol   . Hypertension   . Hypothyroidism   . PONV (postoperative nausea and vomiting)   . Thyroid disease     Past Surgical History:  Procedure Laterality Date  . CARPAL TUNNEL RELEASE     Right  . CLOSED REDUCTION MANDIBLE  02/20/2012   Procedure: CLOSED REDUCTION MANDIBULAR;  Surgeon: Isac Caddy, DDS;  Location: [place];  Service: Oral Surgery;  Laterality: Right;  Closed reduction zygonach arch fx  . KNEE CARTILAGE SURGERY Left   . ORIF FACIAL FRACTURE  02/20/2012   Procedure: OPEN REDUCTION INTERNAL FIXATION (ORIF) MULTIPLE FACIAL FRACTURES;  Surgeon: Isac Caddy, DDS;  Location: [place];  Service: Oral Surgery;  Laterality: Right;  ORIF Rt ZMC (zygomaxillary)  . TOTAL KNEE ARTHROPLASTY Left 10/12/2016   Procedure: TOTAL KNEE ARTHROPLASTY;  Surgeon: Carole Civil, MD;  [facility];  Service: Orthopedics;  Laterality: Left;    There were no vitals filed for this visit.      Subjective Assessment - 11/10/16 1049    Subjective Doing better with LT knee pain 3/10   Limitations House hold activities;Walking;Standing   How long can you sit comfortably? 30 minutes   How long can you stand comfortably? 15 minutes   How long can you walk  comfortably? 1 hour   Patient Stated Goals Walk Normally with no cane   Currently in Pain? Yes   Pain Score 3    Pain Location Knee   Pain Orientation Left   Pain Descriptors / Indicators Aching   Pain Type Surgical pain   Pain Onset 1 to 4 weeks ago   Pain Frequency Constant                         [facility] Adult PT Treatment/Exercise - 11/10/16 0001      Exercises   Exercises Knee/Hip     Knee/Hip Exercises: Aerobic   Nustep L4 x 15 mins seat9,8 for ROM progression     Knee/Hip Exercises: Standing   Forward Lunges Left;10 reps;1 set;5 seconds   Forward Step Up 3 sets;Step Height: 6"   Rocker Board 3 minutes     Modalities   Modalities Passenger transport manager Location IFC x15 mins 1-10hz     Electrical Stimulation Goals Pain;Edema     Vasopneumatic   Number Minutes Vasopneumatic  15 minutes   Vasopnuematic Location  Knee   Vasopneumatic Pressure Low   Vasopneumatic Temperature  34     Manual Therapy   Manual Therapy Passive ROM   Passive ROM manual gentle PROM for left knee flexion with holds end range and ext  overpressure                  PT Short Term Goals - 11/07/16 1051      PT SHORT TERM GOAL #1   Title Pt will be indepedent with her HEP with initial exercises.    Time 3   Period Weeks   Status Achieved           PT Long Term Goals - 11/07/16 1051      PT LONG TERM GOAL #1   Title Pt will improve her FOTO score from 47% limitiation to </= 35% limitation.   Time 8   Period Weeks   Status On-going     PT LONG TERM GOAL #2   Title Pt will be able to amb >/= 1000 feet with no assistive device.    Time 8   Period Weeks   Status On-going     PT LONG TERM GOAL #3   Title Pt will improve her left knee flexion to >/= 120 degrees in order to improve functional mobility.    Baseline AROM: 80 degrees 11/01/16   Time 8   Period Weeks   Status On-going     PT  LONG TERM GOAL #4   Title Pt will improve her left knee extension to </= 5 degrees in order to improve functional mobility.    Baseline 14 degrees flexed on 11/01/16   Time 8   Period Weeks   Status On-going               Plan - 11/10/16 1125    Clinical Impression Statement Pt arrived today doing fairly well with low LT knee pain and no AD. She was able to perform all therex with minimal discomfort and mainly fatigue. Her ROM was 4- 105 degrees today. LTGs are ongoing   Clinical Presentation Stable   Rehab Potential Excellent   PT Frequency 2x / week   PT Duration 8 weeks   PT Treatment/Interventions ADLs/Self Care Home Management;Cryotherapy;Electrical Stimulation;Iontophoresis 4mg /ml Dexamethasone;Ultrasound;Passive range of motion;Therapeutic exercise;Balance training;Therapeutic activities;Functional mobility training;Stair training;Gait training;Patient/family education;Manual techniques;Vasopneumatic Device;Taping   PT Home Exercise Plan hamstirng stretches, heel slides, prone knee hangs, quad sets   Consulted and Agree with Plan of Care Patient      Patient will benefit from skilled therapeutic intervention in order to improve the following deficits and impairments:     Visit Diagnosis: Acute pain of left knee  Difficulty in walking, not elsewhere classified  Localized edema  Stiffness of left knee, not elsewhere classified     Problem List Patient Active Problem List   Diagnosis Date Noted  . Primary osteoarthritis of left knee   . Osteopenia 04/30/2014  . BMI 32.0-32.9,adult 09/16/2013  . Vitamin D deficiency 09/16/2013  . Hypokalemia 02/18/2012  . HTN (hypertension) 02/18/2012  . Type 2 diabetes mellitus, controlled ([place]) 02/18/2012  . Hyperlipidemia 02/18/2012  . Hypothyroidism 02/18/2012    RAMSEUR,CHRIS 11/10/2016, 11:34 AM  [facility] [place], [place], [address] Phone: [phone]    Fax:  [phone]  Name: Mckenzie Leonard MRN: [id number] Date of Birth: 04/11/1950

## 2016-11-11 ENCOUNTER — Other Ambulatory Visit: Payer: Self-pay | Admitting: Orthopedic Surgery

## 2016-11-15 ENCOUNTER — Ambulatory Visit: Payer: Medicare Other | Admitting: *Deleted

## 2016-11-15 DIAGNOSIS — M25562 Pain in left knee: Secondary | ICD-10-CM

## 2016-11-15 DIAGNOSIS — M25662 Stiffness of left knee, not elsewhere classified: Secondary | ICD-10-CM | POA: Diagnosis not present

## 2016-11-15 DIAGNOSIS — R262 Difficulty in walking, not elsewhere classified: Secondary | ICD-10-CM | POA: Diagnosis not present

## 2016-11-15 DIAGNOSIS — R600 Localized edema: Secondary | ICD-10-CM | POA: Diagnosis not present

## 2016-11-15 NOTE — Therapy (Signed)
[facility] [place], [place], [address] Phone: [phone]   Fax:  [phone]  Physical Therapy Treatment  Patient Details  Name: Mckenzie Leonard MRN: [id number] Date of Birth: 1950-04-22 Referring Provider: Arther Abbott, MD  Encounter Date: 11/15/2016      PT End of Session - 11/15/16 1258    Visit Number 5   Number of Visits 16   Date for PT Re-Evaluation 12/30/16   Authorization Type KX modifier after visist 15 and G-code every 10th visit   PT Start Time 1300   PT Stop Time 1400   PT Time Calculation (min) 60 min      Past Medical History:  Diagnosis Date  . Arthritis   . Diabetes mellitus without complication ([place])   . High cholesterol   . Hypertension   . Hypothyroidism   . PONV (postoperative nausea and vomiting)   . Thyroid disease     Past Surgical History:  Procedure Laterality Date  . CARPAL TUNNEL RELEASE     Right  . CLOSED REDUCTION MANDIBLE  02/20/2012   Procedure: CLOSED REDUCTION MANDIBULAR;  Surgeon: Isac Caddy, DDS;  Location: [place];  Service: Oral Surgery;  Laterality: Right;  Closed reduction zygonach arch fx  . KNEE CARTILAGE SURGERY Left   . ORIF FACIAL FRACTURE  02/20/2012   Procedure: OPEN REDUCTION INTERNAL FIXATION (ORIF) MULTIPLE FACIAL FRACTURES;  Surgeon: Isac Caddy, DDS;  Location: [place];  Service: Oral Surgery;  Laterality: Right;  ORIF Rt ZMC (zygomaxillary)  . TOTAL KNEE ARTHROPLASTY Left 10/12/2016   Procedure: TOTAL KNEE ARTHROPLASTY;  Surgeon: Carole Civil, MD;  [facility];  Service: Orthopedics;  Laterality: Left;    There were no vitals filed for this visit.      Subjective Assessment - 11/15/16 1257    Subjective Doing better with LT knee pain 3/10   Limitations House hold activities;Walking;Standing   How long can you sit comfortably? 30 minutes   How long can you stand comfortably? 15 minutes   How long can you walk  comfortably? 1 hour   Patient Stated Goals Walk Normally with no cane   Currently in Pain? Yes   Pain Score 3    Pain Location Knee   Pain Orientation Left   Pain Descriptors / Indicators Aching   Pain Type Surgical pain   Pain Onset 1 to 4 weeks ago   Pain Frequency Constant                         [facility] Adult PT Treatment/Exercise - 11/15/16 0001      Exercises   Exercises Knee/Hip     Knee/Hip Exercises: Aerobic   Nustep L4 x 15 mins seat9,8 for ROM progression     Knee/Hip Exercises: Standing   Forward Lunges Left;10 reps;1 set;5 seconds   Lateral Step Up 2 sets;Left;Step Height: 6";10 reps   Forward Step Up Step Height: 6";2 sets;10 reps   Rocker Board 3 minutes     Modalities   Modalities Electrical Stimulation;Vasopneumatic     Acupuncturist Location IFC x15 mins 1-10hz     Electrical Stimulation Goals Pain;Edema     Vasopneumatic   Number Minutes Vasopneumatic  15 minutes   Vasopnuematic Location  Knee   Vasopneumatic Pressure Low   Vasopneumatic Temperature  34     Manual Therapy   Manual Therapy Passive ROM   Passive ROM manual gentle  PROM for left knee flexion( 110) with holds end range and ext (-5) overpressure                  PT Short Term Goals - 11/07/16 1051      PT SHORT TERM GOAL #1   Title Pt will be indepedent with her HEP with initial exercises.    Time 3   Period Weeks   Status Achieved           PT Long Term Goals - 11/07/16 1051      PT LONG TERM GOAL #1   Title Pt will improve her FOTO score from 47% limitiation to </= 35% limitation.   Time 8   Period Weeks   Status On-going     PT LONG TERM GOAL #2   Title Pt will be able to amb >/= 1000 feet with no assistive device.    Time 8   Period Weeks   Status On-going     PT LONG TERM GOAL #3   Title Pt will improve her left knee flexion to >/= 120 degrees in order to improve functional mobility.    Baseline AROM:  80 degrees 11/01/16   Time 8   Period Weeks   Status On-going     PT LONG TERM GOAL #4   Title Pt will improve her left knee extension to </= 5 degrees in order to improve functional mobility.    Baseline 14 degrees flexed on 11/01/16   Time 8   Period Weeks   Status On-going               Plan - 11/15/16 1354    Clinical Impression Statement Pt arrived today feeling fairlywell with 3/10 LT knee pain. She will probably start coming just 1xwk due to high co-pay. She is progressing farly well with current program with ROM today 5-110 degrees.   Rehab Potential Excellent   PT Frequency 2x / week   PT Duration 8 weeks   PT Treatment/Interventions ADLs/Self Care Home Management;Cryotherapy;Electrical Stimulation;Iontophoresis 4mg /ml Dexamethasone;Ultrasound;Passive range of motion;Therapeutic exercise;Balance training;Therapeutic activities;Functional mobility training;Stair training;Gait training;Patient/family education;Manual techniques;Vasopneumatic Device;Taping   PT Next Visit Plan cont with Bike vs Nustep, L knee ROM exercises, progress strengthening (step ups, LAQ with #), E-stim, vasopneumatic   PT Home Exercise Plan hamstirng stretches, heel slides, prone knee hangs, quad sets   Consulted and Agree with Plan of Care Patient      Patient will benefit from skilled therapeutic intervention in order to improve the following deficits and impairments:  Abnormal gait, Pain, Impaired flexibility, Decreased range of motion, Difficulty walking, Decreased mobility, Decreased balance, Decreased strength, Decreased activity tolerance  Visit Diagnosis: Difficulty in walking, not elsewhere classified  Localized edema  Acute pain of left knee  Stiffness of left knee, not elsewhere classified     Problem List Patient Active Problem List   Diagnosis Date Noted  . Primary osteoarthritis of left knee   . Osteopenia 04/30/2014  . BMI 32.0-32.9,adult 09/16/2013  . Vitamin D deficiency  09/16/2013  . Hypokalemia 02/18/2012  . HTN (hypertension) 02/18/2012  . Type 2 diabetes mellitus, controlled ([place]) 02/18/2012  . Hyperlipidemia 02/18/2012  . Hypothyroidism 02/18/2012    RAMSEUR,CHRIS, PTA 11/15/2016, 2:13 PM  [facility] [address] [place], [place], [address] Phone: [phone]   Fax:  [phone]  Name: CHALONDA SCHLATTER MRN: [id number] Date of Birth: Jul 19, 1950

## 2016-11-17 ENCOUNTER — Ambulatory Visit: Payer: Medicare Other | Admitting: Physical Therapy

## 2016-11-17 ENCOUNTER — Encounter: Payer: Self-pay | Admitting: Physical Therapy

## 2016-11-17 DIAGNOSIS — R600 Localized edema: Secondary | ICD-10-CM | POA: Diagnosis not present

## 2016-11-17 DIAGNOSIS — M25562 Pain in left knee: Secondary | ICD-10-CM | POA: Diagnosis not present

## 2016-11-17 DIAGNOSIS — R262 Difficulty in walking, not elsewhere classified: Secondary | ICD-10-CM | POA: Diagnosis not present

## 2016-11-17 DIAGNOSIS — M25662 Stiffness of left knee, not elsewhere classified: Secondary | ICD-10-CM | POA: Diagnosis not present

## 2016-11-17 NOTE — Therapy (Signed)
[facility] [place], [place], [address] Phone: [phone]   Fax:  [phone]  Physical Therapy Treatment  Patient Details  Name: Mckenzie Leonard MRN: [id number] Date of Birth: 04-24-1950 Referring Provider: Arther Abbott, MD  Encounter Date: 11/17/2016      PT End of Session - 11/17/16 1317    Visit Number 6   Number of Visits 16   Date for PT Re-Evaluation 12/30/16   Authorization Type KX modifier after visist 15 and G-code every 10th visit   PT Start Time 1300   PT Stop Time 1355   PT Time Calculation (min) 55 min   Activity Tolerance Patient tolerated treatment well   Behavior During Therapy [facility] for tasks assessed/performed      Past Medical History:  Diagnosis Date  . Arthritis   . Diabetes mellitus without complication ([place])   . High cholesterol   . Hypertension   . Hypothyroidism   . PONV (postoperative nausea and vomiting)   . Thyroid disease     Past Surgical History:  Procedure Laterality Date  . CARPAL TUNNEL RELEASE     Right  . CLOSED REDUCTION MANDIBLE  02/20/2012   Procedure: CLOSED REDUCTION MANDIBULAR;  Surgeon: Isac Caddy, DDS;  Location: [place];  Service: Oral Surgery;  Laterality: Right;  Closed reduction zygonach arch fx  . KNEE CARTILAGE SURGERY Left   . ORIF FACIAL FRACTURE  02/20/2012   Procedure: OPEN REDUCTION INTERNAL FIXATION (ORIF) MULTIPLE FACIAL FRACTURES;  Surgeon: Isac Caddy, DDS;  Location: [place];  Service: Oral Surgery;  Laterality: Right;  ORIF Rt ZMC (zygomaxillary)  . TOTAL KNEE ARTHROPLASTY Left 10/12/2016   Procedure: TOTAL KNEE ARTHROPLASTY;  Surgeon: Carole Civil, MD;  Location: [facility];  Service: Orthopedics;  Laterality: Left;    There were no vitals filed for this visit.      Subjective Assessment - 11/17/16 1314    Subjective Pt arriving to therapy reporting left knee pain of 4/10. pt reporting trying to take a muscle relaxor during  the day to help but reported increased dizziness. Discussed trying to take the prescription before going to bed to help with sleep. Pt instructed to contact her MD if it didn't seem to help relax her to help with sleeping or if dizziness continued.    Limitations House hold activities;Walking;Standing   How long can you sit comfortably? 30 minutes   How long can you stand comfortably? 15 minutes   How long can you walk comfortably? 1 hour   Patient Stated Goals Walk Normally with no cane   Currently in Pain? Yes   Pain Score 4    Pain Location Knee   Pain Orientation Left   Pain Descriptors / Indicators Aching   Pain Type Surgical pain   Pain Onset More than a month ago   Aggravating Factors  stiffness/pain in the morning, flexion   Pain Relieving Factors resting, medications   Effect of Pain on Daily Activities some difficulty with ADL's, pt reporting limping after prolonged walking            [facility] PT Assessment - 11/17/16 0001      AROM   AROM Assessment Site Knee   Right/Left Knee Left   Left Knee Extension 10   Left Knee Flexion 100     PROM   PROM Assessment Site Knee   Right/Left Knee Left   Left Knee Extension 8   Left Knee Flexion 105  Mckenzie Leonard Adult PT Treatment/Exercise - 11/17/16 0001      Exercises   Exercises Knee/Hip     Knee/Hip Exercises: Stretches   Active Hamstring Stretch 2 reps;30 seconds     Knee/Hip Exercises: Aerobic   Nustep L4 x 15 mins seat9,8 for ROM progression     Knee/Hip Exercises: Standing   Forward Lunges Left;10 reps;1 set;5 seconds   Lateral Step Up 2 sets;Left;Step Height: 6";10 reps   Forward Step Up Step Height: 6";2 sets;10 reps   Rocker Board 3 minutes     Knee/Hip Exercises: Supine   Quad Sets 15 reps   Heel Slides 15 reps   Heel Slides Limitations with towel   Straight Leg Raises Strengthening;15 reps   Straight Leg Raises Limitations needed verbal cues to perform quad set prior to  lifing and df her foot to prevent knee lag     Knee/Hip Exercises: Sidelying   Hip ADduction 10 reps   Clams 10 reps     Modalities   Modalities Electrical Stimulation;Vasopneumatic     Electrical Stimulation   Electrical Stimulation Location IFC x15 mins 1-10hz     Electrical Stimulation Goals Pain;Edema     Vasopneumatic   Number Minutes Vasopneumatic  15 minutes   Vasopnuematic Location  Knee   Vasopneumatic Pressure Low   Vasopneumatic Temperature  36     Manual Therapy   Manual Therapy Passive ROM   Manual therapy comments patella mobs, cross friction over scar tissue, STW to calf and medial and lateral knee musculature   Passive ROM manual gentle PROM for left knee flexion( 110) with holds end range and ext (-5) overpressure                PT Education - 11/17/16 1317    Education provided Yes   Education Details HEP   Person(s) Educated Patient   Methods Explanation   Comprehension Verbalized understanding          PT Short Term Goals - 11/07/16 1051      PT SHORT TERM GOAL #1   Title Pt will be indepedent with her HEP with initial exercises.    Time 3   Period Weeks   Status Achieved           PT Long Term Goals - 11/17/16 1338      PT LONG TERM GOAL #2   Title Pt will be able to amb >/= 1000 feet with no assistive device.    Time 8   Period Weeks   Status On-going     PT LONG TERM GOAL #3   Title Pt will improve her left knee flexion to >/= 120 degrees in order to improve functional mobility.    Baseline AROM: 80 degrees 11/01/16   Time 8   Period Weeks   Status On-going     PT LONG TERM GOAL #4   Title Pt will improve her left knee extension to </= 5 degrees in order to improve functional mobility.    Baseline 14 degrees flexed on 11/01/16   Time 8   Period Weeks   Status On-going               Plan - 11/17/16 1319    Clinical Impression Statement Pt arriving today reporting 4/10 pain and stiffness in the mornings. pt  reporting doing her HEP multiple times each day. Pt did report she may have, "over did it" yesterday with shopping and performing her HEP 3 times. Pt is making  progress with ROM and functional mobility. Continue skilled PT to progress toward LTG's.    Rehab Potential Excellent   PT Frequency 2x / week   PT Duration 8 weeks   PT Treatment/Interventions ADLs/Self Care Home Management;Cryotherapy;Electrical Stimulation;Iontophoresis 4mg /ml Dexamethasone;Ultrasound;Passive range of motion;Therapeutic exercise;Balance training;Therapeutic activities;Functional mobility training;Stair training;Gait training;Patient/family education;Manual techniques;Vasopneumatic Device;Taping   PT Next Visit Plan cont with Bike vs Nustep, L knee ROM exercises, progress strengthening (step ups, LAQ with #), E-stim, vasopneumatic   PT Home Exercise Plan hamstirng stretches, heel slides, prone knee hangs, quad sets   Consulted and Agree with Plan of Care Patient      Patient will benefit from skilled therapeutic intervention in order to improve the following deficits and impairments:  Abnormal gait, Pain, Impaired flexibility, Decreased range of motion, Difficulty walking, Decreased mobility, Decreased balance, Decreased strength, Decreased activity tolerance  Visit Diagnosis: Localized edema  Stiffness of left knee, not elsewhere classified  Acute pain of left knee  Difficulty in walking, not elsewhere classified     Problem List Patient Active Problem List   Diagnosis Date Noted  . Primary osteoarthritis of left knee   . Osteopenia 04/30/2014  . BMI 32.0-32.9,adult 09/16/2013  . Vitamin D deficiency 09/16/2013  . Hypokalemia 02/18/2012  . HTN (hypertension) 02/18/2012  . Type 2 diabetes mellitus, controlled ([place]) 02/18/2012  . Hyperlipidemia 02/18/2012  . Hypothyroidism 02/18/2012    Oretha Caprice, MPT 11/17/2016, 1:54 PM  [facility] [address] [place], [place], [address] Phone: [phone]   Fax:  [phone]  Name: Mckenzie Leonard MRN: [id number] Date of Birth: Sep 12, 1950

## 2016-11-22 ENCOUNTER — Encounter: Payer: Self-pay | Admitting: Nurse Practitioner

## 2016-11-22 ENCOUNTER — Ambulatory Visit (INDEPENDENT_AMBULATORY_CARE_PROVIDER_SITE_OTHER): Payer: Medicare Other | Admitting: Nurse Practitioner

## 2016-11-22 VITALS — BP 143/80 | HR 67 | Temp 96.5°F | Ht 62.0 in | Wt 195.0 lb

## 2016-11-22 DIAGNOSIS — E782 Mixed hyperlipidemia: Secondary | ICD-10-CM

## 2016-11-22 DIAGNOSIS — Z0000 Encounter for general adult medical examination without abnormal findings: Secondary | ICD-10-CM

## 2016-11-22 DIAGNOSIS — E876 Hypokalemia: Secondary | ICD-10-CM

## 2016-11-22 DIAGNOSIS — I10 Essential (primary) hypertension: Secondary | ICD-10-CM | POA: Diagnosis not present

## 2016-11-22 DIAGNOSIS — Z6832 Body mass index (BMI) 32.0-32.9, adult: Secondary | ICD-10-CM | POA: Diagnosis not present

## 2016-11-22 DIAGNOSIS — E559 Vitamin D deficiency, unspecified: Secondary | ICD-10-CM

## 2016-11-22 DIAGNOSIS — E119 Type 2 diabetes mellitus without complications: Secondary | ICD-10-CM | POA: Diagnosis not present

## 2016-11-22 DIAGNOSIS — M8580 Other specified disorders of bone density and structure, unspecified site: Secondary | ICD-10-CM

## 2016-11-22 DIAGNOSIS — E039 Hypothyroidism, unspecified: Secondary | ICD-10-CM

## 2016-11-22 LAB — MICROSCOPIC EXAMINATION: RENAL EPITHEL UA: NONE SEEN /HPF

## 2016-11-22 LAB — URINALYSIS, COMPLETE
BILIRUBIN UA: NEGATIVE
GLUCOSE, UA: NEGATIVE
KETONES UA: NEGATIVE
Nitrite, UA: NEGATIVE
Protein, UA: NEGATIVE
RBC UA: NEGATIVE
SPEC GRAV UA: 1.01 (ref 1.005–1.030)
Urobilinogen, Ur: 0.2 mg/dL (ref 0.2–1.0)
pH, UA: 6.5 (ref 5.0–7.5)

## 2016-11-22 LAB — BAYER DCA HB A1C WAIVED: HB A1C: 6 % (ref <7.0)

## 2016-11-22 MED ORDER — ATORVASTATIN CALCIUM 40 MG PO TABS: 40.0000 mg | ORAL_TABLET | Freq: Every day | ORAL | 1 refills | Status: DC

## 2016-11-22 MED ORDER — OLMESARTAN MEDOXOMIL-HCTZ 40-12.5 MG PO TABS: 1.0000 | ORAL_TABLET | Freq: Every day | ORAL | 3 refills | Status: DC

## 2016-11-22 MED ORDER — LEVOTHYROXINE SODIUM 50 MCG PO TABS: 50.0000 ug | ORAL_TABLET | Freq: Every day | ORAL | 1 refills | Status: DC

## 2016-11-22 NOTE — Addendum Note (Signed)
Addended by: Earlene Plater on: 11/22/2016 10:26 AM   Modules accepted: Orders

## 2016-11-22 NOTE — Patient Instructions (Signed)

## 2016-11-22 NOTE — Progress Notes (Signed)
Subjective:    Patient ID: Mckenzie Leonard, female    DOB: Jan 03, 1951, 66 y.o.   MRN: [id number]  HPI DEBORAH LAZCANO is here today for comprehensive physical exam/pap and follow up of chronic medical problem.  Outpatient Encounter Prescriptions as of 11/22/2016  Medication Sig  . atorvastatin (LIPITOR) 40 MG tablet Take 1 tablet (40 mg total) by mouth daily. (Patient taking differently: Take 40 mg by mouth daily at 6 PM. )  . Blood Glucose Monitoring Suppl (BLOOD GLUCOSE METER KIT AND SUPPLIES) KIT Dispense based on patient and insurance preference. Use to check BG once daily.  ICD 10 code = E11.9  . calcium-vitamin D (OSCAL WITH D) 500-200 MG-UNIT tablet Take 1 tablet by mouth 2 (two) times daily.  . Cholecalciferol (VITAMIN D) 2000 units CAPS Take 6,000 Units by mouth daily.   Marland Kitchen HYDROcodone-acetaminophen (NORCO) 5-325 MG tablet Take 1 tablet by mouth every 6 (six) hours as needed for moderate pain.  . IBU 800 MG tablet Take 1 Tablet by mouth every 8 hours as needed  . levothyroxine (SYNTHROID, LEVOTHROID) 50 MCG tablet Take 1 tablet (50 mcg total) by mouth daily.  . magnesium gluconate (MAGONATE) 500 MG tablet Take 500 mg by mouth daily.  . Multiple Vitamin (MULTIVITAMIN WITH MINERALS) TABS tablet Take 1 tablet by mouth daily.  Marland Kitchen olmesartan-hydrochlorothiazide (BENICAR HCT) 40-12.5 MG tablet Take 1 tablet by mouth daily.  . ONE TOUCH ULTRA TEST test strip USE TO check sugar ONCE daily OR PER directions  . TURMERIC PO Take 2 tablets by mouth 2 (two) times daily.   . [DISCONTINUED] aspirin EC 325 MG EC tablet Take 1 tablet (325 mg total) by mouth daily with breakfast.  . [DISCONTINUED] docusate sodium (COLACE) 100 MG capsule Take 1 capsule (100 mg total) by mouth 2 (two) times daily. (Patient not taking: Reported on 11/01/2016)  . [DISCONTINUED] gabapentin (NEURONTIN) 100 MG capsule Take 1 capsule (100 mg total) by mouth 3 (three) times daily. (Patient not taking: Reported on 11/01/2016)  .  [DISCONTINUED] methocarbamol (ROBAXIN) 500 MG tablet Take 1 tablet (500 mg total) by mouth every 6 (six) hours as needed for muscle spasms. (Patient not taking: Reported on 11/01/2016)   No facility-administered encounter medications on file as of 11/22/2016.     1. Annual physical exam   2. Essential hypertension  Blood pressure controlled with Benicar.  Patient does not check BP at home regularly.  Patient states this morning it was 134/75 at home.  3. Hypothyroidism, unspecified type  Managed with levothyroxine.  4. Controlled type 2 diabetes mellitus without complication, without long-term current use of insulin ([place])  Blood glucose managed with diet.  Monitoring fasting blood glucose regularly.  Patient states blood glucose runs 120-130s.  5. Osteopenia, unspecified location  Patient taking calcium supplement for bone support.  6. Hyperlipidemia, unspecified hyperlipidemia type Managed with atorvastatin and LFTs monitored regularly.   7. Hypokalemia  Patient's potassium monitored regularly.  No concerns at this time.  8. BMI 32.0-32.9,adult  No significant weight gain or loss.  9. Vitamin D deficiency  Patient taking supplemental vitamin D.    10.    Primary osteoarthritis of the left knee          Patient had a left knee replacement on 10/12/16.  New complaints: None today.  Social history:    Review of Systems  Constitutional: Negative for activity change and appetite change.  Respiratory: Negative for cough and shortness of breath.   Cardiovascular:  Negative for chest pain and palpitations.  Musculoskeletal: Negative for myalgias.  Neurological: Negative for dizziness, light-headedness and headaches.  All other systems reviewed and are negative.      Objective:   Physical Exam  Constitutional: She is oriented to person, place, and time. She appears well-developed and well-nourished. No distress.  HENT:  Head: Normocephalic.  Right Ear: Hearing, tympanic membrane,  external ear and ear canal normal.  Left Ear: Hearing, tympanic membrane, external ear and ear canal normal.  Nose: Nose normal.  Mouth/Throat: Uvula is midline and oropharynx is clear and moist.  Eyes: Pupils are equal, round, and reactive to light. Conjunctivae and EOM are normal.  Neck: Normal range of motion and full passive range of motion without pain. Neck supple. No JVD present. Carotid bruit is not present. No thyroid mass and no thyromegaly present.  Cardiovascular: Normal rate, regular rhythm, normal heart sounds and intact distal pulses.   No murmur heard. Pulmonary/Chest: Effort normal and breath sounds normal. No respiratory distress. She has no wheezes. Right breast exhibits no inverted nipple, no mass, no nipple discharge, no skin change and no tenderness. Left breast exhibits no inverted nipple, no mass, no nipple discharge, no skin change and no tenderness.  Abdominal: Soft. Bowel sounds are normal. She exhibits no distension and no mass. There is no tenderness.  Genitourinary: Vagina normal and uterus normal. No breast swelling, tenderness, discharge or bleeding. No vaginal discharge found.  Genitourinary Comments: bimanual exam-No adnexal masses or tenderness. Cervical stenosis  Musculoskeletal: Normal range of motion. She exhibits no edema.  Lymphadenopathy:    She has no cervical adenopathy.  Neurological: She is alert and oriented to person, place, and time. She has normal reflexes.  Skin: Skin is warm and dry.  Psychiatric: She has a normal mood and affect. Her behavior is normal. Judgment and thought content normal.   BP (!) 143/80   Pulse 67   Temp (!) 96.5 F (35.8 C) (Oral)   Ht '5\' 2"'$  (1.575 m)   Wt 195 lb (88.5 kg)   BMI 35.67 kg/m      Assessment & Plan:  1. Annual physical exam - Urinalysis, Complete - CBC with Differential/Platelet - Thyroid Panel With TSH - Pap IG (Image Guided) - CBC with Differential/Platelet  2. Essential hypertension Low  sodium diet - olmesartan-hydrochlorothiazide (BENICAR HCT) 40-12.5 MG tablet; Take 1 tablet by mouth daily.  Dispense: 30 tablet; Refill: 3 - CMP14+EGFR  3. Hypothyroidism, unspecified type - levothyroxine (SYNTHROID, LEVOTHROID) 50 MCG tablet; Take 1 tablet (50 mcg total) by mouth daily.  Dispense: 90 tablet; Refill: 1  4. Controlled type 2 diabetes mellitus without complication, without long-term current use of insulin ([facility]) Continue to watch carbsin diet - Bayer DCA Hb A1c Waived  5. Osteopenia, unspecified location Weight bearing exercises  6. Hypokalemia  7. BMI 32.0-32.9,adult Discussed diet and exercise for person with BMI >25 Will recheck weight in 3-6 months  8. Vitamin D deficiency  9. Mixed hyperlipidemia Low fat diet encouraged - atorvastatin (LIPITOR) 40 MG tablet; Take 1 tablet (40 mg total) by mouth daily.  Dispense: 90 tablet; Refill: 1 - Lipid panel    Labs pending Health maintenance reviewed Diet and exercise encouraged Continue all meds Follow up  In 6 months   [place], FNP

## 2016-11-23 ENCOUNTER — Ambulatory Visit: Payer: Medicare Other | Admitting: Physical Therapy

## 2016-11-23 ENCOUNTER — Encounter: Payer: Self-pay | Admitting: Physical Therapy

## 2016-11-23 DIAGNOSIS — M25562 Pain in left knee: Secondary | ICD-10-CM

## 2016-11-23 DIAGNOSIS — R600 Localized edema: Secondary | ICD-10-CM | POA: Diagnosis not present

## 2016-11-23 DIAGNOSIS — M25662 Stiffness of left knee, not elsewhere classified: Secondary | ICD-10-CM | POA: Diagnosis not present

## 2016-11-23 DIAGNOSIS — R262 Difficulty in walking, not elsewhere classified: Secondary | ICD-10-CM | POA: Diagnosis not present

## 2016-11-23 LAB — CBC WITH DIFFERENTIAL/PLATELET
BASOS ABS: 0 10*3/uL (ref 0.0–0.2)
BASOS: 1 %
EOS (ABSOLUTE): 0.2 10*3/uL (ref 0.0–0.4)
Eos: 3 %
Hematocrit: 36 % (ref 34.0–46.6)
Hemoglobin: 11.9 g/dL (ref 11.1–15.9)
Immature Grans (Abs): 0 10*3/uL (ref 0.0–0.1)
Immature Granulocytes: 0 %
Lymphocytes Absolute: 1.6 10*3/uL (ref 0.7–3.1)
Lymphs: 25 %
MCH: 29 pg (ref 26.6–33.0)
MCHC: 33.1 g/dL (ref 31.5–35.7)
MCV: 88 fL (ref 79–97)
MONOS ABS: 0.3 10*3/uL (ref 0.1–0.9)
Monocytes: 5 %
NEUTROS ABS: 4.3 10*3/uL (ref 1.4–7.0)
Neutrophils: 66 %
Platelets: 313 10*3/uL (ref 150–379)
RBC: 4.11 x10E6/uL (ref 3.77–5.28)
RDW: 15.2 % (ref 12.3–15.4)
WBC: 6.5 10*3/uL (ref 3.4–10.8)

## 2016-11-23 LAB — CMP14+EGFR
ALT: 16 IU/L (ref 0–32)
AST: 17 IU/L (ref 0–40)
Albumin/Globulin Ratio: 1.9 (ref 1.2–2.2)
Albumin: 4.6 g/dL (ref 3.6–4.8)
Alkaline Phosphatase: 75 IU/L (ref 39–117)
BILIRUBIN TOTAL: 0.8 mg/dL (ref 0.0–1.2)
BUN/Creatinine Ratio: 21 (ref 12–28)
BUN: 15 mg/dL (ref 8–27)
CALCIUM: 9.5 mg/dL (ref 8.7–10.3)
CO2: 27 mmol/L (ref 20–29)
Chloride: 101 mmol/L (ref 96–106)
Creatinine, Ser: 0.73 mg/dL (ref 0.57–1.00)
GFR calc non Af Amer: 85 mL/min/{1.73_m2} (ref >59)
GFR, EST AFRICAN AMERICAN: 98 mL/min/{1.73_m2} (ref >59)
GLUCOSE: 95 mg/dL (ref 65–99)
Globulin, Total: 2.4 g/dL (ref 1.5–4.5)
Potassium: 4.4 mmol/L (ref 3.5–5.2)
Sodium: 143 mmol/L (ref 134–144)
TOTAL PROTEIN: 7 g/dL (ref 6.0–8.5)

## 2016-11-23 LAB — LIPID PANEL
Chol/HDL Ratio: 3.1 ratio (ref 0.0–4.4)
Cholesterol, Total: 132 mg/dL (ref 100–199)
HDL: 42 mg/dL (ref >39)
LDL Calculated: 54 mg/dL (ref 0–99)
Triglycerides: 180 mg/dL — ABNORMAL HIGH (ref 0–149)
VLDL CHOLESTEROL CAL: 36 mg/dL (ref 5–40)

## 2016-11-23 LAB — THYROID PANEL WITH TSH
Free Thyroxine Index: 1.8 (ref 1.2–4.9)
T3 Uptake Ratio: 26 % (ref 24–39)
T4 TOTAL: 7 ug/dL (ref 4.5–12.0)
TSH: 1.13 u[IU]/mL (ref 0.450–4.500)

## 2016-11-23 LAB — PAP IG (IMAGE GUIDED): PAP Smear Comment: 0

## 2016-11-23 NOTE — Therapy (Signed)
[facility] [place], [place], [address] Phone: [phone]   Fax:  [phone]  Physical Therapy Treatment  Patient Details  Name: Mckenzie Leonard MRN: [id number] Date of Birth: [birth year] Referring Provider: Arther Abbott, MD  Encounter Date: 11/23/2016      PT End of Session - 11/23/16 0948    Visit Number 7   Number of Visits 16   Date for PT Re-Evaluation 12/30/16   Authorization Type KX modifier after visist 15 and G-code every 10th visit   PT Start Time 0947   PT Stop Time 1037   PT Time Calculation (min) 50 min   Activity Tolerance Patient tolerated treatment well   Behavior During Therapy [facility] for tasks assessed/performed      Past Medical History:  Diagnosis Date  . Arthritis   . Diabetes mellitus without complication ([place])   . High cholesterol   . Hypertension   . Hypothyroidism   . PONV (postoperative nausea and vomiting)   . Thyroid disease     Past Surgical History:  Procedure Laterality Date  . CARPAL TUNNEL RELEASE     Right  . CLOSED REDUCTION MANDIBLE  02/20/2012   Procedure: CLOSED REDUCTION MANDIBULAR;  Surgeon: Isac Caddy, DDS;  Location: [place];  Service: Oral Surgery;  Laterality: Right;  Closed reduction zygonach arch fx  . KNEE CARTILAGE SURGERY Left   . ORIF FACIAL FRACTURE  02/20/2012   Procedure: OPEN REDUCTION INTERNAL FIXATION (ORIF) MULTIPLE FACIAL FRACTURES;  Surgeon: Isac Caddy, DDS;  Location: [place];  Service: Oral Surgery;  Laterality: Right;  ORIF Rt ZMC (zygomaxillary)  . TOTAL KNEE ARTHROPLASTY Left 10/12/2016   Procedure: TOTAL KNEE ARTHROPLASTY;  Surgeon: Carole Civil, MD;  Location[facility];  Service: Orthopedics;  Laterality: Left;    There were no vitals filed for this visit.      Subjective Assessment - 11/23/16 0947    Subjective Reports that her knee is doing good but states that she has really been working on her knee.   Limitations  House hold activities;Walking;Standing   How long can you sit comfortably? 30 minutes   How long can you stand comfortably? 15 minutes   How long can you walk comfortably? 1 hour   Patient Stated Goals Walk Normally with no cane   Currently in Pain? Yes   Pain Score 1    Pain Location Knee   Pain Orientation Left   Pain Descriptors / Indicators Discomfort   Pain Type Surgical pain   Pain Onset More than a month ago            [facility] PT Assessment - 11/23/16 0001      Assessment   Medical Diagnosis Left TKR   Onset Date/Surgical Date 10/12/16   Hand Dominance Right   Next MD Visit 12/12/2016   Prior Therapy [facility] 6 visits, outpt PT 12 years ago after arthoscopic knee surgery     Precautions   Precautions None     Restrictions   Weight Bearing Restrictions No     ROM / Strength   AROM / PROM / Strength AROM     AROM   Overall AROM  Deficits   AROM Assessment Site Knee   Right/Left Knee Left   Left Knee Extension -3   Left Knee Flexion 115                     [facility] Adult PT Treatment/Exercise - 11/23/16  0001      Knee/Hip Exercises: Aerobic   Nustep L4, seat 8 x10 min     Knee/Hip Exercises: Machines for Strengthening   Cybex Knee Extension 10# 3x10 reps   Cybex Knee Flexion 30# 3x10 reps     Knee/Hip Exercises: Standing   Forward Lunges Left;15 reps;3 seconds   Terminal Knee Extension Limitations x20 reps Pink XTS LLE     Knee/Hip Exercises: Supine   Straight Leg Raises Strengthening;Left;2 sets;10 reps     Knee/Hip Exercises: Sidelying   Hip ABduction Strengthening;Left;15 reps     Modalities   Modalities Electrical Stimulation;Vasopneumatic     Electrical Stimulation   Electrical Stimulation Location L knee   Electrical Stimulation Action IFC   Electrical Stimulation Parameters 1-10 hz x15 min   Electrical Stimulation Goals Pain;Edema     Vasopneumatic   Number Minutes Vasopneumatic  15 minutes   Vasopnuematic Location  Knee    Vasopneumatic Pressure Medium   Vasopneumatic Temperature  34     Manual Therapy   Manual Therapy Passive ROM;Soft tissue mobilization   Soft tissue mobilization L patella mobilizations in all directions, L incision mobilizations to improve mobilty   Passive ROM PROM of L knee into fllexion, extension with holds at end range                  PT Short Term Goals - 11/07/16 1051      PT SHORT TERM GOAL #1   Title Pt will be indepedent with her HEP with initial exercises.    Time 3   Period Weeks   Status Achieved           PT Long Term Goals - 11/23/16 1026      PT LONG TERM GOAL #1   Title Pt will improve her FOTO score from 47% limitiation to </= 35% limitation.   Time 8   Period Weeks   Status On-going     PT LONG TERM GOAL #2   Title Pt will be able to amb >/= 1000 feet with no assistive device.    Time 8   Period Weeks   Status On-going     PT LONG TERM GOAL #3   Title Pt will improve her left knee flexion to >/= 120 degrees in order to improve functional mobility.    Baseline AROM: 80 degrees 11/01/16   Time 8   Period Weeks   Status On-going  AROM L knee 115 deg flexion 11/23/2016     PT LONG TERM GOAL #4   Title Pt will improve her left knee extension to </= 5 degrees in order to improve functional mobility.    Baseline 14 degrees flexed on 11/01/16   Time 8   Period Weeks   Status Achieved  AROM L knee ext measured at 3 deg from neutral 11/23/2016               Plan - 11/23/16 1025    Clinical Impression Statement Patient presented in clinic today and demonstrated great improvements in ROM and in strength today. Patient progressed to machine knee strengthening without complaint. Minimal L extensor lag noted with SLR today. AROM L knee measured as 3-115 deg today thus meeting knee extension goal. Good L patella mobility in all directions assessed today with superior knee incision tightness assessed today as well. Normal modalities response  noted following removal of the modalities.   Rehab Potential Excellent   PT Frequency 2x / week   PT  Duration 8 weeks   PT Treatment/Interventions ADLs/Self Care Home Management;Cryotherapy;Electrical Stimulation;Iontophoresis 4mg /ml Dexamethasone;Ultrasound;Passive range of motion;Therapeutic exercise;Balance training;Therapeutic activities;Functional mobility training;Stair training;Gait training;Patient/family education;Manual techniques;Vasopneumatic Device;Taping   PT Next Visit Plan Progress to stationary bike next treatment as well as progress strengthening per MPT POC.   PT Home Exercise Plan hamstirng stretches, heel slides, prone knee hangs, quad sets   Consulted and Agree with Plan of Care Patient      Patient will benefit from skilled therapeutic intervention in order to improve the following deficits and impairments:  Abnormal gait, Pain, Impaired flexibility, Decreased range of motion, Difficulty walking, Decreased mobility, Decreased balance, Decreased strength, Decreased activity tolerance  Visit Diagnosis: Stiffness of left knee, not elsewhere classified  Acute pain of left knee  Difficulty in walking, not elsewhere classified  Localized edema     Problem List Patient Active Problem List   Diagnosis Date Noted  . Primary osteoarthritis of left knee   . Osteopenia 04/30/2014  . BMI 32.0-32.9,adult 09/16/2013  . Vitamin D deficiency 09/16/2013  . Hypokalemia 02/18/2012  . HTN (hypertension) 02/18/2012  . Type 2 diabetes mellitus, controlled ([place]) 02/18/2012  . Hyperlipidemia 02/18/2012  . Hypothyroidism 02/18/2012    Wynelle Fanny, PTA 11/23/2016, 10:40 AM  [facility] [address] [place], [place], [address] Phone: [phone]   Fax:  [phone]  Name: Mckenzie Leonard MRN: [id number] Date of Birth: [birth year]

## 2016-11-30 ENCOUNTER — Encounter: Payer: Self-pay | Admitting: Physical Therapy

## 2016-11-30 ENCOUNTER — Ambulatory Visit: Payer: Medicare Other | Attending: Orthopedic Surgery | Admitting: Physical Therapy

## 2016-11-30 DIAGNOSIS — R600 Localized edema: Secondary | ICD-10-CM | POA: Insufficient documentation

## 2016-11-30 DIAGNOSIS — M25662 Stiffness of left knee, not elsewhere classified: Secondary | ICD-10-CM | POA: Insufficient documentation

## 2016-11-30 DIAGNOSIS — M25562 Pain in left knee: Secondary | ICD-10-CM | POA: Diagnosis not present

## 2016-11-30 DIAGNOSIS — R262 Difficulty in walking, not elsewhere classified: Secondary | ICD-10-CM | POA: Diagnosis not present

## 2016-11-30 NOTE — Therapy (Signed)
[facility] [place], [place], [address] Phone: [phone]   Fax:  [phone]  Physical Therapy Treatment  Patient Details  Name: Mckenzie Leonard MRN: [id number] Date of Birth: 07-21-1950 Referring Provider: Arther Abbott, MD  Encounter Date: 11/30/2016      PT End of Session - 11/30/16 0938    Visit Number 8   Number of Visits 16   Date for PT Re-Evaluation 12/30/16   Authorization Type KX modifier after visist 15 and G-code every 10th visit   PT Start Time 0936   PT Stop Time 1031   PT Time Calculation (min) 55 min   Activity Tolerance Patient tolerated treatment well   Behavior During Therapy [facility] for tasks assessed/performed      Past Medical History:  Diagnosis Date  . Arthritis   . Diabetes mellitus without complication ([place])   . High cholesterol   . Hypertension   . Hypothyroidism   . PONV (postoperative nausea and vomiting)   . Thyroid disease     Past Surgical History:  Procedure Laterality Date  . CARPAL TUNNEL RELEASE     Right  . CLOSED REDUCTION MANDIBLE  02/20/2012   Procedure: CLOSED REDUCTION MANDIBULAR;  Surgeon: Isac Caddy, DDS;  Location: [place];  Service: Oral Surgery;  Laterality: Right;  Closed reduction zygonach arch fx  . KNEE CARTILAGE SURGERY Left   . ORIF FACIAL FRACTURE  02/20/2012   Procedure: OPEN REDUCTION INTERNAL FIXATION (ORIF) MULTIPLE FACIAL FRACTURES;  Surgeon: Isac Caddy, DDS;  Location: [place];  Service: Oral Surgery;  Laterality: Right;  ORIF Rt ZMC (zygomaxillary)  . TOTAL KNEE ARTHROPLASTY Left 10/12/2016   Procedure: TOTAL KNEE ARTHROPLASTY;  Surgeon: Carole Civil, MD;  Location: [facility];  Service: Orthopedics;  Laterality: Left;    There were no vitals filed for this visit.      Subjective Assessment - 11/30/16 0936    Subjective Reports that she may have overdone it yesterday with walking and yard work.    Limitations House hold  activities;Walking;Standing   How long can you sit comfortably? 2 hours   How long can you stand comfortably? No limitation   How long can you walk comfortably? 1.5 hours   Patient Stated Goals Walk Normally with no cane   Currently in Pain? No/denies            [facility] PT Assessment - 11/30/16 0001      Assessment   Medical Diagnosis Left TKR   Onset Date/Surgical Date 10/12/16   Hand Dominance Right   Next MD Visit 12/12/2016   Prior Therapy HHPT 6 visits, outpt PT 12 years ago after arthoscopic knee surgery     Precautions   Precautions None     Restrictions   Weight Bearing Restrictions No     ROM / Strength   AROM / PROM / Strength AROM     AROM   Overall AROM  Deficits;Within functional limits for tasks performed   AROM Assessment Site Knee   Right/Left Knee Left   Left Knee Extension -3   Left Knee Flexion 120                     [facility] Adult PT Treatment/Exercise - 11/30/16 0001      Knee/Hip Exercises: Aerobic   Stationary Bike L1, seat 5 x10 min     Knee/Hip Exercises: Machines for Strengthening   Cybex Knee Extension 10# 3x10 reps  Cybex Knee Flexion 30# 3x10 reps   Cybex Leg Press 2 pl, seat 6, x20 reps     Knee/Hip Exercises: Standing   Forward Lunges Left;15 reps;3 seconds   Terminal Knee Extension Limitations x20 reps Pink XTS LLE     Knee/Hip Exercises: Supine   Straight Leg Raises Strengthening;Left;2 sets;10 reps   Straight Leg Raise with External Rotation Strengthening;Left;1 set;10 reps     Knee/Hip Exercises: Sidelying   Hip ABduction Strengthening;Left;20 reps     Modalities   Modalities Electrical Stimulation;Vasopneumatic     Electrical Stimulation   Electrical Stimulation Location L knee   Electrical Stimulation Action IFC   Electrical Stimulation Parameters 1-10 hz x15 min   Electrical Stimulation Goals Edema     Vasopneumatic   Number Minutes Vasopneumatic  15 minutes   Vasopnuematic Location  Knee    Vasopneumatic Pressure Medium   Vasopneumatic Temperature  34     Manual Therapy   Manual Therapy Passive ROM   Passive ROM PROM of L knee into fllexion, extension with holds at end range                  PT Short Term Goals - 11/07/16 1051      PT SHORT TERM GOAL #1   Title Pt will be indepedent with her HEP with initial exercises.    Time 3   Period Weeks   Status Achieved           PT Long Term Goals - 11/30/16 9371      PT LONG TERM GOAL #1   Title Pt will improve her FOTO score from 47% limitiation to </= 35% limitation.   Time 8   Period Weeks   Status Achieved  Current limitation 16% with FOTO as of 11/30/2016     PT LONG TERM GOAL #2   Title Pt will be able to amb >/= 1000 feet with no assistive device.    Time 8   Period Weeks   Status Achieved  No longer uses AD for 2 weeks as of 11/30/2016     PT LONG TERM GOAL #3   Title Pt will improve her left knee flexion to >/= 120 degrees in order to improve functional mobility.    Baseline AROM: 80 degrees 11/01/16   Time 8   Period Weeks   Status Achieved  AROM L knee 120 deg 11/30/2016     PT LONG TERM GOAL #4   Title Pt will improve her left knee extension to </= 5 degrees in order to improve functional mobility.    Baseline 14 degrees flexed on 11/01/16   Time 8   Period Weeks   Status Achieved  AROM L knee ext measured at 3 deg from neutral 11/23/2016               Plan - 11/30/16 1020    Clinical Impression Statement Patient has progressed very well in regards to function and ROM. Patient no longer using AD and reports being able to stand, walk and sit for long duration. Patient able to complete all machine strengthening without limitation or report of pain. L quad fatigue and weakness still noted with supine strengthening as extensor lag presents with fatigue. AROM of L knee measured as 3-120 deg. Normal modalities response noted following removal of the modalities.    Rehab Potential  Excellent   PT Frequency 2x / week   PT Duration 8 weeks   PT Treatment/Interventions ADLs/Self Care Home  Management;Cryotherapy;Electrical Stimulation;Iontophoresis 4mg /ml Dexamethasone;Ultrasound;Passive range of motion;Therapeutic exercise;Balance training;Therapeutic activities;Functional mobility training;Stair training;Gait training;Patient/family education;Manual techniques;Vasopneumatic Device;Taping   PT Next Visit Plan Progress to stationary bike next treatment as well as progress strengthening per MPT POC.   PT Home Exercise Plan hamstirng stretches, heel slides, prone knee hangs, quad sets   Consulted and Agree with Plan of Care Patient      Patient will benefit from skilled therapeutic intervention in order to improve the following deficits and impairments:  Abnormal gait, Pain, Impaired flexibility, Decreased range of motion, Difficulty walking, Decreased mobility, Decreased balance, Decreased strength, Decreased activity tolerance  Visit Diagnosis: Stiffness of left knee, not elsewhere classified  Acute pain of left knee  Difficulty in walking, not elsewhere classified  Localized edema     Problem List Patient Active Problem List   Diagnosis Date Noted  . Primary osteoarthritis of left knee   . Osteopenia 04/30/2014  . BMI 32.0-32.9,adult 09/16/2013  . Vitamin D deficiency 09/16/2013  . Hypokalemia 02/18/2012  . HTN (hypertension) 02/18/2012  . Type 2 diabetes mellitus, controlled ([place]) 02/18/2012  . Hyperlipidemia 02/18/2012  . Hypothyroidism 02/18/2012    Wynelle Fanny, PTA 11/30/2016, 10:36 AM  [facility] [address] [place], [place], [address] Phone: [phone]   Fax:  [phone]  Name: Mckenzie Leonard MRN: [id number] Date of Birth: 09/12/1950

## 2016-12-06 ENCOUNTER — Other Ambulatory Visit: Payer: Self-pay | Admitting: Nurse Practitioner

## 2016-12-07 ENCOUNTER — Encounter: Payer: Self-pay | Admitting: Physical Therapy

## 2016-12-07 ENCOUNTER — Ambulatory Visit: Payer: Medicare Other | Admitting: Physical Therapy

## 2016-12-07 DIAGNOSIS — M25662 Stiffness of left knee, not elsewhere classified: Secondary | ICD-10-CM | POA: Diagnosis not present

## 2016-12-07 DIAGNOSIS — R600 Localized edema: Secondary | ICD-10-CM

## 2016-12-07 DIAGNOSIS — M25562 Pain in left knee: Secondary | ICD-10-CM | POA: Diagnosis not present

## 2016-12-07 DIAGNOSIS — R262 Difficulty in walking, not elsewhere classified: Secondary | ICD-10-CM

## 2016-12-07 NOTE — Patient Instructions (Addendum)
Terminal Knee Extension (Standing)    Facing anchor with left knee slightly bent and tubing just above knee, gently pull knee back straight. Do not overextend knee. Repeat _10___ times per set. Do _2-3___ sets per session. Do _1-2___ sessions per day.   Strengthening: Hip Abductor - Resisted    With band looped around both legs above knees, push thighs apart. Repeat _30___ times per set. Do __1-2__ sets per session. Do __1-2__ sessions per day.

## 2016-12-07 NOTE — Therapy (Addendum)
[facility] [place], [place], [address] Phone: [phone]   Fax:  [phone]  Physical Therapy Treatment discharge  Patient Details  Name: Mckenzie Leonard MRN: [id number] Date of Birth: [birth year] Referring Provider: Arther Abbott, MD  Encounter Date: 12/07/2016      PT End of Session - 12/07/16 1018    Visit Number 9   Number of Visits 16   Date for PT Re-Evaluation 12/30/16   Authorization Type KX modifier after visist 15 and G-code every 10th visit   PT Start Time 0945   PT Stop Time 1040   PT Time Calculation (min) 55 min   Activity Tolerance Patient tolerated treatment well   Behavior During Therapy [facility] for tasks assessed/performed      Past Medical History:  Diagnosis Date  . Arthritis   . Diabetes mellitus without complication ([place])   . High cholesterol   . Hypertension   . Hypothyroidism   . PONV (postoperative nausea and vomiting)   . Thyroid disease     Past Surgical History:  Procedure Laterality Date  . CARPAL TUNNEL RELEASE     Right  . CLOSED REDUCTION MANDIBLE  02/20/2012   Procedure: CLOSED REDUCTION MANDIBULAR;  Surgeon: Isac Caddy, DDS;  Location: [place];  Service: Oral Surgery;  Laterality: Right;  Closed reduction zygonach arch fx  . KNEE CARTILAGE SURGERY Left   . ORIF FACIAL FRACTURE  02/20/2012   Procedure: OPEN REDUCTION INTERNAL FIXATION (ORIF) MULTIPLE FACIAL FRACTURES;  Surgeon: Isac Caddy, DDS;  Location: [place];  Service: Oral Surgery;  Laterality: Right;  ORIF Rt ZMC (zygomaxillary)  . TOTAL KNEE ARTHROPLASTY Left 10/12/2016   Procedure: TOTAL KNEE ARTHROPLASTY;  Surgeon: Carole Civil, MD;  Location: [facility];  Service: Orthopedics;  Laterality: Left;    There were no vitals filed for this visit.      Subjective Assessment - 12/07/16 0951    Subjective Patient reported doing well overall and no complaints today   Limitations House hold  activities;Walking;Standing   How long can you sit comfortably? 2 hours   How long can you stand comfortably? No limitation   How long can you walk comfortably? 1.5 hours   Patient Stated Goals Walk Normally with no cane   Currently in Pain? No/denies                         [facility] Adult PT Treatment/Exercise - 12/07/16 0001      Knee/Hip Exercises: Aerobic   Nustep L4, seat 8 x10 min     Knee/Hip Exercises: Machines for Strengthening   Cybex Knee Extension 10# x fatigue   Cybex Knee Flexion 30# x fatigue     Knee/Hip Exercises: Standing   Terminal Knee Extension Limitations x20 reps red t-band LLE     Knee/Hip Exercises: Supine   Straight Leg Raises Strengthening;Left;2 sets;10 reps   Straight Leg Raise with External Rotation Strengthening;Left;1 set;10 reps   Other Supine Knee/Hip Exercises hip abd with red t-band x30     Knee/Hip Exercises: Sidelying   Hip ABduction Strengthening;Left;20 reps   Clams x20     Electrical Stimulation   Electrical Stimulation Location L knee   Electrical Stimulation Action IFC   Electrical Stimulation Parameters 1-_0  x50mn   Electrical Stimulation Goals Edema     Vasopneumatic   Number Minutes Vasopneumatic  15 minutes   Vasopnuematic Location  Knee   Vasopneumatic Pressure Medium  PT Education - 12/07/16 1021    Education provided Yes   Education Details HEP   Person(s) Educated Patient   Methods Explanation;Demonstration;Handout   Comprehension Verbalized understanding;Returned demonstration          PT Short Term Goals - 11/07/16 1051      PT SHORT TERM GOAL #1   Title Pt will be indepedent with her HEP with initial exercises.    Time 3   Period Weeks   Status Achieved           PT Long Term Goals - 11/30/16 0623      PT LONG TERM GOAL #1   Title Pt will improve her FOTO score from 47% limitiation to </= 35% limitation.   Time 8   Period Weeks   Status Achieved   Current limitation 16% with FOTO as of 11/30/2016     PT LONG TERM GOAL #2   Title Pt will be able to amb >/= 1000 feet with no assistive device.    Time 8   Period Weeks   Status Achieved  No longer uses AD for 2 weeks as of 11/30/2016     PT LONG TERM GOAL #3   Title Pt will improve her left knee flexion to >/= 120 degrees in order to improve functional mobility.    Baseline AROM: 80 degrees 11/01/16   Time 8   Period Weeks   Status Achieved  AROM L knee 120 deg 11/30/2016     PT LONG TERM GOAL #4   Title Pt will improve her left knee extension to </= 5 degrees in order to improve functional mobility.    Baseline 14 degrees flexed on 11/01/16   Time 8   Period Weeks   Status Achieved  AROM L knee ext measured at 3 deg from neutral 11/23/2016               Plan - 12/07/16 1022    Clinical Impression Statement Patient has met all current goals. FOTO 11% limitation (initial 47%). HEP given with red t-band. Patient independent with all ADL's and activities. Patient has no pain or discomfort and full ROM in left knee. Patient going to MD monday and is ready to DC to HEP.   Rehab Potential Excellent   PT Frequency 2x / week   PT Duration 8 weeks   PT Treatment/Interventions ADLs/Self Care Home Management;Cryotherapy;Electrical Stimulation;Iontophoresis 30m/ml Dexamethasone;Ultrasound;Passive range of motion;Therapeutic exercise;Balance training;Therapeutic activities;Functional mobility training;Stair training;Gait training;Patient/family education;Manual techniques;Vasopneumatic Device;Taping   PT Next Visit Plan on hold until MD appt monday do determin final DC   Consulted and Agree with Plan of Care Patient      Patient will benefit from skilled therapeutic intervention in order to improve the following deficits and impairments:  Abnormal gait, Pain, Impaired flexibility, Decreased range of motion, Difficulty walking, Decreased mobility, Decreased balance, Decreased strength,  Decreased activity tolerance  Visit Diagnosis: Stiffness of left knee, not elsewhere classified  Acute pain of left knee  Difficulty in walking, not elsewhere classified  Localized edema  PHYSICAL THERAPY DISCHARGE SUMMARY  Visits from Start of Care: 9  Current functional level related to goals / functional outcomes: See above   Remaining deficits: See above   Education / Equipment: HEP  Plan: Patient agrees to discharge.  Patient goals were met. Patient is being discharged due to meeting the stated rehab goals.  ?????        Problem List Patient Active Problem List   Diagnosis Date  Noted  . Primary osteoarthritis of left knee   . Osteopenia 04/30/2014  . BMI 32.0-32.9,adult 09/16/2013  . Vitamin D deficiency 09/16/2013  . Hypokalemia 02/18/2012  . HTN (hypertension) 02/18/2012  . Type 2 diabetes mellitus, controlled ([place]) 02/18/2012  . Hyperlipidemia 02/18/2012  . Hypothyroidism 02/18/2012   Ladean Raya, PTA 12/07/16 11:07 AM Mali Applegate MPT [facility] [place], [place], [address] Phone: [phone]   Fax:  [phone]  Name: Mckenzie Leonard MRN: [id number] Date of Birth: 1950-03-10  Kearney Hard, PT 04/10/18 9:51 AM

## 2016-12-12 ENCOUNTER — Ambulatory Visit (INDEPENDENT_AMBULATORY_CARE_PROVIDER_SITE_OTHER): Payer: Self-pay | Admitting: Orthopedic Surgery

## 2016-12-12 DIAGNOSIS — Z96652 Presence of left artificial knee joint: Secondary | ICD-10-CM

## 2016-12-12 NOTE — Progress Notes (Signed)
POST OP VISIT   Patient ID: Mckenzie Leonard, female   DOB: 18-Mar-1950, 66 y.o.   MRN: [id number]  Chief Complaint  Patient presents with  . Follow-up    6 week recheck on left knee, TKA, DOS 10-12-16.    Encounter Diagnosis  Name Primary?  . S/P total knee replacement, left 10/12/2016    2 months after total knee patient has regained full extension and 120 of knee flexion. She's not requiring any medications at this time her therapy went well  Her incision healed nicely. She has some mild edema in the lower extremity at the ankle but her Homans sign is negative and there no signs of DVT  Recommend 4 month POST OP MONTH 6  follow-up

## 2016-12-30 ENCOUNTER — Other Ambulatory Visit: Payer: Self-pay | Admitting: Nurse Practitioner

## 2017-04-19 ENCOUNTER — Ambulatory Visit: Payer: Medicare Other | Admitting: Orthopedic Surgery

## 2017-04-19 ENCOUNTER — Encounter: Payer: Self-pay | Admitting: Orthopedic Surgery

## 2017-04-19 VITALS — BP 147/78 | HR 72 | Ht 62.0 in | Wt 194.0 lb

## 2017-04-19 DIAGNOSIS — Z96652 Presence of left artificial knee joint: Secondary | ICD-10-CM

## 2017-04-19 NOTE — Progress Notes (Signed)
Progress Note   Patient ID: Mckenzie Leonard, female   DOB: 28-Oct-1950, 67 y.o.   MRN: [id number]  Chief Complaint  Patient presents with  . Knee Pain    left    67 years old status post left total knee 6 months ago doing well no complaints     Review of Systems  Constitutional: Negative for chills, fever and weight loss.   Current Meds  Medication Sig  . atorvastatin (LIPITOR) 40 MG tablet Take 1 tablet (40 mg total) by mouth daily.  . Blood Glucose Monitoring Suppl (BLOOD GLUCOSE METER KIT AND SUPPLIES) KIT Dispense based on patient and insurance preference. Use to check BG once daily.  ICD 10 code = E11.9  . calcium-vitamin D (OSCAL WITH D) 500-200 MG-UNIT tablet Take 1 tablet by mouth 2 (two) times daily.  . Cholecalciferol (VITAMIN D) 2000 units CAPS Take 6,000 Units by mouth daily.   . IBU 800 MG tablet Take 1 Tablet by mouth every 8 hours as needed  . levothyroxine (SYNTHROID, LEVOTHROID) 50 MCG tablet Take 1 tablet (50 mcg total) by mouth daily.  . magnesium gluconate (MAGONATE) 500 MG tablet Take 500 mg by mouth daily.  . Multiple Vitamin (MULTIVITAMIN WITH MINERALS) TABS tablet Take 1 tablet by mouth daily.  Marland Kitchen olmesartan-hydrochlorothiazide (BENICAR HCT) 40-12.5 MG tablet Take 1 tablet by mouth daily.  Marland Kitchen olmesartan-hydrochlorothiazide (BENICAR HCT) 40-12.5 MG tablet Take 1 Tablet by mouth once daily  . ONE TOUCH ULTRA TEST test strip USE TO check sugar ONCE daily OR PER directions  . TURMERIC PO Take 2 tablets by mouth 2 (two) times daily.     Allergies  Allergen Reactions  . Sulfa Antibiotics Anaphylaxis  . Statins Other (See Comments)    One of them caused muscle pain  . Tetanus Toxoids     Reports flu like symptoms and swelling at injection site only     BP (!) 147/78   Pulse 72   Ht '5\' 2"'$  (1.575 m)   Wt 194 lb (88 kg)   BMI 35.48 kg/m   Physical Exam  Constitutional: She is oriented to person, place, and time. She appears well-developed and  well-nourished.  Musculoskeletal:       Legs: Neurological: She is alert and oriented to person, place, and time.  Psychiatric: She has a normal mood and affect. Judgment normal.  Vitals reviewed.    Medical decision-making Encounter Diagnosis  Name Primary?  . S/P total knee replacement, left 10/12/2016 Yes    Assessment and plan.  Patient is doing well after her left total knee replacement she has no complaints she will follow-up in 6 months for 1 year anniversary x-ray  Arther Abbott, MD 04/19/2017 9:20 AM

## 2017-05-23 ENCOUNTER — Encounter: Payer: Self-pay | Admitting: Nurse Practitioner

## 2017-05-23 ENCOUNTER — Ambulatory Visit (INDEPENDENT_AMBULATORY_CARE_PROVIDER_SITE_OTHER): Payer: Medicare Other | Admitting: Nurse Practitioner

## 2017-05-23 VITALS — BP 125/75 | HR 77 | Temp 97.8°F | Ht 62.0 in | Wt 194.0 lb

## 2017-05-23 DIAGNOSIS — E876 Hypokalemia: Secondary | ICD-10-CM

## 2017-05-23 DIAGNOSIS — E039 Hypothyroidism, unspecified: Secondary | ICD-10-CM

## 2017-05-23 DIAGNOSIS — I10 Essential (primary) hypertension: Secondary | ICD-10-CM

## 2017-05-23 DIAGNOSIS — E119 Type 2 diabetes mellitus without complications: Secondary | ICD-10-CM | POA: Diagnosis not present

## 2017-05-23 DIAGNOSIS — E559 Vitamin D deficiency, unspecified: Secondary | ICD-10-CM

## 2017-05-23 DIAGNOSIS — Z6832 Body mass index (BMI) 32.0-32.9, adult: Secondary | ICD-10-CM | POA: Diagnosis not present

## 2017-05-23 DIAGNOSIS — M8580 Other specified disorders of bone density and structure, unspecified site: Secondary | ICD-10-CM

## 2017-05-23 DIAGNOSIS — E782 Mixed hyperlipidemia: Secondary | ICD-10-CM | POA: Diagnosis not present

## 2017-05-23 DIAGNOSIS — E785 Hyperlipidemia, unspecified: Secondary | ICD-10-CM | POA: Diagnosis not present

## 2017-05-23 LAB — BAYER DCA HB A1C WAIVED: HB A1C (BAYER DCA - WAIVED): 5.8 % (ref <7.0)

## 2017-05-23 MED ORDER — OLMESARTAN MEDOXOMIL-HCTZ 40-12.5 MG PO TABS: 1.0000 | ORAL_TABLET | Freq: Every day | ORAL | 1 refills | Status: DC

## 2017-05-23 MED ORDER — LEVOTHYROXINE SODIUM 50 MCG PO TABS: 50.0000 ug | ORAL_TABLET | Freq: Every day | ORAL | 1 refills | Status: DC

## 2017-05-23 MED ORDER — ATORVASTATIN CALCIUM 40 MG PO TABS: 40.0000 mg | ORAL_TABLET | Freq: Every day | ORAL | 1 refills | Status: DC

## 2017-05-23 NOTE — Patient Instructions (Signed)
Bone Health Bones protect organs, store calcium, and anchor muscles. Good health habits, such as eating nutritious foods and exercising regularly, are important for maintaining healthy bones. They can also help to prevent a condition that causes bones to lose density and become weak and brittle (osteoporosis). Why is bone mass important? Bone mass refers to the amount of bone tissue that you have. The higher your bone mass, the stronger your bones. An important step toward having healthy bones throughout life is to have strong and dense bones during childhood. A young adult who has a high bone mass is more likely to have a high bone mass later in life. Bone mass at its greatest it is called peak bone mass. A large decline in bone mass occurs in older adults. In women, it occurs about the time of menopause. During this time, it is important to practice good health habits, because if more bone is lost than what is replaced, the bones will become less healthy and more likely to break (fracture). If you find that you have a low bone mass, you may be able to prevent osteoporosis or further bone loss by changing your diet and lifestyle. How can I find out if my bone mass is low? Bone mass can be measured with an X-ray test that is called a bone mineral density (BMD) test. This test is recommended for all women who are age 65 or older. It may also be recommended for men who are age 70 or older, or for people who are more likely to develop osteoporosis due to:  Having bones that break easily.  Having a long-term disease that weakens bones, such as kidney disease or rheumatoid arthritis.  Having menopause earlier than normal.  Taking medicine that weakens bones, such as steroids, thyroid hormones, or hormone treatment for breast cancer or prostate cancer.  Smoking.  Drinking three or more alcoholic drinks each day.  What are the nutritional recommendations for healthy bones? To have healthy bones, you  need to get enough of the right minerals and vitamins. Most nutrition experts recommend getting these nutrients from the foods that you eat. Nutritional recommendations vary from person to person. Ask your health care provider what is healthy for you. Here are some general guidelines. Calcium Recommendations Calcium is the most important (essential) mineral for bone health. Most people can get enough calcium from their diet, but supplements may be recommended for people who are at risk for osteoporosis. Good sources of calcium include:  Dairy products, such as low-fat or nonfat milk, cheese, and yogurt.  Dark green leafy vegetables, such as bok choy and broccoli.  Calcium-fortified foods, such as orange juice, cereal, bread, soy beverages, and tofu products.  Nuts, such as almonds.  Follow these recommended amounts for daily calcium intake:  Children, age 1?3: 700 mg.  Children, age 4?8: 1,000 mg.  Children, age 9?13: 1,300 mg.  Teens, age 14?18: 1,300 mg.  Adults, age 19?50: 1,000 mg.  Adults, age 51?70: ? Men: 1,000 mg. ? Women: 1,200 mg.  Adults, age 71 or older: 1,200 mg.  Pregnant and breastfeeding females: ? Teens: 1,300 mg. ? Adults: 1,000 mg.  Vitamin D Recommendations Vitamin D is the most essential vitamin for bone health. It helps the body to absorb calcium. Sunlight stimulates the skin to make vitamin D, so be sure to get enough sunlight. If you live in a cold climate or you do not get outside often, your health care provider may recommend that you take vitamin   D supplements. Good sources of vitamin D in your diet include:  Egg yolks.  Saltwater fish.  Milk and cereal fortified with vitamin D.  Follow these recommended amounts for daily vitamin D intake:  Children and teens, age 1?18: 600 international units.  Adults, age 50 or younger: 400-800 international units.  Adults, age 51 or older: 800-1,000 international units.  Other Nutrients Other nutrients  for bone health include:  Phosphorus. This mineral is found in meat, poultry, dairy foods, nuts, and legumes. The recommended daily intake for adult men and adult women is 700 mg.  Magnesium. This mineral is found in seeds, nuts, dark green vegetables, and legumes. The recommended daily intake for adult men is 400?420 mg. For adult women, it is 310?320 mg.  Vitamin K. This vitamin is found in green leafy vegetables. The recommended daily intake is 120 mg for adult men and 90 mg for adult women.  What type of physical activity is best for building and maintaining healthy bones? Weight-bearing and strength-building activities are important for building and maintaining peak bone mass. Weight-bearing activities cause muscles and bones to work against gravity. Strength-building activities increases muscle strength that supports bones. Weight-bearing and muscle-building activities include:  Walking and hiking.  Jogging and running.  Dancing.  Gym exercises.  Lifting weights.  Tennis and racquetball.  Climbing stairs.  Aerobics.  Adults should get at least 30 minutes of moderate physical activity on most days. Children should get at least 60 minutes of moderate physical activity on most days. Ask your health care provide what type of exercise is best for you. Where can I find more information? For more information, check out the following websites:  National Osteoporosis Foundation: http://nof.org/learn/basics  National Institutes of Health: http://www.niams.nih.gov/Health_Info/Bone/Bone_Health/bone_health_for_life.asp  This information is not intended to replace advice given to you by your health care provider. Make sure you discuss any questions you have with your health care provider. Document Released: 05/07/2003 Document Revised: 09/04/2015 Document Reviewed: 02/19/2014 Elsevier Interactive Patient Education  2018 Elsevier Inc.  

## 2017-05-23 NOTE — Progress Notes (Signed)
Subjective:    Patient ID: Mckenzie Leonard, female    DOB: [birth year], 67 y.o.   MRN: [id number]  HPI  Mckenzie Leonard is here today for follow up of chronic medical problem.  Outpatient Encounter Medications as of 05/23/2017  Medication Sig  . atorvastatin (LIPITOR) 40 MG tablet Take 1 tablet (40 mg total) by mouth daily.  . Blood Glucose Monitoring Suppl (BLOOD GLUCOSE METER KIT AND SUPPLIES) KIT Dispense based on patient and insurance preference. Use to check BG once daily.  ICD 10 code = E11.9  . calcium-vitamin D (OSCAL WITH D) 500-200 MG-UNIT tablet Take 1 tablet by mouth 2 (two) times daily.  . Cholecalciferol (VITAMIN D) 2000 units CAPS Take 6,000 Units by mouth daily.   . IBU 800 MG tablet Take 1 Tablet by mouth every 8 hours as needed  . levothyroxine (SYNTHROID, LEVOTHROID) 50 MCG tablet Take 1 tablet (50 mcg total) by mouth daily.  . magnesium gluconate (MAGONATE) 500 MG tablet Take 500 mg by mouth daily.  . Multiple Vitamin (MULTIVITAMIN WITH MINERALS) TABS tablet Take 1 tablet by mouth daily.  Marland Kitchen olmesartan-hydrochlorothiazide (BENICAR HCT) 40-12.5 MG tablet Take 1 tablet by mouth daily.  . ONE TOUCH ULTRA TEST test strip USE TO check sugar ONCE daily OR PER directions  . TURMERIC PO Take 2 tablets by mouth 2 (two) times daily.    1. Essential hypertension  No c/o chest pain, sob or headache. Doe snot check blood pressures at home. BP Readings from Last 3 Encounters:  05/23/17 125/75  04/19/17 (!) 147/78  12/12/16 (!) 148/74     2. Hypothyroidism, unspecified type  No problems that she is aware of.  3. Controlled type 2 diabetes mellitus without complication, without long-term current use of insulin ([place]) last hgba1c was 6.0%. She does not check blood sugars everyday but when she does check them it is always below 130 fasting  4. Mixed hyperlipidemia  Tries to watch diet  5. Vitamin D deficiency  No problems that she is aware of  6. Osteopenia, unspecified location    No c/o back pain. dexa was done 08/29/16 with t score of -2.1. She was encouraged to take vitamin d and calcium supplement as well as weight bearing exercises.. She denies  Much exercise.  7. BMI 32.0-32.9,adult  No recent weight changes  8. Hypokalemia  No c/o of lower ext cramping.    New complaints: None today  Social history: Had total knee replacement-says she is doing well. Has some stiffness but is pverall much better then it was prior to surgery    Review of Systems  Constitutional: Negative for activity change and appetite change.  HENT: Negative.   Eyes: Negative for pain.  Respiratory: Negative for shortness of breath.   Cardiovascular: Negative for chest pain, palpitations and leg swelling.  Gastrointestinal: Negative for abdominal pain.  Endocrine: Negative for polydipsia.  Genitourinary: Negative.   Skin: Negative for rash.  Neurological: Negative for dizziness, weakness and headaches.  Hematological: Does not bruise/bleed easily.  Psychiatric/Behavioral: Negative.   All other systems reviewed and are negative.      Objective:   Physical Exam  Constitutional: She is oriented to person, place, and time. She appears well-developed and well-nourished.  HENT:  Nose: Nose normal.  Mouth/Throat: Oropharynx is clear and moist.  Eyes: EOM are normal.  Neck: Trachea normal, normal range of motion and full passive range of motion without pain. Neck supple. No JVD present. Carotid bruit is not  present. No thyromegaly present.  Cardiovascular: Normal rate, regular rhythm, normal heart sounds and intact distal pulses. Exam reveals no gallop and no friction rub.  No murmur heard. Pulmonary/Chest: Effort normal and breath sounds normal.  Abdominal: Soft. Bowel sounds are normal. She exhibits no distension and no mass. There is no tenderness.  Musculoskeletal: Normal range of motion.  Lymphadenopathy:    She has no cervical adenopathy.  Neurological: She is alert and  oriented to person, place, and time. She has normal reflexes.  Skin: Skin is warm and dry.  Psychiatric: She has a normal mood and affect. Her behavior is normal. Judgment and thought content normal.   BP 125/75   Pulse 77   Temp 97.8 F (36.6 C) (Oral)   Ht _0  (1.575 m)   Wt 194 lb (88 kg)   BMI 35.48 kg/m   HGBA1c 5.8%    Assessment & Plan:  1. Essential hypertension Low sodium diet - CMP14+EGFR - olmesartan-hydrochlorothiazide (BENICAR HCT) 40-12.5 MG tablet; Take 1 tablet by mouth daily.  Dispense: 90 tablet; Refill: 1  2. Hypothyroidism, unspecified type - Thyroid Panel With TSH - levothyroxine (SYNTHROID, LEVOTHROID) 50 MCG tablet; Take 1 tablet (50 mcg total) by mouth daily.  Dispense: 90 tablet; Refill: 1  3. Controlled type 2 diabetes mellitus without complication, without long-term current use of insulin ([facility]) Continue to watch carbs in diet - Bayer DCA Hb A1c Waived  4. Mixed hyperlipidemia - atorvastatin (LIPITOR) 40 MG tablet; Take 1 tablet (40 mg total) by mouth daily.  Dispense: 90 tablet; Refill: 1  5. Hyperlipidemia, unspecified hyperlipidemia type Low fat diet  6. Vitamin D deficiency  7. Osteopenia, unspecified location Weight bearimg exercises  8. BMI 32.0-32.9,adult Discussed diet and exercise for person with BMI >25 Will recheck weight in 3-6 months  9. Hypokalemia    Labs pending Health maintenance reviewed Diet and exercise encouraged Continue all meds Follow up  In 6 months   [place], FNP

## 2017-05-24 LAB — CMP14+EGFR
ALBUMIN: 4.8 g/dL (ref 3.6–4.8)
ALK PHOS: 79 IU/L (ref 39–117)
ALT: 24 IU/L (ref 0–32)
AST: 21 IU/L (ref 0–40)
Albumin/Globulin Ratio: 1.8 (ref 1.2–2.2)
BILIRUBIN TOTAL: 0.8 mg/dL (ref 0.0–1.2)
BUN/Creatinine Ratio: 17 (ref 12–28)
BUN: 15 mg/dL (ref 8–27)
CHLORIDE: 101 mmol/L (ref 96–106)
CO2: 22 mmol/L (ref 20–29)
Calcium: 9.4 mg/dL (ref 8.7–10.3)
Creatinine, Ser: 0.86 mg/dL (ref 0.57–1.00)
GFR calc Af Amer: 80 mL/min/{1.73_m2} (ref >59)
GFR calc non Af Amer: 70 mL/min/{1.73_m2} (ref >59)
GLUCOSE: 95 mg/dL (ref 65–99)
Globulin, Total: 2.7 g/dL (ref 1.5–4.5)
Potassium: 4.2 mmol/L (ref 3.5–5.2)
SODIUM: 142 mmol/L (ref 134–144)
Total Protein: 7.5 g/dL (ref 6.0–8.5)

## 2017-05-24 LAB — THYROID PANEL WITH TSH
FREE THYROXINE INDEX: 1.7 (ref 1.2–4.9)
T3 Uptake Ratio: 26 % (ref 24–39)
T4, Total: 6.6 ug/dL (ref 4.5–12.0)
TSH: 2.23 u[IU]/mL (ref 0.450–4.500)

## 2017-05-29 ENCOUNTER — Other Ambulatory Visit: Payer: Self-pay | Admitting: Nurse Practitioner

## 2017-06-26 DIAGNOSIS — H52223 Regular astigmatism, bilateral: Secondary | ICD-10-CM | POA: Diagnosis not present

## 2017-06-26 DIAGNOSIS — H5203 Hypermetropia, bilateral: Secondary | ICD-10-CM | POA: Diagnosis not present

## 2017-06-26 DIAGNOSIS — E119 Type 2 diabetes mellitus without complications: Secondary | ICD-10-CM | POA: Diagnosis not present

## 2017-06-26 DIAGNOSIS — H524 Presbyopia: Secondary | ICD-10-CM | POA: Diagnosis not present

## 2017-06-26 LAB — HM DIABETES EYE EXAM

## 2017-07-12 ENCOUNTER — Other Ambulatory Visit (HOSPITAL_COMMUNITY): Payer: Self-pay | Admitting: Pediatrics

## 2017-07-12 DIAGNOSIS — Z1231 Encounter for screening mammogram for malignant neoplasm of breast: Secondary | ICD-10-CM

## 2017-07-14 ENCOUNTER — Ambulatory Visit (HOSPITAL_COMMUNITY)
Admission: RE | Admit: 2017-07-14 | Discharge: 2017-07-14 | Disposition: A | Discharge status: Home / Self Care | Payer: Medicare Other | Source: Ambulatory Visit | Attending: Pediatrics | Admitting: Pediatrics

## 2017-07-14 ENCOUNTER — Encounter (HOSPITAL_COMMUNITY): Payer: Self-pay

## 2017-07-14 ENCOUNTER — Other Ambulatory Visit: Payer: Self-pay | Admitting: Nurse Practitioner

## 2017-07-14 DIAGNOSIS — Z1231 Encounter for screening mammogram for malignant neoplasm of breast: Secondary | ICD-10-CM

## 2017-08-30 ENCOUNTER — Encounter: Payer: Self-pay | Admitting: *Deleted

## 2017-08-30 ENCOUNTER — Ambulatory Visit (INDEPENDENT_AMBULATORY_CARE_PROVIDER_SITE_OTHER): Payer: Medicare Other | Admitting: *Deleted

## 2017-08-30 VITALS — BP 121/70 | HR 70 | Ht 61.0 in | Wt 196.0 lb

## 2017-08-30 DIAGNOSIS — Z0000 Encounter for general adult medical examination without abnormal findings: Secondary | ICD-10-CM | POA: Diagnosis not present

## 2017-08-30 DIAGNOSIS — Z1211 Encounter for screening for malignant neoplasm of colon: Secondary | ICD-10-CM

## 2017-08-30 NOTE — Patient Instructions (Signed)
  Mckenzie Leonard , Thank you for taking time to come for your Medicare Wellness Visit. I appreciate your ongoing commitment to your health goals. Please review the following plan we discussed and let me know if I can assist you in the future.   These are the goals we discussed: Goals    . Exercise 150 min/wk Moderate Activity       This is a list of the screening recommended for you and due dates:  Health Maintenance  Topic Date Due  . Flu Shot  01/23/2018*  . Pneumonia vaccines (1 of 2 - PCV13) 05/24/2018*  . Stool Blood Test  09/09/2017  . Hemoglobin A1C  11/23/2017  . Complete foot exam   05/24/2018  . Eye exam for diabetics  06/27/2018  . DEXA scan (bone density measurement)  08/30/2018  . Pap Smear  11/23/2018  . Mammogram  07/15/2019  . Tetanus Vaccine  02/17/2022  .  Hepatitis C: One time screening is recommended by Center for Disease Control  (CDC) for  adults born from [birth year] through 1967.   Completed  . Colon Cancer Screening  Discontinued  *Topic was postponed. The date shown is not the original due date.

## 2017-08-30 NOTE — Progress Notes (Addendum)
Subjective:   Mckenzie Leonard is a 67 y.o. female who presents for a Medicare Annual Wellness Visit. Nadie lives at home alone.    Review of Systems    Patient reports that her overall health is unchanged compared to last year.  Cardiac Risk Factors include: advanced age (>1mn, >>19women);hypertension;dyslipidemia;obesity (BMI >30kg/m2);sedentary lifestyle  Musculoskeletal: left lower leg swelling since having knee replacement last year. Sees ortho in the next few weeks.   All other systems negative       Current Medications (verified) Outpatient Encounter Medications as of 08/30/2017  Medication Sig  . Ascorbic Acid (VITAMIN C) 100 MG tablet Take 100 mg by mouth daily.  .Marland Kitchenatorvastatin (LIPITOR) 40 MG tablet Take 1 tablet (40 mg total) by mouth daily.  . Blood Glucose Monitoring Suppl (BLOOD GLUCOSE METER KIT AND SUPPLIES) KIT Dispense based on patient and insurance preference. Use to check BG once daily.  ICD 10 code = E11.9  . calcium-vitamin D (OSCAL WITH D) 500-200 MG-UNIT tablet Take 1 tablet by mouth 2 (two) times daily.  . Cholecalciferol (VITAMIN D) 2000 units CAPS Take 6,000 Units by mouth daily.   .Marland Kitchenglucose blood (ONE TOUCH ULTRA TEST) test strip Check blood sugars daily  . IBU 800 MG tablet Take 1 Tablet by mouth every 8 hours as needed  . levothyroxine (SYNTHROID, LEVOTHROID) 50 MCG tablet Take 1 tablet (50 mcg total) by mouth daily.  . magnesium gluconate (MAGONATE) 500 MG tablet Take 500 mg by mouth daily.  . Multiple Vitamin (MULTIVITAMIN WITH MINERALS) TABS tablet Take 1 tablet by mouth daily.  .Marland Kitchenolmesartan-hydrochlorothiazide (BENICAR HCT) 40-12.5 MG tablet Take 1 tablet by mouth daily.  . TURMERIC PO Take 2 tablets by mouth 2 (two) times daily.    No facility-administered encounter medications on file as of 08/30/2017.     Allergies (verified) Sulfa antibiotics; Statins; and Tetanus toxoids   History: Past Medical History:  Diagnosis Date  . Arthritis    . Diabetes mellitus without complication ([place]   . High cholesterol   . Hypertension   . Hypothyroidism   . PONV (postoperative nausea and vomiting)   . Thyroid disease    Past Surgical History:  Procedure Laterality Date  . BREAST BIOPSY Right    negative  . CARPAL TUNNEL RELEASE     Right  . CLOSED REDUCTION MANDIBLE  02/20/2012   Procedure: CLOSED REDUCTION MANDIBULAR;  Surgeon: CIsac Caddy DDS;  Location: [facility]  Service: Oral Surgery;  Laterality: Right;  Closed reduction zygonach arch fx  . KNEE CARTILAGE SURGERY Left   . ORIF FACIAL FRACTURE  02/20/2012   Procedure: OPEN REDUCTION INTERNAL FIXATION (ORIF) MULTIPLE FACIAL FRACTURES;  Surgeon: CIsac Caddy DDS;  Location: [facility]  Service: Oral Surgery;  Laterality: Right;  ORIF Rt ZMC (zygomaxillary)  . TOTAL KNEE ARTHROPLASTY Left 10/12/2016   Procedure: TOTAL KNEE ARTHROPLASTY;  Surgeon: HCarole Civil MD;  Location: [facility];  Service: Orthopedics;  Laterality: Left;   Family History  Problem Relation Age of Onset  . Arthritis Mother   . Heart disease Mother        enlarged heart  . Anemia Mother   . Stroke Mother   . Arthritis Father   . Diabetes Father   . Hypertension Father   . Cancer Sister        breast  . Diabetes Brother   . CAD Brother   . Healthy Daughter   . Healthy  Daughter    Social History   Socioeconomic History  . Marital status: Divorced    Spouse name: Not on file  . Number of children: 2  . Years of education: 10  . Highest education level: GED or equivalent  Occupational History  . Occupation: retired    Comment: Copy  . Financial resource strain: Not hard at all  . Food insecurity:    Worry: Never true    Inability: Never true  . Transportation needs:    Medical: No    Non-medical: No  Tobacco Use  . Smoking status: Never Smoker  . Smokeless tobacco: Never Used  Substance and Sexual Activity  . Alcohol use: No  . Drug use: No  .  Sexual activity: Yes  Lifestyle  . Physical activity:    Days per week: 5 days    Minutes per session: 30 min  . Stress: Not at all  Relationships  . Social connections:    Talks on phone: More than three times a week    Gets together: More than three times a week    Attends religious service: More than 4 times per year    Active member of club or organization: Yes    Attends meetings of clubs or organizations: More than 4 times per year    Relationship status: Divorced  Other Topics Concern  . Not on file  Social History Narrative  . Not on file    Tobacco Use No.  Clinical Intake:     Pain : No/denies pain     Nutritional Status: BMI > 30  Obese Diabetes: No  How often do you need to have someone help you when you read instructions, pamphlets, or other written materials from your doctor or pharmacy?: 1 - Never What is the last grade level you completed in school?: GED  Interpreter Needed?: No  Information entered by :: Chong Sicilian, RN   Activities of Daily Living In your present state of health, do you have any difficulty performing the following activities: 08/30/2017 10/12/2016  Hearing? N -  Vision? N -  Difficulty concentrating or making decisions? N -  Walking or climbing stairs? N -  Dressing or bathing? N -  Doing errands, shopping? N N  Preparing Food and eating ? N -  Using the Toilet? N -  In the past six months, have you accidently leaked urine? N -  Do you have problems with loss of bowel control? N -  Managing your Medications? N -  Managing your Finances? N -  Housekeeping or managing your Housekeeping? N -  Some recent data might be hidden     Diet Eats 2 to 3 meals a day. Combination of eating out and home cooked meals. Drinks water, tea, juice, milk.   Exercise Current Exercise Habits: Home exercise routine, Type of exercise: walking;Other - see comments;stretching, Time (Minutes): 30, Frequency (Times/Week): 3, Weekly Exercise  (Minutes/Week): 90, Intensity: Mild   Depression Screen PHQ 2/9 Scores 08/30/2017 05/23/2017 11/22/2016 09/09/2016 08/29/2016 03/10/2016 11/30/2015  PHQ - 2 Score 0 0 0 0 0 0 0     Fall Risk Fall Risk  08/30/2017 05/23/2017 11/22/2016 09/09/2016 08/29/2016  Falls in the past year? _0     Safety Is the patient's home free of loose throw rugs in walkways, pet beds, electrical cords, etc?   yes      Grab bars in the bathroom? no  Walkin shower? yes      Shower Seat? no      Handrails on the stairs?   yes      Adequate lighting?   yes  Patient Care Team: [name], FNP as PCP - General (Nurse Practitioner) Melina Schools, OD (Optometry) Carole Civil, MD as Consulting Physician (Orthopedic Surgery)   Left knee surgery 10/12/16. No other surgeries, hospitalizations, or ED visits.   Objective:    Today's Vitals   08/30/17 0845  BP: 121/70  Pulse: 70  Weight: 196 lb (88.9 kg)  Height: _0  (1.549 m)   Body mass index is 37.03 kg/m.  Advanced Directives 08/30/2017 11/01/2016 10/12/2016 10/12/2016 10/07/2016 08/29/2016 04/28/2014  Does Patient Have a Medical Advance Directive? _1  No No  Would patient like information on creating a medical advance directive? No - Patient declined - No - Patient declined No - Patient declined No - Patient declined No - Patient declined No - patient declined information  Pre-existing out of facility DNR order (yellow form or pink MOST form) - - - - - - -    Hearing/Vision  normal or No deficits noted during visit.  Cognitive Function: MMSE - Mini Mental State Exam 08/30/2017 08/29/2016  Orientation to time 5 5  Orientation to Place 5 5  Registration 3 3  Attention/ Calculation 5 5  Recall 3 3  Language- name 2 objects 2 2  Language- repeat 1 1  Language- follow 3 step command 3 3  Language- read & follow direction 1 1  Write a sentence 1 1  Copy design 1 1  Total score 30 30       Normal Cognitive Function  Screening: Yes    Immunizations and Health Maintenance Immunization History  Administered Date(s) Administered  . Tdap 02/18/2012   There are no preventive care reminders to display for this patient. Health Maintenance  Topic Date Due  . INFLUENZA VACCINE  01/23/2018 (Originally 09/28/2017)  . PNA vac Low Risk Adult (1 of 2 - PCV13) 05/24/2018 (Originally 11/09/2013)  . COLON CANCER SCREENING ANNUAL FOBT  09/09/2017  . HEMOGLOBIN A1C  11/23/2017  . FOOT EXAM  05/24/2018  . OPHTHALMOLOGY EXAM  06/27/2018  . DEXA SCAN  08/30/2018  . PAP SMEAR  11/23/2018  . MAMMOGRAM  07/15/2019  . TETANUS/TDAP  02/17/2022  . Hepatitis C Screening  Completed  . COLONOSCOPY  Discontinued        Assessment:   This is a routine wellness examination for [place].    Plan:    Goals    . Exercise 150 min/wk Moderate Activity        Health Maintenance Recommendations: Declined pneumonia vaccine and colonoscopy   Additional Screening Recommendations: Lung: Low Dose CT Chest recommended if Age 11-80 years, 30 pack-year currently smoking OR have quit w/in 15years. Patient does not qualify. Hepatitis C Screening recommended: no  Today's Orders Orders Placed This Encounter  Procedures  . Fecal occult blood, imunochemical    Standing Status:   Future    Standing Expiration Date:   09/23/2017    Keep f/u with Hassell Done, Mary-Margaret, FNP and any other specialty appointments you may have Continue current medications Prop leg up when sitting Move carefully to avoid falls. Use assistive devices like a can or walker if needed. Aim for at least 150 minutes of moderate activity a week. This can be done with chair exercises if necessary. Read or work on puzzles daily Stay connected  with friends and family  I have personally reviewed and noted the following in the patient's chart:   . Medical and social history . Use of alcohol, tobacco or illicit drugs  . Current medications and  supplements . Functional ability and status . Nutritional status . Physical activity . Advanced directives . List of other physicians . Hospitalizations, surgeries, and ER visits in previous 12 months . Vitals . Screenings to include cognitive, depression, and falls . Referrals and appointments  In addition, I have reviewed and discussed with patient certain preventive protocols, quality metrics, and best practice recommendations. A written personalized care plan for preventive services as well as general preventive health recommendations were provided to patient.     Chong Sicilian, RN   08/30/2017    I have reviewed and agree with the above AWV documentation.   Mary-Margaret Hassell Done, FNP

## 2017-10-25 ENCOUNTER — Ambulatory Visit (INDEPENDENT_AMBULATORY_CARE_PROVIDER_SITE_OTHER): Payer: Medicare Other

## 2017-10-25 ENCOUNTER — Ambulatory Visit: Payer: Medicare Other | Admitting: Orthopedic Surgery

## 2017-10-25 ENCOUNTER — Encounter: Payer: Self-pay | Admitting: Orthopedic Surgery

## 2017-10-25 VITALS — BP 142/86 | HR 78 | Ht 62.0 in | Wt 196.0 lb

## 2017-10-25 DIAGNOSIS — Z96652 Presence of left artificial knee joint: Secondary | ICD-10-CM

## 2017-10-25 MED ORDER — IBUPROFEN 800 MG PO TABS: 800.0000 mg | ORAL_TABLET | Freq: Three times a day (TID) | ORAL | 4 refills | Status: DC | PRN

## 2017-10-25 NOTE — Addendum Note (Signed)
Addended by: Carole Civil on: 10/25/2017 09:23 AM   Modules accepted: Orders

## 2017-10-25 NOTE — Progress Notes (Signed)
ANNUAL FOLLOW UP FOR  LEFT TKA   Chief Complaint  Patient presents with  . Routine Post Op    10/12/16 left total knee replacement      HPI: The patient is here for the annual  follow-up x-ray for knee replacement. The patient is not complaining of pain weakness instability or stiffness in the repaired knee.   ROS  Past Medical History:  Diagnosis Date  . Arthritis   . Diabetes mellitus without complication ([place])   . High cholesterol   . Hypertension   . Hypothyroidism   . PONV (postoperative nausea and vomiting)   . Thyroid disease      Examination of the LEFT KNEE  BP (!) 142/86   Pulse 78   Ht 5\' 2"  (1.575 m)   Wt 196 lb (88.9 kg)   BMI 35.85 kg/m   General the patient is normally groomed in no distress  Mood normal Affect pleasant   The patient is Awake and alert ; oriented normal   Inspection shows : incision healed nicely without erythema, no tenderness no swelling  Range of motion total range of motion is 120  Stability the knee is stable anterior to posterior as well as medial to lateral  Strength quadriceps strength is normal  Skin no erythema around the skin incision  Cardiovascular NO EDEMA   Neuro: normal sensation in the operative leg   Gait: normal expected gait without cane    Medical decision-making section  X-rays ordered with the following personal interpretation  Normal alignment without loosening   Diagnosis  Encounter Diagnosis  Name Primary?  . S/P total knee replacement, left 10/12/2016 Yes     Plan follow-up 1 year repeat x-rays

## 2017-11-23 ENCOUNTER — Encounter: Payer: Self-pay | Admitting: Nurse Practitioner

## 2017-11-23 ENCOUNTER — Ambulatory Visit (INDEPENDENT_AMBULATORY_CARE_PROVIDER_SITE_OTHER): Payer: Medicare Other | Admitting: Nurse Practitioner

## 2017-11-23 VITALS — BP 137/71 | HR 68 | Temp 98.6°F | Ht 62.0 in | Wt 198.0 lb

## 2017-11-23 DIAGNOSIS — E119 Type 2 diabetes mellitus without complications: Secondary | ICD-10-CM

## 2017-11-23 DIAGNOSIS — E782 Mixed hyperlipidemia: Secondary | ICD-10-CM

## 2017-11-23 DIAGNOSIS — E785 Hyperlipidemia, unspecified: Secondary | ICD-10-CM

## 2017-11-23 DIAGNOSIS — E559 Vitamin D deficiency, unspecified: Secondary | ICD-10-CM

## 2017-11-23 DIAGNOSIS — I10 Essential (primary) hypertension: Secondary | ICD-10-CM

## 2017-11-23 DIAGNOSIS — L989 Disorder of the skin and subcutaneous tissue, unspecified: Secondary | ICD-10-CM

## 2017-11-23 DIAGNOSIS — Z6832 Body mass index (BMI) 32.0-32.9, adult: Secondary | ICD-10-CM

## 2017-11-23 DIAGNOSIS — E876 Hypokalemia: Secondary | ICD-10-CM | POA: Diagnosis not present

## 2017-11-23 DIAGNOSIS — M8580 Other specified disorders of bone density and structure, unspecified site: Secondary | ICD-10-CM

## 2017-11-23 DIAGNOSIS — E039 Hypothyroidism, unspecified: Secondary | ICD-10-CM | POA: Diagnosis not present

## 2017-11-23 LAB — BAYER DCA HB A1C WAIVED: HB A1C (BAYER DCA - WAIVED): 6.1 % (ref <7.0)

## 2017-11-23 MED ORDER — ATORVASTATIN CALCIUM 40 MG PO TABS: 40.0000 mg | ORAL_TABLET | Freq: Every day | ORAL | 1 refills | Status: DC

## 2017-11-23 MED ORDER — OLMESARTAN MEDOXOMIL-HCTZ 40-12.5 MG PO TABS: 1.0000 | ORAL_TABLET | Freq: Every day | ORAL | 1 refills | Status: DC

## 2017-11-23 MED ORDER — LEVOTHYROXINE SODIUM 50 MCG PO TABS: 50.0000 ug | ORAL_TABLET | Freq: Every day | ORAL | 1 refills | Status: DC

## 2017-11-23 NOTE — Patient Instructions (Signed)
Actinic Keratosis An actinic keratosis is a precancerous growth on the skin. This means that it could develop into skin cancer if it is not treated. About 1% of these growths (actinic keratoses) turn into skin cancer within one year if they are not treated. It is important to have all of these growths evaluated to determine the best treatment approach. What are the causes? This condition is caused by getting too much ultraviolet (UV) radiation from the sun or other UV light sources. What increases the risk? The following factors may make you more likely to develop this condition:  Having light-colored skin and blue eyes.  Having blonde or red hair.  Spending a lot of time in the sun.  Inadequate skin protection when outdoors. This may include: ? Not using sunscreen properly. ? Not covering up skin that is exposed to sunlight.  Aging. The risk of developing an actinic keratosis increases with age.  What are the signs or symptoms? Actinic keratoses look like scaly, rough spots of skin.They can be as small as a pinhead or as big as a quarter. They may itch, hurt, or feel sensitive. In most cases, the growths become red. In some cases, they may be skin-colored, light tan, dark tan, pink, or a combination of any of these colors. There may be a small piece of pink or gray skin (skin tag) growing from the actinic keratosis. In some cases, it may be easier to notice actinic keratoses by feeling them, rather than seeing them. Actinic keratoses appear most often on areas of skin that get a lot of sun exposure, including the scalp, face, ears, lips, upper back, forearms, and the backs of the hands. Sometimes, actinic keratoses disappear, but many reappear a few days to a few weeks later. How is this diagnosed? This condition is usually diagnosed with a physical exam. A tissue sample may be removed from the actinic keratosis and examined under a microscope (biopsy). How is this treated?  Treatment for  this condition may include:  Scraping off the actinic keratosis (curettage).  Freezing the actinic keratosis with liquid nitrogen (cryosurgery). This causes the growth to eventually fall off the skin.  Applying medicated creams or gels to destroy the cells in the growth.  Applying chemicals to the actinic keratosis to make the outer layers of skin peel off (chemical peel).  Photodynamic therapy. In this procedure, medicated cream is applied to the actinic keratosis. This cream increases your skin's sensitivity to light. Then, a strong light is aimed at the actinic keratosis to destroy cells in the growth.  Follow these instructions at home: Skin care  Apply cool, wet cloths (cool compresses) to the affected areas.  Do not scratch your skin.  Check your skin regularly for any growths, especially growths that: ? Start to itch or bleed. ? Change in size, shape, or color. Caring for the treated area  Keep the treated area clean and dry as told by your health care provider.  Do not apply any medicine, cream, or lotion to the treated area unless your health care provider tells you to do that.  Do not pick at blisters or try to break them open. This can cause infection and scarring.  If you have red or irritated skin after treatment, follow instructions from your health care provider about how to take care of the treated area. Make sure you: ? Wash your hands with soap and water before you change your bandage (dressing). If soap and water are not available, use  hand sanitizer. ? Change your dressing as told by your health care provider.  If you have red or irritated skin after treatment, check your treated area every day for signs of infection. Check for: ? Swelling, pain, or more redness. ? Fluid or blood. ? Warmth. ? Pus or a bad smell. General instructions  Take over-the-counter and prescription medicines only as told by your health care provider.  Return to your normal  activities as told by your health care provider. Ask your health care provider what activities are safe for you.  Do not use any tobacco products, such as cigarettes, chewing tobacco, and e-cigarettes. If you need help quitting, ask your health care provider.  Have a skin exam done every year by a health care provider who is a skin conditions specialist (dermatologist).  Keep all follow-up visits as told by your health care provider. This is important. How is this prevented?  Do not get sunburns.  Try to avoid the sun between 10:00 a.m. and 4:00 p.m. This is when the UV light is the strongest.  Use a sunscreen or sunblock with SPF 30 (sun protection factor 30) or greater.  Apply sunscreen before you are exposed to sunlight, and reapply periodically as often as directed by the instructions on the sunscreen container.  Always wear sunglasses that have UV protection, and always wear hats and clothing to protect your skin from sunlight.  When possible, avoid medicines that increase your sensitivity to sunlight. These include: ? Certain antibiotic medicines. ? Certain water pills (diuretics). ? Certain prescription medicines that are used to treat acne (retinoids).  Do not use tanning beds or other indoor tanning devices. Contact a health care provider if:  You notice any changes or new growths on your skin.  You have swelling, pain, or more redness around your treated area.  You have fluid or blood coming from your treated area.  Your treated area feels warm to the touch.  You have pus or a bad smell coming from your treated area.  You have a fever.  You have a blister that becomes large and painful. This information is not intended to replace advice given to you by your health care provider. Make sure you discuss any questions you have with your health care provider. Document Released: 05/13/2008 Document Revised: 10/16/2015 Document Reviewed: 10/25/2014 Elsevier Interactive  Patient Education  2018 [employer] Inc[employer]  

## 2017-11-23 NOTE — Progress Notes (Signed)
Subjective:    Patient ID: Mckenzie Leonard, female    DOB: 1951/02/15, 67 y.o.   MRN: [id number]   Chief Complaint: Medical Management of Chronic issues  HPI:  1. Essential hypertension  No c/o chest pain, sob or headache. Does not check blood pressure at home. BP Readings from Last 3 Encounters:  10/25/17 (!) 142/86  08/30/17 121/70  05/23/17 125/75     2. Controlled type 2 diabetes mellitus without complication, without long-term current use of insulin ([place]) [id number] 5.8% she does not check blood sugar everyday. She does watch her diet.  3. Hypothyroidism, unspecified type  No problems that she is aware.  4. Hypokalemia  No c/o lower ext cramps  5. Hyperlipidemia, unspecified hyperlipidemia type  Says that she does not eat a lot of fried foods  6. BMI 32.0-32.9,adult  No recent weight changes  7. Vitamin D deficiency  Takes vitamin d supplement daily  8. Osteopenia, unspecified location  Last dexascan was 08/29/16. tscore was -2.1. She does very little weight bearing exercise.    Outpatient Encounter Medications as of 11/23/2017  Medication Sig  . Ascorbic Acid (VITAMIN C) 100 MG tablet Take 100 mg by mouth daily.  Marland Kitchen atorvastatin (LIPITOR) 40 MG tablet Take 1 tablet (40 mg total) by mouth daily.  . Blood Glucose Monitoring Suppl (BLOOD GLUCOSE METER KIT AND SUPPLIES) KIT Dispense based on patient and insurance preference. Use to check BG once daily.  ICD 10 code = E11.9  . calcium-vitamin D (OSCAL WITH D) 500-200 MG-UNIT tablet Take 1 tablet by mouth 2 (two) times daily.  . Cholecalciferol (VITAMIN D) 2000 units CAPS Take 6,000 Units by mouth daily.   Marland Kitchen glucose blood (ONE TOUCH ULTRA TEST) test strip Check blood sugars daily  . ibuprofen (IBU) 800 MG tablet Take 1 tablet (800 mg total) by mouth every 8 (eight) hours as needed.  Marland Kitchen levothyroxine (SYNTHROID, LEVOTHROID) 50 MCG tablet Take 1 tablet (50 mcg total) by mouth daily.  . magnesium gluconate (MAGONATE) 500 MG tablet Take  500 mg by mouth daily.  . Multiple Vitamin (MULTIVITAMIN WITH MINERALS) TABS tablet Take 1 tablet by mouth daily.  Marland Kitchen olmesartan-hydrochlorothiazide (BENICAR HCT) 40-12.5 MG tablet Take 1 tablet by mouth daily.  . TURMERIC PO Take 2 tablets by mouth 2 (two) times daily.       New complaints: None today  Social history: Has finished with her rehab for her total knee replacement and is doing well.  Review of Systems     Objective:   Physical Exam  Constitutional: She is oriented to person, place, and time. She appears well-developed and well-nourished.  HENT:  Head: Normocephalic and atraumatic.  Right Ear: External ear normal.  Left Ear: External ear normal.  Nose: Nose normal.  Mouth/Throat: Oropharynx is clear and moist.  Eyes: Pupils are equal, round, and reactive to light. EOM are normal.  Neck: Normal range of motion. Neck supple. No thyromegaly present.  Cardiovascular: Normal rate, regular rhythm, normal heart sounds and intact distal pulses.  Pulmonary/Chest: Effort normal and breath sounds normal.  Abdominal: Soft. Bowel sounds are normal. There is no tenderness.  Musculoskeletal: Normal range of motion. She exhibits edema (left lower extremity +2).  Neurological: She is alert and oriented to person, place, and time. She displays normal reflexes. No cranial nerve deficit.  Skin: Skin is warm and dry. Lesion noted.  1cm x1cm round scaly, pink papule on anterior shin  Psychiatric: She has a normal mood and affect.  Her behavior is normal. Thought content normal.  Nursing note and vitals reviewed.     BP 137/71   Pulse 68   Temp 98.6 F (37 C) (Oral)   Ht '5\' 2"'$  (1.575 m)   Wt 198 lb (89.8 kg)   BMI 36.21 kg/m        Assessment & Plan:  Mckenzie Leonard comes in today with chief complaint of Medical Management of Chronic Issues   Diagnosis and orders addressed:  1. Essential hypertension Low sodium diet - olmesartan-hydrochlorothiazide (BENICAR HCT)  40-12.5 MG tablet; Take 1 tablet by mouth daily.  Dispense: 90 tablet; Refill: 1  2. Controlled type 2 diabetes mellitus without complication, without long-term current use of insulin ([facility]) Continue to watch carbs in diet - Bayer DCA Hb A1c Waived - Microalbumin / creatinine urine ratio  3. Hypothyroidism, unspecified type - Thyroid Panel With TSH - levothyroxine (SYNTHROID, LEVOTHROID) 50 MCG tablet; Take 1 tablet (50 mcg total) by mouth daily.  Dispense: 90 tablet; Refill: 1  4. Hypokalemia  5.  Mixed hyperlipidemia *Low fat diet** - atorvastatin (LIPITOR) 40 MG tablet; Take 1 tablet (40 mg total) by mouth daily.  Dispense: 90 tablet; Refill: 1 - CMP14+EGFR - Lipid panel  6. BMI 32.0-32.9,adult Discussed diet and exercise for person with BMI >25 Will recheck weight in 3-6 months  7. Vitamin D deficiency Continue vitamin d supplement  8. Osteopenia, unspecified location Weight bearing exercises  9. Skin lesion of left leg Do not pick or scratch at  Area Probably acktininc keratosis-patient has history of laying in tanning beds - Ambulatory referral to Dermatology  10. Lower ext edema       Wear compression socks  Labs pending Health Maintenance reviewed Diet and exercise encouraged  Follow up plan: 3 months   Mary-Margaret Hassell Done, FNP

## 2017-11-24 LAB — LIPID PANEL
CHOL/HDL RATIO: 3 ratio (ref 0.0–4.4)
Cholesterol, Total: 115 mg/dL (ref 100–199)
HDL: 38 mg/dL — ABNORMAL LOW (ref >39)
LDL CALC: 51 mg/dL (ref 0–99)
Triglycerides: 132 mg/dL (ref 0–149)
VLDL CHOLESTEROL CAL: 26 mg/dL (ref 5–40)

## 2017-11-24 LAB — CMP14+EGFR
ALBUMIN: 4.6 g/dL (ref 3.6–4.8)
ALK PHOS: 76 IU/L (ref 39–117)
ALT: 22 IU/L (ref 0–32)
AST: 15 IU/L (ref 0–40)
Albumin/Globulin Ratio: 1.9 (ref 1.2–2.2)
BUN/Creatinine Ratio: 22 (ref 12–28)
BUN: 16 mg/dL (ref 8–27)
Bilirubin Total: 0.6 mg/dL (ref 0.0–1.2)
CALCIUM: 9.2 mg/dL (ref 8.7–10.3)
CO2: 24 mmol/L (ref 20–29)
CREATININE: 0.74 mg/dL (ref 0.57–1.00)
Chloride: 100 mmol/L (ref 96–106)
GFR, EST AFRICAN AMERICAN: 96 mL/min/{1.73_m2} (ref >59)
GFR, EST NON AFRICAN AMERICAN: 83 mL/min/{1.73_m2} (ref >59)
GLOBULIN, TOTAL: 2.4 g/dL (ref 1.5–4.5)
GLUCOSE: 107 mg/dL — AB (ref 65–99)
Potassium: 4.2 mmol/L (ref 3.5–5.2)
SODIUM: 144 mmol/L (ref 134–144)
TOTAL PROTEIN: 7 g/dL (ref 6.0–8.5)

## 2017-11-24 LAB — MICROALBUMIN / CREATININE URINE RATIO
CREATININE, UR: 51 mg/dL
MICROALBUM., U, RANDOM: 3.6 ug/mL
Microalb/Creat Ratio: 7.1 mg/g creat (ref 0.0–30.0)

## 2017-11-24 LAB — THYROID PANEL WITH TSH
Free Thyroxine Index: 1.6 (ref 1.2–4.9)
T3 UPTAKE RATIO: 26 % (ref 24–39)
T4, Total: 6.3 ug/dL (ref 4.5–12.0)
TSH: 2.12 u[IU]/mL (ref 0.450–4.500)

## 2017-11-30 DIAGNOSIS — X32XXXA Exposure to sunlight, initial encounter: Secondary | ICD-10-CM | POA: Diagnosis not present

## 2017-11-30 DIAGNOSIS — L570 Actinic keratosis: Secondary | ICD-10-CM | POA: Diagnosis not present

## 2017-11-30 DIAGNOSIS — D225 Melanocytic nevi of trunk: Secondary | ICD-10-CM | POA: Diagnosis not present

## 2018-03-01 ENCOUNTER — Encounter: Payer: Self-pay | Admitting: Nurse Practitioner

## 2018-03-01 ENCOUNTER — Ambulatory Visit (INDEPENDENT_AMBULATORY_CARE_PROVIDER_SITE_OTHER): Payer: Medicare Other | Admitting: Nurse Practitioner

## 2018-03-01 VITALS — BP 139/78 | HR 62 | Temp 96.7°F | Ht 62.0 in | Wt 199.0 lb

## 2018-03-01 DIAGNOSIS — E119 Type 2 diabetes mellitus without complications: Secondary | ICD-10-CM

## 2018-03-01 DIAGNOSIS — E876 Hypokalemia: Secondary | ICD-10-CM

## 2018-03-01 DIAGNOSIS — E785 Hyperlipidemia, unspecified: Secondary | ICD-10-CM | POA: Diagnosis not present

## 2018-03-01 DIAGNOSIS — E782 Mixed hyperlipidemia: Secondary | ICD-10-CM

## 2018-03-01 DIAGNOSIS — M8580 Other specified disorders of bone density and structure, unspecified site: Secondary | ICD-10-CM

## 2018-03-01 DIAGNOSIS — E039 Hypothyroidism, unspecified: Secondary | ICD-10-CM

## 2018-03-01 DIAGNOSIS — E559 Vitamin D deficiency, unspecified: Secondary | ICD-10-CM

## 2018-03-01 DIAGNOSIS — I10 Essential (primary) hypertension: Secondary | ICD-10-CM

## 2018-03-01 LAB — BAYER DCA HB A1C WAIVED: HB A1C (BAYER DCA - WAIVED): 6.1 % (ref <7.0)

## 2018-03-01 MED ORDER — ATORVASTATIN CALCIUM 40 MG PO TABS: 40.0000 mg | ORAL_TABLET | Freq: Every day | ORAL | 1 refills | Status: DC

## 2018-03-01 MED ORDER — OLMESARTAN MEDOXOMIL-HCTZ 40-12.5 MG PO TABS: 1.0000 | ORAL_TABLET | Freq: Every day | ORAL | 1 refills | Status: DC

## 2018-03-01 MED ORDER — LEVOTHYROXINE SODIUM 50 MCG PO TABS: 50.0000 ug | ORAL_TABLET | Freq: Every day | ORAL | 1 refills | Status: DC

## 2018-03-01 NOTE — Patient Instructions (Signed)

## 2018-03-01 NOTE — Progress Notes (Signed)
Subjective:    Patient ID: Mckenzie Leonard, female    DOB: [birth year], 68 y.o.   MRN: [id number]   Chief Complaint: Medical Management of Chronic Issues   HPI:  1. Controlled type 2 diabetes mellitus without complication, without long-term current use of insulin ([place]) last hgba1c was 6.1%.fasting blood sugars run from 130-140.Denies any symptoms of hypoglycemia.  2. Hyperlipidemia, unspecified hyperlipidemia type  Doe snot really watch diet and does very little exercise.  3. Essential hypertension  No c/o chest pain , sob or headache. Does not check blood pressure at home. BP Readings from Last 3 Encounters:  03/01/18 139/78  11/23/17 137/71  10/25/17 (!) 142/86     4. Hypothyroidism, unspecified type  No problems that she is aware of.  5. Osteopenia, unspecified location  Last dexascan was done on 08/29/16 with score of -2.1. she is on a daly vitamin d and calcium supplenment. Not much weight bearing exercise.  6. Vitamin D deficiency  Takes vitamin d supplement  7. Hypokalemia  No c/o lower ext cramping labs to be rechecked today  8. BMI 32.0-32.9,adult  weight has increased over th e;asty year    Outpatient Encounter Medications as of 03/01/2018  Medication Sig  . Ascorbic Acid (VITAMIN C) 100 MG tablet Take 100 mg by mouth daily.  Marland Kitchen atorvastatin (LIPITOR) 40 MG tablet Take 1 tablet (40 mg total) by mouth daily.  . Blood Glucose Monitoring Suppl (BLOOD GLUCOSE METER KIT AND SUPPLIES) KIT Dispense based on patient and insurance preference. Use to check BG once daily.  ICD 10 code = E11.9  . calcium-vitamin D (OSCAL WITH D) 500-200 MG-UNIT tablet Take 1 tablet by mouth 2 (two) times daily.  . Cholecalciferol (VITAMIN D) 2000 units CAPS Take 6,000 Units by mouth daily.   Marland Kitchen glucose blood (ONE TOUCH ULTRA TEST) test strip Check blood sugars daily  . ibuprofen (IBU) 800 MG tablet Take 1 tablet (800 mg total) by mouth every 8 (eight) hours as needed.  Marland Kitchen levothyroxine (SYNTHROID,  LEVOTHROID) 50 MCG tablet Take 1 tablet (50 mcg total) by mouth daily.  . magnesium gluconate (MAGONATE) 500 MG tablet Take 500 mg by mouth daily.  . Multiple Vitamin (MULTIVITAMIN WITH MINERALS) TABS tablet Take 1 tablet by mouth daily.  Marland Kitchen olmesartan-hydrochlorothiazide (BENICAR HCT) 40-12.5 MG tablet Take 1 tablet by mouth daily.  . TURMERIC PO Take 2 tablets by mouth 2 (two) times daily.       New complaints: None  today  Social history: Retired from [employer]   Review of Systems  Constitutional: Negative for activity change and appetite change.  HENT: Negative.   Eyes: Negative for pain.  Respiratory: Negative for shortness of breath.   Cardiovascular: Negative for chest pain, palpitations and leg swelling.  Gastrointestinal: Negative for abdominal pain.  Endocrine: Negative for polydipsia.  Genitourinary: Negative.   Skin: Negative for rash.  Neurological: Negative for dizziness, weakness and headaches.  Hematological: Does not bruise/bleed easily.  Psychiatric/Behavioral: Negative.   All other systems reviewed and are negative.      Objective:   Physical Exam Vitals signs and nursing note reviewed.  Constitutional:      General: She is not in acute distress.    Appearance: Normal appearance. She is well-developed.  HENT:     Head: Normocephalic.     Nose: Nose normal.  Eyes:     Pupils: Pupils are equal, round, and reactive to light.  Neck:     Musculoskeletal: Normal  range of motion and neck supple.     Vascular: No carotid bruit or JVD.  Cardiovascular:     Rate and Rhythm: Normal rate and regular rhythm.     Heart sounds: Normal heart sounds.  Pulmonary:     Effort: Pulmonary effort is normal. No respiratory distress.     Breath sounds: Normal breath sounds. No wheezing or rales.  Chest:     Chest wall: No tenderness.  Abdominal:     General: Bowel sounds are normal. There is no distension or abdominal bruit.     Palpations: Abdomen is  soft. There is no hepatomegaly, splenomegaly, mass or pulsatile mass.     Tenderness: There is no abdominal tenderness.  Musculoskeletal: Normal range of motion.  Lymphadenopathy:     Cervical: No cervical adenopathy.  Skin:    General: Skin is warm and dry.  Neurological:     Mental Status: She is alert and oriented to person, place, and time.     Deep Tendon Reflexes: Reflexes are normal and symmetric.  Psychiatric:        Behavior: Behavior normal.        Thought Content: Thought content normal.        Judgment: Judgment normal.       BP 139/78   Pulse 62   Temp (!) 96.7 F (35.9 C) (Oral)   Ht _0  (1.575 m)   Wt 199 lb (90.3 kg)   BMI 36.40 kg/m   hgba1c 6.1%    Assessment & Plan:  Mckenzie Leonard comes in today with chief complaint of Medical Management of Chronic Issues   Diagnosis and orders addressed:  1. Controlled type 2 diabetes mellitus without complication, without long-term current use of insulin ([place]) Continue to watch cars in diet - Bayer DCA Hb A1c Waived  2. Hyperlipidemia, unspecified hyperlipidemia type Low fat diet - Lipid panel  3. Essential hypertension Low sodium diet - CMP14+EGFR - olmesartan-hydrochlorothiazide (BENICAR HCT) 40-12.5 MG tablet; Take 1 tablet by mouth daily.  Dispense: 90 tablet; Refill: 1  4. Hypothyroidism, unspecified type - levothyroxine (SYNTHROID, LEVOTHROID) 50 MCG tablet; Take 1 tablet (50 mcg total) by mouth daily.  Dispense: 90 tablet; Refill: 1 - Thyroid Panel With TSH  5. Osteopenia, unspecified location Weight bearing exercsies  6. Vitamin D deficiency Continue daily vitamin d supplement  7. Hypokalemia  8. Morbid obesity ([place]) Discussed diet and exercise for person with BMI >25 Will recheck weight in 3-6 months  9. Mixed hyperlipidemia Low fat diet - atorvastatin (LIPITOR) 40 MG tablet; Take 1 tablet (40 mg total) by mouth daily.  Dispense: 90 tablet; Refill: 1   Labs pending Health  Maintenance reviewed Diet and exercise encouraged  Follow up plan: 6 months   Mary-Margaret Hassell Done, FNP

## 2018-03-02 LAB — CMP14+EGFR
A/G RATIO: 2 (ref 1.2–2.2)
ALBUMIN: 4.7 g/dL (ref 3.6–4.8)
ALK PHOS: 73 IU/L (ref 39–117)
ALT: 22 IU/L (ref 0–32)
AST: 20 IU/L (ref 0–40)
BUN / CREAT RATIO: 17 (ref 12–28)
BUN: 12 mg/dL (ref 8–27)
Bilirubin Total: 0.9 mg/dL (ref 0.0–1.2)
CO2: 23 mmol/L (ref 20–29)
CREATININE: 0.69 mg/dL (ref 0.57–1.00)
Calcium: 9.3 mg/dL (ref 8.7–10.3)
Chloride: 97 mmol/L (ref 96–106)
GFR calc Af Amer: 103 mL/min/{1.73_m2} (ref >59)
GFR, EST NON AFRICAN AMERICAN: 89 mL/min/{1.73_m2} (ref >59)
GLOBULIN, TOTAL: 2.3 g/dL (ref 1.5–4.5)
Glucose: 104 mg/dL — ABNORMAL HIGH (ref 65–99)
POTASSIUM: 4.4 mmol/L (ref 3.5–5.2)
SODIUM: 140 mmol/L (ref 134–144)
Total Protein: 7 g/dL (ref 6.0–8.5)

## 2018-03-02 LAB — THYROID PANEL WITH TSH
FREE THYROXINE INDEX: 1.9 (ref 1.2–4.9)
T3 UPTAKE RATIO: 26 % (ref 24–39)
T4 TOTAL: 7.4 ug/dL (ref 4.5–12.0)
TSH: 1.67 u[IU]/mL (ref 0.450–4.500)

## 2018-03-02 LAB — LIPID PANEL
CHOL/HDL RATIO: 3 ratio (ref 0.0–4.4)
CHOLESTEROL TOTAL: 122 mg/dL (ref 100–199)
HDL: 41 mg/dL (ref >39)
LDL CALC: 53 mg/dL (ref 0–99)
TRIGLYCERIDES: 142 mg/dL (ref 0–149)
VLDL Cholesterol Cal: 28 mg/dL (ref 5–40)

## 2018-03-08 DIAGNOSIS — X32XXXD Exposure to sunlight, subsequent encounter: Secondary | ICD-10-CM | POA: Diagnosis not present

## 2018-03-08 DIAGNOSIS — L570 Actinic keratosis: Secondary | ICD-10-CM | POA: Diagnosis not present

## 2018-07-24 ENCOUNTER — Other Ambulatory Visit: Payer: Self-pay | Admitting: Nurse Practitioner

## 2018-07-24 DIAGNOSIS — I10 Essential (primary) hypertension: Secondary | ICD-10-CM

## 2018-07-25 ENCOUNTER — Other Ambulatory Visit: Payer: Self-pay | Admitting: Nurse Practitioner

## 2018-08-14 ENCOUNTER — Other Ambulatory Visit (HOSPITAL_COMMUNITY): Payer: Self-pay | Admitting: Nurse Practitioner

## 2018-08-14 DIAGNOSIS — Z1231 Encounter for screening mammogram for malignant neoplasm of breast: Secondary | ICD-10-CM

## 2018-08-23 ENCOUNTER — Telehealth: Payer: Self-pay | Admitting: Nurse Practitioner

## 2018-08-24 LAB — HM DIABETES EYE EXAM

## 2018-08-27 ENCOUNTER — Other Ambulatory Visit: Payer: Self-pay

## 2018-08-27 ENCOUNTER — Ambulatory Visit (HOSPITAL_COMMUNITY)
Admission: RE | Admit: 2018-08-27 | Discharge: 2018-08-27 | Disposition: A | Discharge status: Home / Self Care | Payer: Medicare Other | Source: Ambulatory Visit | Attending: Nurse Practitioner | Admitting: Nurse Practitioner

## 2018-08-27 DIAGNOSIS — Z1231 Encounter for screening mammogram for malignant neoplasm of breast: Secondary | ICD-10-CM | POA: Diagnosis not present

## 2018-08-29 ENCOUNTER — Other Ambulatory Visit: Payer: Self-pay

## 2018-08-30 ENCOUNTER — Ambulatory Visit: Payer: Medicare Other

## 2018-08-30 ENCOUNTER — Encounter: Payer: Self-pay | Admitting: Nurse Practitioner

## 2018-08-30 ENCOUNTER — Ambulatory Visit (INDEPENDENT_AMBULATORY_CARE_PROVIDER_SITE_OTHER): Payer: Medicare Other | Admitting: Nurse Practitioner

## 2018-08-30 VITALS — BP 134/76 | HR 72 | Temp 97.4°F | Ht 62.0 in | Wt 200.0 lb

## 2018-08-30 DIAGNOSIS — Z6836 Body mass index (BMI) 36.0-36.9, adult: Secondary | ICD-10-CM

## 2018-08-30 DIAGNOSIS — E119 Type 2 diabetes mellitus without complications: Secondary | ICD-10-CM

## 2018-08-30 DIAGNOSIS — E039 Hypothyroidism, unspecified: Secondary | ICD-10-CM | POA: Diagnosis not present

## 2018-08-30 DIAGNOSIS — E782 Mixed hyperlipidemia: Secondary | ICD-10-CM

## 2018-08-30 DIAGNOSIS — E876 Hypokalemia: Secondary | ICD-10-CM | POA: Diagnosis not present

## 2018-08-30 DIAGNOSIS — I10 Essential (primary) hypertension: Secondary | ICD-10-CM | POA: Diagnosis not present

## 2018-08-30 DIAGNOSIS — E785 Hyperlipidemia, unspecified: Secondary | ICD-10-CM | POA: Diagnosis not present

## 2018-08-30 DIAGNOSIS — M8580 Other specified disorders of bone density and structure, unspecified site: Secondary | ICD-10-CM | POA: Diagnosis not present

## 2018-08-30 DIAGNOSIS — E559 Vitamin D deficiency, unspecified: Secondary | ICD-10-CM

## 2018-08-30 LAB — BAYER DCA HB A1C WAIVED: HB A1C (BAYER DCA - WAIVED): 6.2 % (ref <7.0)

## 2018-08-30 MED ORDER — LEVOTHYROXINE SODIUM 50 MCG PO TABS: 50.0000 ug | ORAL_TABLET | Freq: Every day | ORAL | 1 refills | Status: DC

## 2018-08-30 MED ORDER — ATORVASTATIN CALCIUM 40 MG PO TABS: 40.0000 mg | ORAL_TABLET | Freq: Every day | ORAL | 1 refills | Status: DC

## 2018-08-30 MED ORDER — OLMESARTAN MEDOXOMIL-HCTZ 40-12.5 MG PO TABS: 1.0000 | ORAL_TABLET | Freq: Every day | ORAL | 1 refills | Status: DC

## 2018-08-30 NOTE — Patient Instructions (Signed)
Osteopenia  Osteopenia is a loss of thickness (density) inside of the bones. Another name for osteopenia is low bone mass. Mild osteopenia is a normal part of aging. It is not a disease, and it does not cause symptoms. However, if you have osteopenia and continue to lose bone mass, you could develop a condition that causes the bones to become thin and break more easily (osteoporosis). You may also lose some height, have back pain, and have a stooped posture. Although osteopenia is not a disease, making changes to your lifestyle and diet can help to prevent osteopenia from developing into osteoporosis. What are the causes? Osteopenia is caused by loss of calcium in the bones.  Bones are constantly changing. Old bone cells are continually being replaced with new bone cells. This process builds new bone. The mineral calcium is needed to build new bone and maintain bone density. Bone density is usually highest around age 35. After that, most people's bodies cannot replace all the bone they have lost with new bone. What increases the risk? You are more likely to develop this condition if:  You are older than age 50.  You are a woman who went through menopause early.  You have a long illness that keeps you in bed.  You do not get enough exercise.  You lack certain nutrients (malnutrition).  You have an overactive thyroid gland (hyperthyroidism).  You smoke.  You drink a lot of alcohol.  You are taking medicines that weaken the bones, such as steroids. What are the signs or symptoms? This condition does not cause any symptoms. You may have a slightly higher risk for bone breaks (fractures), so getting fractures more easily than normal may be an indication of osteopenia. How is this diagnosed? Your health care provider can diagnose this condition with a special type of X-ray exam that measures bone density (dual-energy X-ray absorptiometry, DEXA). This test can measure bone density in your  hips, spine, and wrists. Osteopenia has no symptoms, so this condition is usually diagnosed after a routine bone density screening test is done for osteoporosis. This routine screening is usually done for:  Women who are age 65 or older.  Men who are age 70 or older. If you have risk factors for osteopenia, you may have the screening test at an earlier age. How is this treated? Making dietary and lifestyle changes can lower your risk for osteoporosis. If you have severe osteopenia that is close to becoming osteoporosis, your health care provider may prescribe medicines and dietary supplements such as calcium and vitamin D. These supplements help to rebuild bone density. Follow these instructions at home:   Take over-the-counter and prescription medicines only as told by your health care provider. These include vitamins and supplements.  Eat a diet that is high in calcium and vitamin D. ? Calcium is found in dairy products, beans, salmon, and leafy green vegetables like spinach and broccoli. ? Look for foods that have vitamin D and calcium added to them (fortified foods), such as orange juice, cereal, and bread.  Do 30 or more minutes of a weight-bearing exercise every day, such as walking, jogging, or playing a sport. These types of exercises strengthen the bones.  Take precautions at home to lower your risk of falling, such as: ? Keeping rooms well-lit and free of clutter, such as cords. ? Installing safety rails on stairs. ? Using rubber mats in the bathroom or other areas that are often wet or slippery.  Do not use   any products that contain nicotine or tobacco, such as cigarettes and e-cigarettes. If you need help quitting, ask your health care provider.  Avoid alcohol or limit alcohol intake to no more than 1 drink a day for nonpregnant women and 2 drinks a day for men. One drink equals 12 oz of beer, 5 oz of wine, or 1 oz of hard liquor.  Keep all follow-up visits as told by your  health care provider. This is important. Contact a health care provider if:  You have not had a bone density screening for osteoporosis and you are: ? A woman, age 41 or older. ? A man, age 59 or older.  You are a postmenopausal woman who has not had a bone density screening for osteoporosis.  You are older than age 92 and you want to know if you should have bone density screening for osteoporosis. Summary  Osteopenia is a loss of thickness (density) inside of the bones. Another name for osteopenia is low bone mass.  Osteopenia is not a disease, but it may increase your risk for a condition that causes the bones to become thin and break more easily (osteoporosis).  You may be at risk for osteopenia if you are older than age 35 or if you are a woman who went through early menopause.  Osteopenia does not cause any symptoms, but it can be diagnosed with a bone density screening test.  Dietary and lifestyle changes are the first treatment for osteopenia. These may lower your risk for osteoporosis. This information is not intended to replace advice given to you by your health care provider. Make sure you discuss any questions you have with your health care provider. Document Released: 11/23/2016 Document Revised: 01/27/2017 Document Reviewed: 11/23/2016 Elsevier Patient Education  2020 [employer].

## 2018-08-30 NOTE — Progress Notes (Signed)
Subjective:    Patient ID: Mckenzie Leonard, female    DOB: May 01, 1950, 68 y.o.   MRN: [id number]   Chief Complaint: medical management of chronic issues   HPI:  1. Essential hypertension No c/o chest pain, sob or headache. Does not check blood pressure at home. BP Readings from Last 3 Encounters:  03/01/18 139/78  11/23/17 137/71  10/25/17 (!) 142/86      2. Hyperlipidemia, unspecified hyperlipidemia type Does no watch diet and does very little exercise  3. Controlled type 2 diabetes mellitus without complication, without long-term current use of insulin ([facility]) Last HGBA1c was 6.1%. her fasting blood sugars are below 150 consistently. She does not check it at home very often.  4. Hypothyroidism, unspecified type No problems that she is aware of. Denies fatigue, hair loss or extreme reactions to temperature  5. Osteopenia, unspecified location Last dexascan was done 08/29/16 with t score of -2.1. she does very little weight bearing exercises.  6. Hypokalemia No c/o lower ext cramping, is currently not on a potassium supplement  7. Vitamin D deficiency Takes vitamin d and calcium supplement daily  8. BMI 36.0-36.9,adult No recent weight chnages    Outpatient Encounter Medications as of 08/30/2018  Medication Sig  . Ascorbic Acid (VITAMIN C) 100 MG tablet Take 100 mg by mouth daily.  Marland Kitchen atorvastatin (LIPITOR) 40 MG tablet Take 1 tablet (40 mg total) by mouth daily.  . Blood Glucose Monitoring Suppl (BLOOD GLUCOSE METER KIT AND SUPPLIES) KIT Dispense based on patient and insurance preference. Use to check BG once daily.  ICD 10 code = E11.9  . calcium-vitamin D (OSCAL WITH D) 500-200 MG-UNIT tablet Take 1 tablet by mouth 2 (two) times daily.  . Cholecalciferol (VITAMIN D) 2000 units CAPS Take 6,000 Units by mouth daily.   Marland Kitchen glucose blood (ONETOUCH ULTRA) test strip Check blood sugars daily Dx E11.9  . ibuprofen (IBU) 800 MG tablet Take 1 tablet (800 mg total) by mouth every 8  (eight) hours as needed.  Marland Kitchen levothyroxine (SYNTHROID, LEVOTHROID) 50 MCG tablet Take 1 tablet (50 mcg total) by mouth daily.  . magnesium gluconate (MAGONATE) 500 MG tablet Take 500 mg by mouth daily.  . Multiple Vitamin (MULTIVITAMIN WITH MINERALS) TABS tablet Take 1 tablet by mouth daily.  Marland Kitchen olmesartan-hydrochlorothiazide (BENICAR HCT) 40-12.5 MG tablet Take 1 tablet by mouth daily.  . TURMERIC PO Take 2 tablets by mouth 2 (two) times daily.      Past Surgical History:  Procedure Laterality Date  . BREAST BIOPSY Right    negative  . CARPAL TUNNEL RELEASE     Right  . CLOSED REDUCTION MANDIBLE  02/20/2012   Procedure: CLOSED REDUCTION MANDIBULAR;  Surgeon: Isac Caddy, DDS;  Location: [facility];  Service: Oral Surgery;  Laterality: Right;  Closed reduction zygonach arch fx  . KNEE CARTILAGE SURGERY Left   . ORIF FACIAL FRACTURE  02/20/2012   Procedure: OPEN REDUCTION INTERNAL FIXATION (ORIF) MULTIPLE FACIAL FRACTURES;  Surgeon: Isac Caddy, DDS;  Location: [facility];  Service: Oral Surgery;  Laterality: Right;  ORIF Rt ZMC (zygomaxillary)  . TOTAL KNEE ARTHROPLASTY Left 10/12/2016   Procedure: TOTAL KNEE ARTHROPLASTY;  Surgeon: Carole Civil, MD;  Location: [facility];  Service: Orthopedics;  Laterality: Left;    Family History  Problem Relation Age of Onset  . Arthritis Mother   . Heart disease Mother        enlarged heart  . Anemia Mother   .  Stroke Mother   . Arthritis Father   . Diabetes Father   . Hypertension Father   . Cancer Sister        breast  . Diabetes Brother   . CAD Brother   . Healthy Daughter   . Healthy Daughter     New complaints: None today  Social history: Is retired but stays busy     Review of Systems  Constitutional: Negative for activity change and appetite change.  HENT: Negative.   Eyes: Negative for pain.  Respiratory: Negative for shortness of breath.   Cardiovascular: Negative for chest pain, palpitations and leg  swelling.  Gastrointestinal: Negative for abdominal pain.  Endocrine: Negative for polydipsia.  Genitourinary: Negative.   Skin: Negative for rash.  Neurological: Negative for dizziness, weakness and headaches.  Hematological: Does not bruise/bleed easily.  Psychiatric/Behavioral: Negative.   All other systems reviewed and are negative.      Objective:   Physical Exam Vitals signs and nursing note reviewed.  Constitutional:      General: She is not in acute distress.    Appearance: Normal appearance. She is well-developed.  HENT:     Head: Normocephalic.     Nose: Nose normal.  Eyes:     Pupils: Pupils are equal, round, and reactive to light.  Neck:     Musculoskeletal: Normal range of motion and neck supple.     Vascular: No carotid bruit or JVD.  Cardiovascular:     Rate and Rhythm: Normal rate and regular rhythm.     Heart sounds: Normal heart sounds.  Pulmonary:     Effort: Pulmonary effort is normal. No respiratory distress.     Breath sounds: Normal breath sounds. No wheezing or rales.  Chest:     Chest wall: No tenderness.  Abdominal:     General: Bowel sounds are normal. There is no distension or abdominal bruit.     Palpations: Abdomen is soft. There is no hepatomegaly, splenomegaly, mass or pulsatile mass.     Tenderness: There is no abdominal tenderness.  Musculoskeletal: Normal range of motion.     Right lower leg: Edema (1+) present.     Left lower leg: Edema (2+ with mild erythema- cool to touch) present.  Lymphadenopathy:     Cervical: No cervical adenopathy.  Skin:    General: Skin is warm and dry.  Neurological:     Mental Status: She is alert and oriented to person, place, and time.     Deep Tendon Reflexes: Reflexes are normal and symmetric.  Psychiatric:        Behavior: Behavior normal.        Thought Content: Thought content normal.        Judgment: Judgment normal.    BP 134/76   Pulse 72   Temp (!) 97.4 F (36.3 C) (Oral)   Ht '5\' 2"'$   (1.575 m)   Wt 200 lb (90.7 kg)   BMI 36.58 kg/m   hgba1c 6.2%     Assessment & Plan:  Mckenzie Leonard comes in today with chief complaint of Medical Management of Chronic Issues   Diagnosis and orders addressed:  1. Essential hypertension Low sodium diet - CMP14+EGFR - olmesartan-hydrochlorothiazide (BENICAR HCT) 40-12.5 MG tablet; Take 1 tablet by mouth daily.  Dispense: 90 tablet; Refill: 1  2.  Mixed hyperlipidemia Low fat diet - atorvastatin (LIPITOR) 40 MG tablet; Take 1 tablet (40 mg total) by mouth daily.  Dispense: 90 tablet; Refill: 1- Lipid panel  3. Controlled type 2 diabetes mellitus without complication, without long-term current use of insulin ([facility]) contineu to watch carbs in diet - Bayer DCA Hb A1c Waived  4. Hypothyroidism, unspecified type - levothyroxine (SYNTHROID) 50 MCG tablet; Take 1 tablet (50 mcg total) by mouth daily.  Dispense: 90 tablet; Refill: 1  5. Osteopenia, unspecified location Weight bearing exercise - DG [facility] DEXA  6. Hypokalemia  7. Vitamin D deficiency Continue daily vitamind and calcium supplement  8. BMI 36.0-36.9,adult Discussed diet and exercise for person with BMI >25 Will recheck weight in 3-6 months   Labs pending Health Maintenance reviewed Diet and exercise encouraged  Follow up plan: 6 months   Mary-Margaret Hassell Done, FNP

## 2018-08-31 LAB — CMP14+EGFR
ALT: 21 IU/L (ref 0–32)
AST: 18 IU/L (ref 0–40)
Albumin/Globulin Ratio: 1.8 (ref 1.2–2.2)
Albumin: 4.7 g/dL (ref 3.8–4.8)
Alkaline Phosphatase: 74 IU/L (ref 39–117)
BUN/Creatinine Ratio: 12 (ref 12–28)
BUN: 11 mg/dL (ref 8–27)
Bilirubin Total: 0.8 mg/dL (ref 0.0–1.2)
CO2: 23 mmol/L (ref 20–29)
Calcium: 9.4 mg/dL (ref 8.7–10.3)
Chloride: 99 mmol/L (ref 96–106)
Creatinine, Ser: 0.89 mg/dL (ref 0.57–1.00)
GFR calc Af Amer: 76 mL/min/{1.73_m2} (ref >59)
GFR calc non Af Amer: 66 mL/min/{1.73_m2} (ref >59)
Globulin, Total: 2.6 g/dL (ref 1.5–4.5)
Glucose: 122 mg/dL — ABNORMAL HIGH (ref 65–99)
Potassium: 4.5 mmol/L (ref 3.5–5.2)
Sodium: 143 mmol/L (ref 134–144)
Total Protein: 7.3 g/dL (ref 6.0–8.5)

## 2018-08-31 LAB — LIPID PANEL
Chol/HDL Ratio: 2.6 ratio (ref 0.0–4.4)
Cholesterol, Total: 113 mg/dL (ref 100–199)
HDL: 43 mg/dL (ref >39)
LDL Calculated: 45 mg/dL (ref 0–99)
Triglycerides: 124 mg/dL (ref 0–149)
VLDL Cholesterol Cal: 25 mg/dL (ref 5–40)

## 2018-09-03 ENCOUNTER — Ambulatory Visit (INDEPENDENT_AMBULATORY_CARE_PROVIDER_SITE_OTHER): Payer: Medicare Other | Admitting: *Deleted

## 2018-09-03 ENCOUNTER — Other Ambulatory Visit: Payer: Self-pay

## 2018-09-03 ENCOUNTER — Encounter: Payer: Self-pay | Admitting: *Deleted

## 2018-09-03 DIAGNOSIS — Z0000 Encounter for general adult medical examination without abnormal findings: Secondary | ICD-10-CM

## 2018-09-03 NOTE — Patient Instructions (Signed)
Preventive Care 70 Years and Older, Female Preventive care refers to lifestyle choices and visits with your health care provider that can promote health and wellness. This includes:  A yearly physical exam. This is also called an annual well check.  Regular dental and eye exams.  Immunizations.  Screening for certain conditions.  Healthy lifestyle choices, such as diet and exercise. What can I expect for my preventive care visit? Physical exam Your health care provider will check:  Height and weight. These may be used to calculate body mass index (BMI), which is a measurement that tells if you are at a healthy weight.  Heart rate and blood pressure.  Your skin for abnormal spots. Counseling Your health care provider may ask you questions about:  Alcohol, tobacco, and drug use.  Emotional well-being.  Home and relationship well-being.  Sexual activity.  Eating habits.  History of falls.  Memory and ability to understand (cognition).  Work and work Statistician.  Pregnancy and menstrual history. What immunizations do I need?  Influenza (flu) vaccine  This is recommended every year. Tetanus, diphtheria, and pertussis (Tdap) vaccine  You may need a Td booster every 10 years. Varicella (chickenpox) vaccine  You may need this vaccine if you have not already been vaccinated. Zoster (shingles) vaccine  You may need this after age 70. Pneumococcal conjugate (PCV13) vaccine  One dose is recommended after age 70. Pneumococcal polysaccharide (PPSV23) vaccine  One dose is recommended after age 70. Measles, mumps, and rubella (MMR) vaccine  You may need at least one dose of MMR if you were born in 1957 or later. You may also need a second dose. Meningococcal conjugate (MenACWY) vaccine  You may need this if you have certain conditions. Hepatitis A vaccine  You may need this if you have certain conditions or if you travel or work in places where you may be exposed  to hepatitis A. Hepatitis B vaccine  You may need this if you have certain conditions or if you travel or work in places where you may be exposed to hepatitis B. Haemophilus influenzae type b (Hib) vaccine  You may need this if you have certain conditions. You may receive vaccines as individual doses or as more than one vaccine together in one shot (combination vaccines). Talk with your health care provider about the risks and benefits of combination vaccines. What tests do I need? Blood tests  Lipid and cholesterol levels. These may be checked every 5 years, or more frequently depending on your overall health.  Hepatitis C test.  Hepatitis B test. Screening  Lung cancer screening. You may have this screening every year starting at age 70 if you have a 30-pack-year history of smoking and currently smoke or have quit within the past 15 years.  Colorectal cancer screening. All adults should have this screening starting at age 70 and continuing until age 15. Your health care provider may recommend screening at age 23 if you are at increased risk. You will have tests every 1-10 years, depending on your results and the type of screening test.  Diabetes screening. This is done by checking your blood sugar (glucose) after you have not eaten for a while (fasting). You may have this done every 1-3 years.  Mammogram. This may be done every 1-2 years. Talk with your health care provider about how often you should have regular mammograms.  BRCA-related cancer screening. This may be done if you have a family history of breast, ovarian, tubal, or peritoneal cancers.  Other tests  Sexually transmitted disease (STD) testing.  Bone density scan. This is done to screen for osteoporosis. You may have this done starting at age 70. Follow these instructions at home: Eating and drinking  Eat a diet that includes fresh fruits and vegetables, whole grains, lean protein, and low-fat dairy products. Limit  your intake of foods with high amounts of sugar, saturated fats, and salt.  Take vitamin and mineral supplements as recommended by your health care provider.  Do not drink alcohol if your health care provider tells you not to drink.  If you drink alcohol: ? Limit how much you have to 0-1 drink a day. ? Be aware of how much alcohol is in your drink. In the [place] one drink equals one 12 oz bottle of beer (355 mL), one 5 oz glass of wine (148 mL), or one 1 oz glass of hard liquor (44 mL). Lifestyle  Take daily care of your teeth and gums.  Stay active. Exercise for at least 30 minutes on 5 or more days each week.  Do not use any products that contain nicotine or tobacco, such as cigarettes, e-cigarettes, and chewing tobacco. If you need help quitting, ask your health care provider.  If you are sexually active, practice safe sex. Use a condom or other form of protection in order to prevent STIs (sexually transmitted infections).  Talk with your health care provider about taking a low-dose aspirin or statin. What's next?  Go to your health care provider once a year for a well check visit.  Ask your health care provider how often you should have your eyes and teeth checked.  Stay up to date on all vaccines. This information is not intended to replace advice given to you by your health care provider. Make sure you discuss any questions you have with your health care provider. Document Released: 03/13/2015 Document Revised: 02/08/2018 Document Reviewed: 02/08/2018 Elsevier Patient Education  2020 [employer].

## 2018-09-03 NOTE — Progress Notes (Signed)
MEDICARE ANNUAL WELLNESS VISIT  09/03/2018  Telephone Visit Disclaimer This Medicare AWV was conducted by telephone due to national recommendations for restrictions regarding the COVID-19 Pandemic (e.g. social distancing).  I verified, using two identifiers, that I am speaking with Mckenzie Leonard or their authorized healthcare agent. I discussed the limitations, risks, security, and privacy concerns of performing an evaluation and management service by telephone and the potential availability of an in-person appointment in the future. The patient expressed understanding and agreed to proceed.   Subjective:  Mckenzie Leonard is a 68 y.o. female patient of [name], [place] who had a Medicare Annual Wellness Visit today via telephone. Mckenzie Leonard is Retired and lives alone. she has 2 children. she reports that she is socially active and does interact with friends/family regularly. she is minimally physically active and enjoys crafting, cooking, yard work and doing IT consultant.  Patient Care Team: [name], FNP as PCP - General (Nurse Practitioner) Melina Schools, OD (Optometry) Carole Civil, MD as Consulting Physician (Orthopedic Surgery)  Advanced Directives 09/03/2018 08/30/2017 11/01/2016 10/12/2016 10/12/2016 10/07/2016 08/29/2016  Does Patient Have a Medical Advance Directive? _0  No No  Would patient like information on creating a medical advance directive? No - Patient declined No - Patient declined - No - Patient declined No - Patient declined No - Patient declined No - Patient declined  Pre-existing out of facility DNR order (yellow form or pink MOST form) - - - - - - -    Hospital Utilization Over the Past 12 Months: # of hospitalizations or ER visits: 0 # of surgeries: 0  Review of Systems    Patient reports that her overall health is better compared to last year.  Patient Reported Readings (BP, Pulse, CBG, Weight, etc) none  Review of  Systems: No complaints  All other systems negative.  Pain Assessment Pain : No/denies pain     Current Medications & Allergies (verified) Allergies as of 09/03/2018      Reactions   Sulfa Antibiotics Anaphylaxis   Statins Other (See Comments)   One of them caused muscle pain   Tetanus Toxoids    Reports flu like symptoms and swelling at injection site only      Medication List       Accurate as of September 03, 2018 10:30 AM. If you have any questions, ask your nurse or doctor.        atorvastatin 40 MG tablet Commonly known as: LIPITOR Take 1 tablet (40 mg total) by mouth daily.   blood glucose meter kit and supplies Kit Dispense based on patient and insurance preference. Use to check BG once daily.  ICD 10 code = E11.9   calcium-vitamin D 500-200 MG-UNIT tablet Commonly known as: OSCAL WITH D Take 1 tablet by mouth 2 (two) times daily.   glucose blood test strip Commonly known as: OneTouch Ultra Check blood sugars daily Dx E11.9   ibuprofen 800 MG tablet Commonly known as: IBU Take 1 tablet (800 mg total) by mouth every 8 (eight) hours as needed.   JOINT HEALTH PO Take by mouth.   levothyroxine 50 MCG tablet Commonly known as: SYNTHROID Take 1 tablet (50 mcg total) by mouth daily.   magnesium gluconate 500 MG tablet Commonly known as: MAGONATE Take 500 mg by mouth daily.   multivitamin with minerals Tabs tablet Take 1 tablet by mouth daily.   olmesartan-hydrochlorothiazide 40-12.5 MG tablet Commonly known as: BENICAR HCT  Take 1 tablet by mouth daily.   TURMERIC PO Take 2 tablets by mouth 2 (two) times daily.   vitamin C 100 MG tablet Take 100 mg by mouth daily.   Vitamin D 50 MCG (2000 UT) Caps Take 6,000 Units by mouth daily.       History (reviewed): Past Medical History:  Diagnosis Date  . Arthritis   . Diabetes mellitus without complication ([place])   . High cholesterol   . Hypertension   . Hypothyroidism   . PONV (postoperative nausea  and vomiting)   . Thyroid disease    Past Surgical History:  Procedure Laterality Date  . BREAST BIOPSY Right    negative  . CARPAL TUNNEL RELEASE     Right  . CLOSED REDUCTION MANDIBLE  02/20/2012   Procedure: CLOSED REDUCTION MANDIBULAR;  Surgeon: Isac Caddy, DDS;  Location: [facility];  Service: Oral Surgery;  Laterality: Right;  Closed reduction zygonach arch fx  . KNEE CARTILAGE SURGERY Left   . ORIF FACIAL FRACTURE  02/20/2012   Procedure: OPEN REDUCTION INTERNAL FIXATION (ORIF) MULTIPLE FACIAL FRACTURES;  Surgeon: Isac Caddy, DDS;  Location: [facility];  Service: Oral Surgery;  Laterality: Right;  ORIF Rt ZMC (zygomaxillary)  . TOTAL KNEE ARTHROPLASTY Left 10/12/2016   Procedure: TOTAL KNEE ARTHROPLASTY;  Surgeon: Carole Civil, MD;  Location: [facility];  Service: Orthopedics;  Laterality: Left;   Family History  Problem Relation Age of Onset  . Arthritis Mother   . Heart disease Mother        enlarged heart  . Anemia Mother   . Stroke Mother   . Arthritis Father   . Diabetes Father   . Hypertension Father   . Cancer Sister        breast  . Diabetes Brother   . CAD Brother   . Healthy Daughter   . Healthy Daughter    Social History   Socioeconomic History  . Marital status: Divorced    Spouse name: Not on file  . Number of children: 2  . Years of education: 10  . Highest education level: GED or equivalent  Occupational History  . Occupation: retired    Comment: Copy  . Financial resource strain: Not hard at all  . Food insecurity    Worry: Never true    Inability: Never true  . Transportation needs    Medical: No    Non-medical: No  Tobacco Use  . Smoking status: Never Smoker  . Smokeless tobacco: Never Used  Substance and Sexual Activity  . Alcohol use: No  . Drug use: No  . Sexual activity: Yes    Birth control/protection: Post-menopausal  Lifestyle  . Physical activity    Days per week: 5 days    Minutes per  session: 10 min  . Stress: Not at all  Relationships  . Social connections    Talks on phone: More than three times a week    Gets together: More than three times a week    Attends religious service: More than 4 times per year    Active member of club or organization: Yes    Attends meetings of clubs or organizations: More than 4 times per year    Relationship status: Divorced  Other Topics Concern  . Not on file  Social History Narrative  . Not on file    Activities of Daily Living In your present state of health, do you have any difficulty performing  the following activities: 09/03/2018  Hearing? N  Vision? N  Difficulty concentrating or making decisions? N  Walking or climbing stairs? N  Dressing or bathing? N  Doing errands, shopping? N  Preparing Food and eating ? N  Using the Toilet? N  In the past six months, have you accidently leaked urine? N  Do you have problems with loss of bowel control? N  Managing your Medications? N  Managing your Finances? N  Housekeeping or managing your Housekeeping? N  Some recent data might be hidden    Patient Literacy How often do you need to have someone help you when you read instructions, pamphlets, or other written materials from your doctor or pharmacy?: 1 - Never What is the last grade level you completed in school?: 10th grade-GED later  Exercise Current Exercise Habits: Home exercise routine, Type of exercise: Other - see comments(chair exercises and PT exercises), Time (Minutes): 10, Frequency (Times/Week): 5, Weekly Exercise (Minutes/Week): 50, Intensity: Mild, Exercise limited by: orthopedic condition(s)  Diet Patient reports consuming 2 meals a day and 2 snack(s) a day Patient reports that her primary diet is: Regular Patient reports that she does have regular access to food.   Depression Screen PHQ 2/9 Scores 09/03/2018 08/30/2018 11/23/2017 08/30/2017 05/23/2017 11/22/2016 09/09/2016  PHQ - 2 Score 0 0 0 0 0 0 0     Fall  Risk Fall Risk  09/03/2018 08/30/2018 11/23/2017 08/30/2017 05/23/2017  Falls in the past year? 0 0 No No No     Objective:  TAMANI DURNEY seemed alert and oriented and she participated appropriately during our telephone visit.  Blood Pressure Weight BMI  BP Readings from Last 3 Encounters:  08/30/18 134/76  03/01/18 139/78  11/23/17 137/71   Wt Readings from Last 3 Encounters:  08/30/18 200 lb (90.7 kg)  03/01/18 199 lb (90.3 kg)  11/23/17 198 lb (89.8 kg)   BMI Readings from Last 1 Encounters:  08/30/18 36.58 kg/m    *Unable to obtain current vital signs, weight, and BMI due to telephone visit type  Hearing/Vision  . Morna did not seem to have difficulty with hearing/understanding during the telephone conversation . Reports that she has not had a formal eye exam by an eye care professional within the past year . Reports that she has not had a formal hearing evaluation within the past year *Unable to fully assess hearing and vision during telephone visit type  Cognitive Function: 6CIT Screen 09/03/2018  What Year? 0 points  What month? 0 points  What time? 0 points  Count back from 20 0 points  Months in reverse 0 points  Repeat phrase 0 points  Total Score 0   (Normal:0-7, Significant for Dysfunction: >8)  Normal Cognitive Function Screening: Yes   Immunization & Health Maintenance Record Immunization History  Administered Date(s) Administered  . Tdap 02/18/2012    Health Maintenance  Topic Date Due  . COLON CANCER SCREENING ANNUAL FOBT  09/09/2017  . DEXA SCAN  08/30/2018  . PNA vac Low Risk Adult (1 of 2 - PCV13) 08/30/2019 (Originally 11/09/2013)  . INFLUENZA VACCINE  09/29/2018  . PAP SMEAR-Modifier  11/23/2018  . HEMOGLOBIN A1C  03/02/2019  . OPHTHALMOLOGY EXAM  08/24/2019  . FOOT EXAM  08/30/2019  . MAMMOGRAM  08/26/2020  . TETANUS/TDAP  02/17/2022  . Hepatitis C Screening  Completed  . COLONOSCOPY  Discontinued       Assessment  This is a routine  wellness examination for Mckenzie Leonard.  Health Maintenance: Due or Overdue Health Maintenance Due  Topic Date Due  . COLON CANCER SCREENING ANNUAL FOBT  09/09/2017  . DEXA SCAN  08/30/2018    Mckenzie Leonard does not need a referral for Community Assistance: Care Management:   no Social Work:    no Prescription Assistance:  no Nutrition/Diabetes Education:  no   Plan:  Personalized Goals Goals Addressed            This Visit's Progress   . DIET - INCREASE WATER INTAKE       Try to drink 6-8 glasses of water daily.      Personalized Health Maintenance & Screening Recommendations  Pneumococcal vaccine  FOBT   Shingles vaccine  Lung Cancer Screening Recommended: no (Low Dose CT Chest recommended if Age 12-80 years, 30 pack-year currently smoking OR have quit w/in past 15 years) Hepatitis C Screening recommended: no HIV Screening recommended: no  Advanced Directives: Written information was not prepared per patient's request.  Referrals & Orders No orders of the defined types were placed in this encounter.   Follow-up Plan . Follow-up with [name], FNP as planned . Consider Prevnar 13 and Shingles vaccines at your next visit with your PCP   I have personally reviewed and noted the following in the patient's chart:   . Medical and social history . Use of alcohol, tobacco or illicit drugs  . Current medications and supplements . Functional ability and status . Nutritional status . Physical activity . Advanced directives . List of other physicians . Hospitalizations, surgeries, and ER visits in previous 12 months . Vitals . Screenings to include cognitive, depression, and falls . Referrals and appointments  In addition, I have reviewed and discussed with Mckenzie Leonard certain preventive protocols, quality metrics, and best practice recommendations. A written personalized care plan for preventive services as well as general preventive health  recommendations is available and can be mailed to the patient at her request.      Otilio Connors G,LPN  03/01/7869

## 2018-10-07 IMAGING — CR DG KNEE 1-2V PORT*L*
2 series · 2 of 2 positions shown · non-contrast
Comparison: Left knee x-rays dated September 08, 2016.

CLINICAL DATA: Left knee arthroplasty.

EXAM:
PORTABLE LEFT KNEE - 1-2 VIEW

[ap]
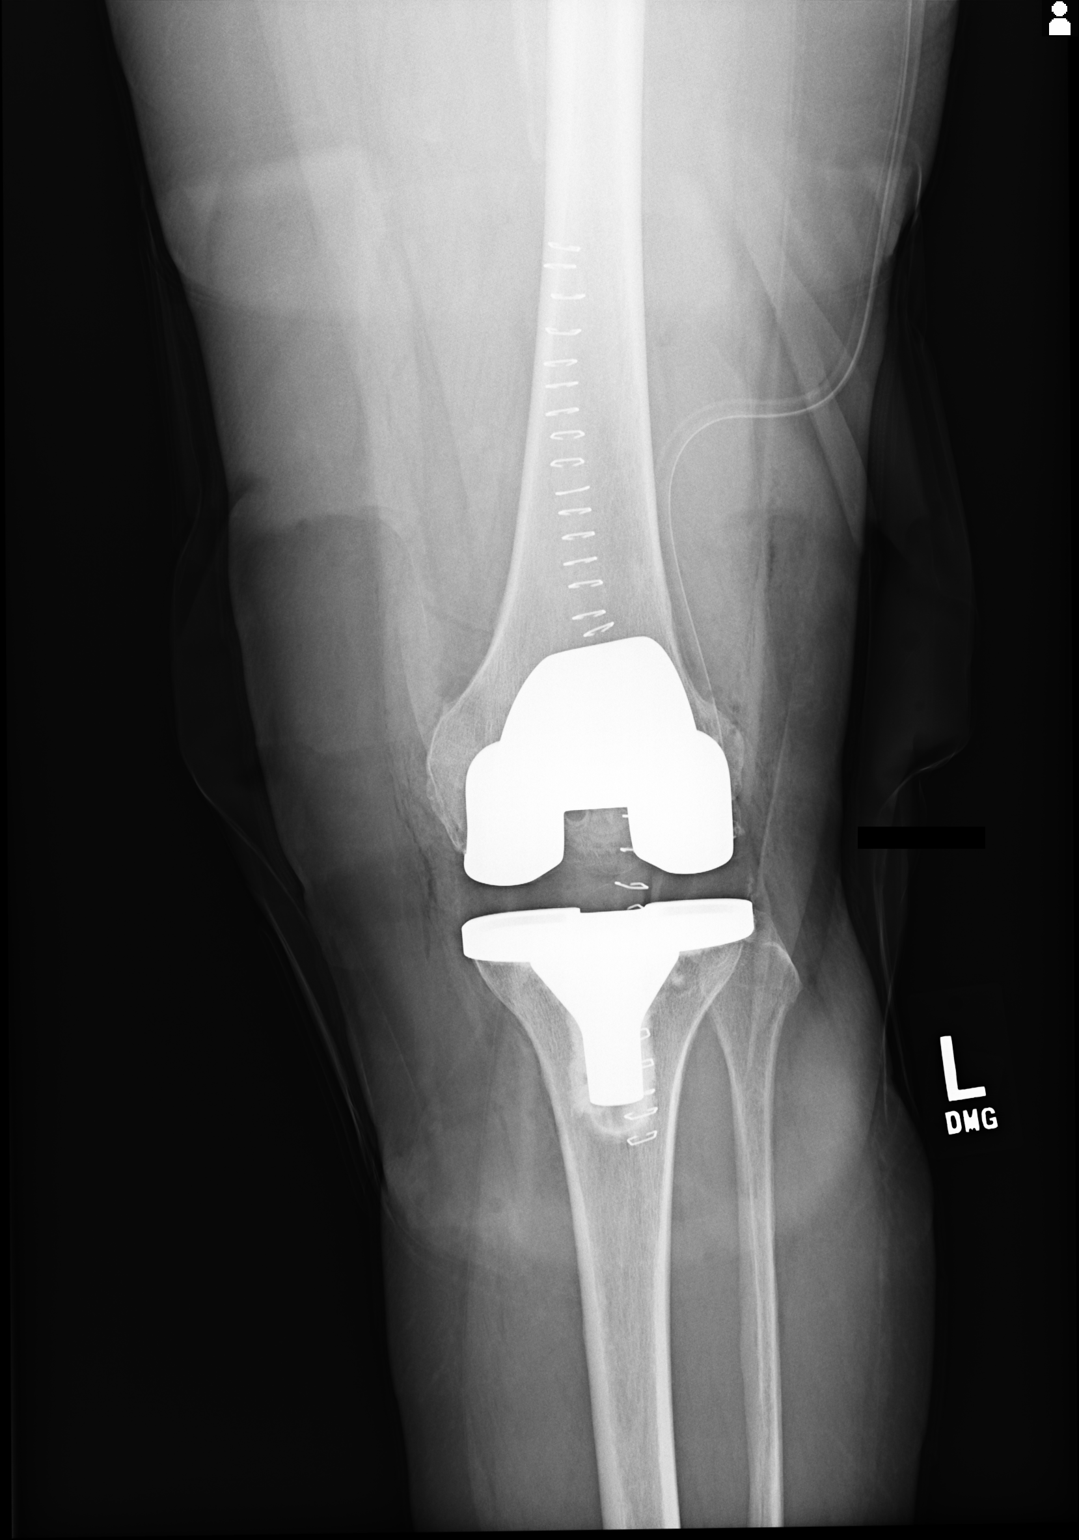

[lat]
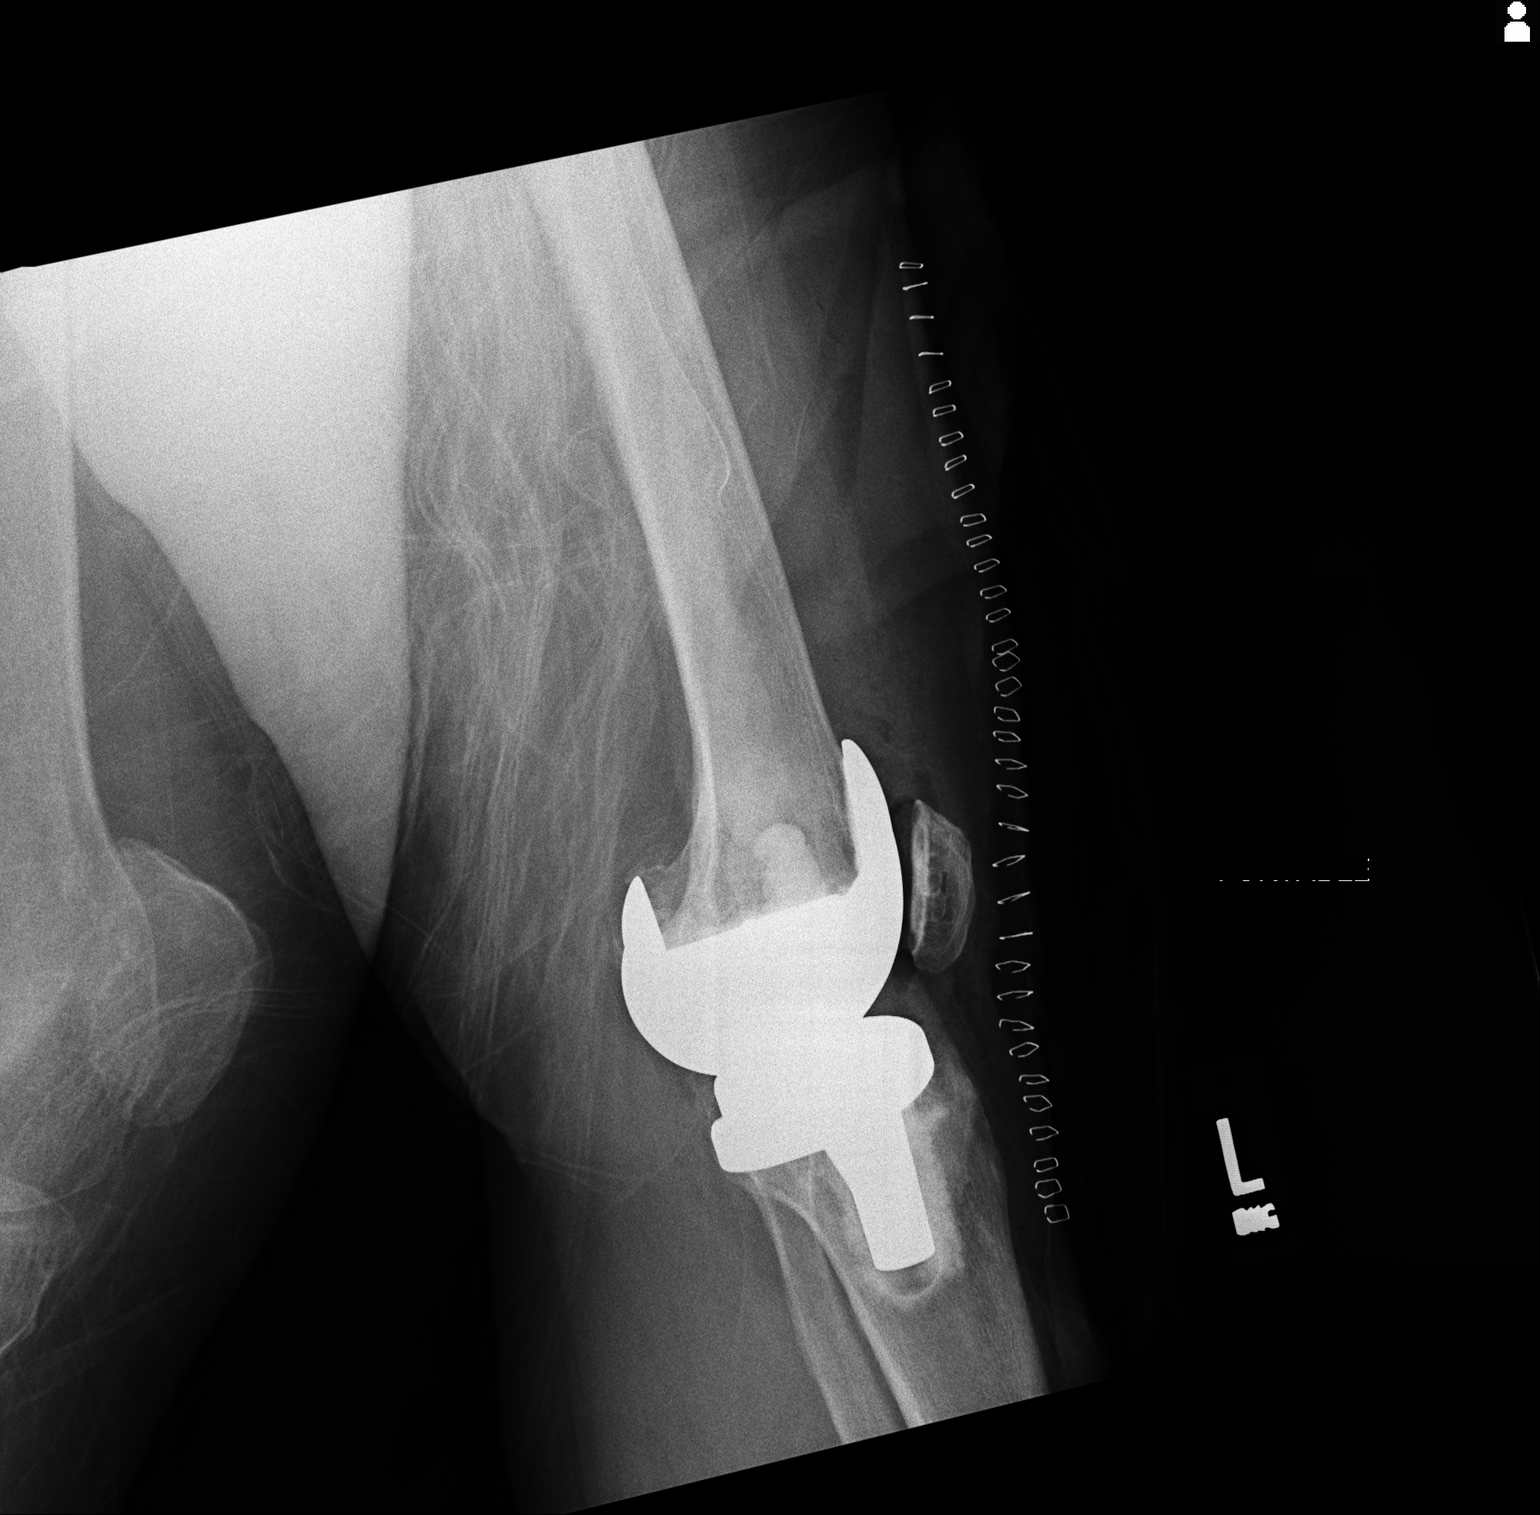

[2 of 2 positions shown; findings below may reference images not displayed]

FINDINGS: Postsurgical changes related to interval left total knee
arthroplasty. Components are well aligned. Expected postsurgical
changes including skin staples, subcutaneous and intra-articular
emphysema, and surgical drain. No acute osseous abnormality.
IMPRESSION: Postsurgical changes related to interval left total knee
arthroplasty without evidence of acute postoperative complication.

## 2018-10-15 ENCOUNTER — Ambulatory Visit: Payer: Self-pay | Admitting: Orthopedic Surgery

## 2018-10-22 ENCOUNTER — Ambulatory Visit: Payer: Medicare Other | Admitting: Orthopedic Surgery

## 2018-10-31 ENCOUNTER — Ambulatory Visit (INDEPENDENT_AMBULATORY_CARE_PROVIDER_SITE_OTHER): Payer: Medicare Other | Admitting: Orthopedic Surgery

## 2018-10-31 ENCOUNTER — Ambulatory Visit: Payer: Medicare Other

## 2018-10-31 ENCOUNTER — Other Ambulatory Visit: Payer: Self-pay

## 2018-10-31 ENCOUNTER — Encounter: Payer: Self-pay | Admitting: Orthopedic Surgery

## 2018-10-31 VITALS — BP 133/64 | HR 96 | Ht 62.0 in | Wt 200.0 lb

## 2018-10-31 DIAGNOSIS — Z96652 Presence of left artificial knee joint: Secondary | ICD-10-CM

## 2018-10-31 NOTE — Progress Notes (Signed)
ANNUAL FOLLOW UP FOR  Left  TKA   Chief Complaint  Patient presents with  . Routine Post Op    left total knee replacement 10/13/2018     HPI: The patient is here for the annual  follow-up x-ray for knee replacement. The patient is not complaining of pain weakness instability or stiffness in the repaired knee.   Review of Systems  Cardiovascular: Positive for leg swelling.    Examination of the left  KNEE  BP 133/64   Pulse 96   Ht 5\' 2"  (1.575 m)   Wt 200 lb (90.7 kg)   BMI 36.58 kg/m   General the patient is normally groomed in no distress  Inspection shows : incision healed nicely without erythema, no tenderness no swelling  Range of motion total range of motion is 115  Stability the knee is stable anterior to posterior as well as medial to lateral  Strength quadriceps strength is normal  Skin no erythema around the skin incision  Neuro: normal sensation in the operative leg   Gait: normal expected gait without cane    Medical decision-making section  X-rays ordered, internal imaging shows (see full dictated report) stable implant with no signs of loosening  Diagnosis  Encounter Diagnosis  Name Primary?  . S/P total knee replacement, left 10/12/2016 Yes     Plan follow-up 1 year repeat x-rays

## 2019-03-04 ENCOUNTER — Encounter: Payer: Self-pay | Admitting: Nurse Practitioner

## 2019-03-04 ENCOUNTER — Ambulatory Visit (INDEPENDENT_AMBULATORY_CARE_PROVIDER_SITE_OTHER): Payer: Medicare Other | Admitting: Nurse Practitioner

## 2019-03-04 DIAGNOSIS — M8580 Other specified disorders of bone density and structure, unspecified site: Secondary | ICD-10-CM

## 2019-03-04 DIAGNOSIS — E876 Hypokalemia: Secondary | ICD-10-CM

## 2019-03-04 DIAGNOSIS — E119 Type 2 diabetes mellitus without complications: Secondary | ICD-10-CM

## 2019-03-04 DIAGNOSIS — E559 Vitamin D deficiency, unspecified: Secondary | ICD-10-CM

## 2019-03-04 DIAGNOSIS — E039 Hypothyroidism, unspecified: Secondary | ICD-10-CM

## 2019-03-04 DIAGNOSIS — E782 Mixed hyperlipidemia: Secondary | ICD-10-CM

## 2019-03-04 DIAGNOSIS — I10 Essential (primary) hypertension: Secondary | ICD-10-CM

## 2019-03-04 DIAGNOSIS — Z6832 Body mass index (BMI) 32.0-32.9, adult: Secondary | ICD-10-CM

## 2019-03-04 MED ORDER — ATORVASTATIN CALCIUM 40 MG PO TABS: 40.0000 mg | ORAL_TABLET | Freq: Every day | ORAL | 1 refills | Status: DC

## 2019-03-04 MED ORDER — LEVOTHYROXINE SODIUM 50 MCG PO TABS: 50.0000 ug | ORAL_TABLET | Freq: Every day | ORAL | 1 refills | Status: DC

## 2019-03-04 MED ORDER — OLMESARTAN MEDOXOMIL-HCTZ 40-12.5 MG PO TABS: 1.0000 | ORAL_TABLET | Freq: Every day | ORAL | 1 refills | Status: DC

## 2019-03-04 NOTE — Progress Notes (Signed)
Virtual Visit via telephone Note Due to COVID-19 pandemic this visit was conducted virtually. This visit type was conducted due to national recommendations for restrictions regarding the COVID-19 Pandemic (e.g. social distancing, sheltering in place) in an effort to limit this patient's exposure and mitigate transmission in our community. All issues noted in this document were discussed and addressed.  A physical exam was not performed with this format.  I connected with Mckenzie Leonard on 03/04/19 at 8:15 by telephone and verified that I am speaking with the correct person using two identifiers. Mckenzie Leonard is currently located at home and no one is currently with  her during visit. The provider, Mary-Margaret Hassell Done, FNP is located in their office at time of visit.  I discussed the limitations, risks, security and privacy concerns of performing an evaluation and management service by telephone and the availability of in person appointments. I also discussed with the patient that there may be a patient responsible charge related to this service. The patient expressed understanding and agreed to proceed.   History and Present Illness:   Chief Complaint: Medical Management of Chronic Issues    HPI:  1. Essential hypertension No c/o chest pain, sob or headache. Does not check blood pressure at home.  BP Readings from Last 3 Encounters:  10/31/18 133/64  08/30/18 134/76  03/01/18 139/78     2. Hyperlipidemia, unspecified hyperlipidemia type Does not watch diet and does exercise daily. Lab Results  Component Value Date   CHOL 113 08/30/2018   HDL 43 08/30/2018   LDLCALC 45 08/30/2018   TRIG 124 08/30/2018   CHOLHDL 2.6 08/30/2018     3. Hypokalemia Denies any lower ext cramping  4. Controlled type 2 diabetes mellitus without complication, without long-term current use of insulin (HCC) fastingblood sugar around 140-150. deneis any symptoms of  Low blood sugars. Lab Results    Component Value Date   HGBA1C 6.2 08/30/2018     5. Hypothyroidism, unspecified type No problems that aware of  6. Osteopenia, unspecified location Last  One was in July 2018 wit t score of -2.1. takes daily calcium and vitamin d supplement.  7. Vitamin D deficiency Takes daily supplement  8. BMI 32.0-32.9,adult No recent weight changes Wt Readings from Last 3 Encounters:  10/31/18 200 lb (90.7 kg)  08/30/18 200 lb (90.7 kg)  03/01/18 199 lb (90.3 kg)   BMI Readings from Last 3 Encounters:  10/31/18 36.58 kg/m  08/30/18 36.58 kg/m  03/01/18 36.40 kg/m       Outpatient Encounter Medications as of 03/04/2019  Medication Sig  . Ascorbic Acid (VITAMIN C) 100 MG tablet Take 100 mg by mouth daily.  Marland Kitchen atorvastatin (LIPITOR) 40 MG tablet Take 1 tablet (40 mg total) by mouth daily.  . Blood Glucose Monitoring Suppl (BLOOD GLUCOSE METER KIT AND SUPPLIES) KIT Dispense based on patient and insurance preference. Use to check BG once daily.  ICD 10 code = E11.9  . calcium-vitamin D (OSCAL WITH D) 500-200 MG-UNIT tablet Take 1 tablet by mouth 2 (two) times daily.  . Cholecalciferol (VITAMIN D) 2000 units CAPS Take 6,000 Units by mouth daily.   Marland Kitchen glucose blood (ONETOUCH ULTRA) test strip Check blood sugars daily Dx E11.9  . levothyroxine (SYNTHROID) 50 MCG tablet Take 1 tablet (50 mcg total) by mouth daily.  . magnesium gluconate (MAGONATE) 500 MG tablet Take 500 mg by mouth daily.  . Misc Natural Products (JOINT HEALTH PO) Take by mouth.  . Multiple  Vitamin (MULTIVITAMIN WITH MINERALS) TABS tablet Take 1 tablet by mouth daily.  . naproxen sodium (ALEVE) 220 MG tablet Take 220 mg by mouth.  . olmesartan-hydrochlorothiazide (BENICAR HCT) 40-12.5 MG tablet Take 1 tablet by mouth daily.  . TURMERIC PO Take 2 tablets by mouth 2 (two) times daily.      Past Surgical History:  Procedure Laterality Date  . BREAST BIOPSY Right    negative  . CARPAL TUNNEL RELEASE     Right  . CLOSED  REDUCTION MANDIBLE  02/20/2012   Procedure: CLOSED REDUCTION MANDIBULAR;  Surgeon: Isac Caddy, DDS;  Location: [facility];  Service: Oral Surgery;  Laterality: Right;  Closed reduction zygonach arch fx  . KNEE CARTILAGE SURGERY Left   . ORIF FACIAL FRACTURE  02/20/2012   Procedure: OPEN REDUCTION INTERNAL FIXATION (ORIF) MULTIPLE FACIAL FRACTURES;  Surgeon: Isac Caddy, DDS;  Location: [facility];  Service: Oral Surgery;  Laterality: Right;  ORIF Rt ZMC (zygomaxillary)  . TOTAL KNEE ARTHROPLASTY Left 10/12/2016   Procedure: TOTAL KNEE ARTHROPLASTY;  Surgeon: Carole Civil, MD;  Location: [facility];  Service: Orthopedics;  Laterality: Left;    Family History  Problem Relation Age of Onset  . Arthritis Mother   . Heart disease Mother        enlarged heart  . Anemia Mother   . Stroke Mother   . Arthritis Father   . Diabetes Father   . Hypertension Father   . Cancer Sister        breast  . Diabetes Brother   . CAD Brother   . Healthy Daughter   . Healthy Daughter     New complaints: None   Social history: Lives by herself. Has daughters that check on her dialy  Controlled substance contract: n/a    Review of Systems  Constitutional: Negative for diaphoresis and weight loss.  Eyes: Negative for blurred vision, double vision and pain.  Respiratory: Negative for shortness of breath.   Cardiovascular: Negative for chest pain, palpitations, orthopnea and leg swelling.  Gastrointestinal: Negative for abdominal pain.  Skin: Negative for rash.  Neurological: Negative for dizziness, sensory change, loss of consciousness, weakness and headaches.  Endo/Heme/Allergies: Negative for polydipsia. Does not bruise/bleed easily.  Psychiatric/Behavioral: Negative for memory loss. The patient does not have insomnia.   All other systems reviewed and are negative.    Observations/Objective: Alert and oriented- answers all questions appropriately No distress    Assessment and  Plan: Mckenzie Leonard comes in today with chief complaint of Medical Management of Chronic Issues   Diagnosis and orders addressed:  1. Essential hypertension Low sodium diet - olmesartan-hydrochlorothiazide (BENICAR HCT) 40-12.5 MG tablet; Take 1 tablet by mouth daily.  Dispense: 90 tablet; Refill: 1 - CMP14+EGFR  2. Mixed hyperlipidemia Low fat diet - atorvastatin (LIPITOR) 40 MG tablet; Take 1 tablet (40 mg total) by mouth daily.  Dispense: 90 tablet; Refill: 1Low fat diet - Lipid panel  3. Hypokalemia Report any lower ext cramping  4. Controlled type 2 diabetes mellitus without complication, without long-term current use of insulin (HCC) Continue diet control of diabates  5. Hypothyroidism, unspecified type - levothyroxine (SYNTHROID) 50 MCG tablet; Take 1 tablet (50 mcg total) by mouth daily.  Dispense: 90 tablet; Refill: 1 - Thyroid Panel With TSH  6. Osteopenia, unspecified location Weight bearing exercise encouraged  7. Vitamin D deficiency continue daily vitamin d supplement  8. BMI 32.0-32.9,adult Discussed diet and exercise for person with BMI >25 Will  recheck weight in 3-6 months     Labs pending Health Maintenance reviewed Diet and exercise encouraged  Follow up plan: 6 months     I discussed the assessment and treatment plan with the patient. The patient was provided an opportunity to ask questions and all were answered. The patient agreed with the plan and demonstrated an understanding of the instructions.   The patient was advised to call back or seek an in-person evaluation if the symptoms worsen or if the condition fails to improve as anticipated.  The above assessment and management plan was discussed with the patient. The patient verbalized understanding of and has agreed to the management plan. Patient is aware to call the clinic if symptoms persist or worsen. Patient is aware when to return to the clinic for a follow-up visit. Patient educated  on when it is appropriate to go to the emergency department.   Time call ended:  8:30  I provided 15 minutes of non-face-to-face time during this encounter.    Mary-Margaret Hassell Done, FNP

## 2019-03-06 ENCOUNTER — Other Ambulatory Visit: Payer: Self-pay

## 2019-03-06 ENCOUNTER — Other Ambulatory Visit: Payer: Medicare Other

## 2019-03-06 DIAGNOSIS — I10 Essential (primary) hypertension: Secondary | ICD-10-CM | POA: Diagnosis not present

## 2019-03-06 DIAGNOSIS — E119 Type 2 diabetes mellitus without complications: Secondary | ICD-10-CM | POA: Diagnosis not present

## 2019-03-06 DIAGNOSIS — E782 Mixed hyperlipidemia: Secondary | ICD-10-CM | POA: Diagnosis not present

## 2019-03-06 DIAGNOSIS — E039 Hypothyroidism, unspecified: Secondary | ICD-10-CM | POA: Diagnosis not present

## 2019-03-06 LAB — BAYER DCA HB A1C WAIVED: HB A1C (BAYER DCA - WAIVED): 6.4 % (ref <7.0)

## 2019-03-07 LAB — CMP14+EGFR
ALT: 20 IU/L (ref 0–32)
AST: 21 IU/L (ref 0–40)
Albumin/Globulin Ratio: 2 (ref 1.2–2.2)
Albumin: 4.8 g/dL (ref 3.8–4.8)
Alkaline Phosphatase: 81 IU/L (ref 39–117)
BUN/Creatinine Ratio: 17 (ref 12–28)
BUN: 13 mg/dL (ref 8–27)
Bilirubin Total: 1.1 mg/dL (ref 0.0–1.2)
CO2: 27 mmol/L (ref 20–29)
Calcium: 9.1 mg/dL (ref 8.7–10.3)
Chloride: 101 mmol/L (ref 96–106)
Creatinine, Ser: 0.78 mg/dL (ref 0.57–1.00)
GFR calc Af Amer: 89 mL/min/{1.73_m2} (ref >59)
GFR calc non Af Amer: 77 mL/min/{1.73_m2} (ref >59)
Globulin, Total: 2.4 g/dL (ref 1.5–4.5)
Glucose: 113 mg/dL — ABNORMAL HIGH (ref 65–99)
Potassium: 4.2 mmol/L (ref 3.5–5.2)
Sodium: 142 mmol/L (ref 134–144)
Total Protein: 7.2 g/dL (ref 6.0–8.5)

## 2019-03-07 LAB — THYROID PANEL WITH TSH
Free Thyroxine Index: 1.7 (ref 1.2–4.9)
T3 Uptake Ratio: 24 % (ref 24–39)
T4, Total: 6.9 ug/dL (ref 4.5–12.0)
TSH: 2.69 u[IU]/mL (ref 0.450–4.500)

## 2019-03-07 LAB — LIPID PANEL
Chol/HDL Ratio: 2.8 ratio (ref 0.0–4.4)
Cholesterol, Total: 112 mg/dL (ref 100–199)
HDL: 40 mg/dL (ref >39)
LDL Chol Calc (NIH): 44 mg/dL (ref 0–99)
Triglycerides: 169 mg/dL — ABNORMAL HIGH (ref 0–149)
VLDL Cholesterol Cal: 28 mg/dL (ref 5–40)

## 2019-04-30 ENCOUNTER — Telehealth: Payer: Self-pay | Admitting: Nurse Practitioner

## 2019-04-30 NOTE — Chronic Care Management (AMB) (Signed)
  Chronic Care Management   Note  04/30/2019 Name: Mckenzie Leonard MRN: [id number] DOB: 22-Sep-1950  Mckenzie Leonard is a 69 y.o. year old female who is a primary care patient of [name], [place]. I reached out to [name] by phone today in response to a referral sent by [name]'s health plan.     [name] was given information about Chronic Care Management services today including:  1. CCM service includes personalized support from designated clinical staff supervised by her physician, including individualized plan of care and coordination with other care providers 2. 24/7 contact phone numbers for assistance for urgent and routine care needs. 3. Service will only be billed when office clinical staff spend 20 minutes or more in a month to coordinate care. 4. Only one practitioner may furnish and bill the service in a calendar month. 5. The patient may stop CCM services at any time (effective at the end of the month) by phone call to the office staff. 6. The patient will be responsible for cost sharing (co-pay) of up to 20% of the service fee (after annual deductible is met).  Patient did not agree to enrollment in care management services and does not wish to consider at this time.  Follow up plan: The patient has been provided with contact information for the care management team and has been advised to call with any health related questions or concerns.   Noreene Larsson, [place], [place], [place] [address] Direct Dial: [phone] [email or web address] Website: [email or web address]

## 2019-04-30 NOTE — Chronic Care Management (AMB) (Signed)
  Chronic Care Management   Outreach Note  04/30/2019 Name: Mckenzie Leonard MRN: [id number] DOB: 09-07-1950  Mckenzie Leonard is a 69 y.o. year old female who is a primary care patient of Mckenzie Leonard, Mckenzie Leonard. I reached out to Mckenzie Leonard by phone today in response to a referral sent by Mckenzie Leonard's health plan.     An unsuccessful telephone outreach was attempted today. The patient was referred to the case management team for assistance with care management and care coordination.   Follow Up Plan: A HIPPA compliant phone message was left for the patient providing contact information and requesting a return call.  The care management team will reach out to the patient again over the next 7 days.  If patient returns call to provider office, please advise to call Mckenzie Leonard at Mckenzie Leonard, Mckenzie Leonard, Mckenzie Leonard, Mckenzie Leonard [address] Direct Dial: [phone] [email or web address]: [email or web address]

## 2019-08-21 ENCOUNTER — Other Ambulatory Visit (HOSPITAL_COMMUNITY): Payer: Self-pay | Admitting: Nurse Practitioner

## 2019-08-21 DIAGNOSIS — Z1231 Encounter for screening mammogram for malignant neoplasm of breast: Secondary | ICD-10-CM

## 2019-08-30 ENCOUNTER — Other Ambulatory Visit: Payer: Self-pay

## 2019-08-30 ENCOUNTER — Ambulatory Visit (HOSPITAL_COMMUNITY)
Admission: RE | Admit: 2019-08-30 | Discharge: 2019-08-30 | Disposition: A | Discharge status: Home / Self Care | Payer: Medicare Other | Source: Ambulatory Visit | Attending: Nurse Practitioner | Admitting: Nurse Practitioner

## 2019-08-30 DIAGNOSIS — Z1231 Encounter for screening mammogram for malignant neoplasm of breast: Secondary | ICD-10-CM | POA: Insufficient documentation

## 2019-09-03 ENCOUNTER — Encounter: Payer: Self-pay | Admitting: Nurse Practitioner

## 2019-09-03 ENCOUNTER — Other Ambulatory Visit: Payer: Medicare Other

## 2019-09-03 ENCOUNTER — Ambulatory Visit (INDEPENDENT_AMBULATORY_CARE_PROVIDER_SITE_OTHER): Payer: Medicare Other | Admitting: Nurse Practitioner

## 2019-09-03 ENCOUNTER — Other Ambulatory Visit: Payer: Self-pay

## 2019-09-03 VITALS — BP 133/77 | HR 72 | Temp 96.9°F | Resp 20 | Ht 62.0 in | Wt 204.0 lb

## 2019-09-03 DIAGNOSIS — R609 Edema, unspecified: Secondary | ICD-10-CM

## 2019-09-03 DIAGNOSIS — E876 Hypokalemia: Secondary | ICD-10-CM | POA: Diagnosis not present

## 2019-09-03 DIAGNOSIS — I10 Essential (primary) hypertension: Secondary | ICD-10-CM | POA: Diagnosis not present

## 2019-09-03 DIAGNOSIS — E782 Mixed hyperlipidemia: Secondary | ICD-10-CM | POA: Diagnosis not present

## 2019-09-03 DIAGNOSIS — R600 Localized edema: Secondary | ICD-10-CM

## 2019-09-03 DIAGNOSIS — E039 Hypothyroidism, unspecified: Secondary | ICD-10-CM | POA: Diagnosis not present

## 2019-09-03 DIAGNOSIS — I739 Peripheral vascular disease, unspecified: Secondary | ICD-10-CM

## 2019-09-03 DIAGNOSIS — E119 Type 2 diabetes mellitus without complications: Secondary | ICD-10-CM

## 2019-09-03 DIAGNOSIS — E559 Vitamin D deficiency, unspecified: Secondary | ICD-10-CM

## 2019-09-03 DIAGNOSIS — M8588 Other specified disorders of bone density and structure, other site: Secondary | ICD-10-CM

## 2019-09-03 LAB — BAYER DCA HB A1C WAIVED: HB A1C (BAYER DCA - WAIVED): 6.2 % (ref <7.0)

## 2019-09-03 MED ORDER — OLMESARTAN MEDOXOMIL 40 MG PO TABS: 40.0000 mg | ORAL_TABLET | Freq: Every day | ORAL | 1 refills | Status: DC

## 2019-09-03 MED ORDER — LEVOTHYROXINE SODIUM 50 MCG PO TABS: 50.0000 ug | ORAL_TABLET | Freq: Every day | ORAL | 1 refills | Status: DC

## 2019-09-03 MED ORDER — FUROSEMIDE 20 MG PO TABS: 20.0000 mg | ORAL_TABLET | Freq: Every day | ORAL | 3 refills | Status: DC

## 2019-09-03 MED ORDER — ATORVASTATIN CALCIUM 40 MG PO TABS: 40.0000 mg | ORAL_TABLET | Freq: Every day | ORAL | 1 refills | Status: DC

## 2019-09-03 NOTE — Progress Notes (Signed)
Subjective:    Patient ID: Mckenzie Leonard, female    DOB: [birth year], 69 y.o.   MRN: [id number]   Chief Complaint: Medical Management of Chronic Issues    HPI:  1. Essential hypertension No c/o chest pain, sob or headache. Does not check blood pressure at  Home. BP Readings from Last 3 Encounters:  09/03/19 133/77  10/31/18 133/64  08/30/18 134/76     2. Controlled type 2 diabetes mellitus without complication, without long-term current use of insulin (HCC) Fasting blood sugars 120-150. She does not watch diet very closely. Denies any symptoms of low blood sugars. Lab Results  Component Value Date   HGBA1C 6.4 03/06/2019     3. Mixed hyperlipidemia Does not watch diet. She says the only exercise she does is with her legs. Lab Results  Component Value Date   CHOL 112 03/06/2019   HDL 40 03/06/2019   LDLCALC 44 03/06/2019   TRIG 169 (H) 03/06/2019   CHOLHDL 2.8 03/06/2019     4. Hypokalemia Has occasional cramps in left lower leg. If she stretches real good in the mornings then she is good. Lab Results  Component Value Date   K 4.2 03/06/2019     5. Hypothyroidism (acquired) No problems that she is aware of.  6. Vitamin D deficiency Takes daily vitamind supplement  7. Osteopenia of lumbar spine Last dexascan was over 2 years ago. Was suppose ot be repeated today but machine is not working right now.  8. BMI 36.0-36.9,adult Weight I sup 4lbs from previous visit Wt Readings from Last 3 Encounters:  09/03/19 204 lb (92.5 kg)  10/31/18 200 lb (90.7 kg)  08/30/18 200 lb (90.7 kg)   BMI Readings from Last 3 Encounters:  09/03/19 37.31 kg/m  10/31/18 36.58 kg/m  08/30/18 36.58 kg/m       Outpatient Encounter Medications as of 09/03/2019  Medication Sig  . Ascorbic Acid (VITAMIN C) 100 MG tablet Take 100 mg by mouth daily.  Marland Kitchen atorvastatin (LIPITOR) 40 MG tablet Take 1 tablet (40 mg total) by mouth daily.  . Blood Glucose Monitoring Suppl (BLOOD  GLUCOSE METER KIT AND SUPPLIES) KIT Dispense based on patient and insurance preference. Use to check BG once daily.  ICD 10 code = E11.9  . calcium-vitamin D (OSCAL WITH D) 500-200 MG-UNIT tablet Take 1 tablet by mouth 2 (two) times daily.  . Cholecalciferol (VITAMIN D) 2000 units CAPS Take 6,000 Units by mouth daily.   Marland Kitchen glucose blood (ONETOUCH ULTRA) test strip Check blood sugars daily Dx E11.9  . levothyroxine (SYNTHROID) 50 MCG tablet Take 1 tablet (50 mcg total) by mouth daily.  . magnesium gluconate (MAGONATE) 500 MG tablet Take 500 mg by mouth daily.  . Misc Natural Products (JOINT HEALTH PO) Take by mouth.  . Multiple Vitamin (MULTIVITAMIN WITH MINERALS) TABS tablet Take 1 tablet by mouth daily.  . naproxen sodium (ALEVE) 220 MG tablet Take 220 mg by mouth.  . olmesartan-hydrochlorothiazide (BENICAR HCT) 40-12.5 MG tablet Take 1 tablet by mouth daily.  . TURMERIC PO Take 2 tablets by mouth 2 (two) times daily.      Past Surgical History:  Procedure Laterality Date  . BREAST BIOPSY Right    negative  . CARPAL TUNNEL RELEASE     Right  . CLOSED REDUCTION MANDIBLE  02/20/2012   Procedure: CLOSED REDUCTION MANDIBULAR;  Surgeon: Isac Caddy, DDS;  Location: [facility];  Service: Oral Surgery;  Laterality: Right;  Closed reduction zygonach arch  fx  . KNEE CARTILAGE SURGERY Left   . ORIF FACIAL FRACTURE  02/20/2012   Procedure: OPEN REDUCTION INTERNAL FIXATION (ORIF) MULTIPLE FACIAL FRACTURES;  Surgeon: Isac Caddy, DDS;  Location: [facility];  Service: Oral Surgery;  Laterality: Right;  ORIF Rt ZMC (zygomaxillary)  . TOTAL KNEE ARTHROPLASTY Left 10/12/2016   Procedure: TOTAL KNEE ARTHROPLASTY;  Surgeon: Carole Civil, MD;  Location: [facility];  Service: Orthopedics;  Laterality: Left;    Family History  Problem Relation Age of Onset  . Arthritis Mother   . Heart disease Mother        enlarged heart  . Anemia Mother   . Stroke Mother   . Arthritis Father   . Diabetes  Father   . Hypertension Father   . Cancer Sister        breast  . Diabetes Brother   . CAD Brother   . Healthy Daughter   . Healthy Daughter     New complaints: Had [employer] came and tested her for PAD- mild bil lower ext.  Social history: Lives by herself. Her daughters check on her daily  Controlled substance contract: n/a    Review of Systems  Constitutional: Negative for diaphoresis.  Eyes: Negative for pain.  Respiratory: Negative for shortness of breath.   Cardiovascular: Negative for chest pain, palpitations and leg swelling.  Gastrointestinal: Negative for abdominal pain.  Endocrine: Negative for polydipsia.  Skin: Negative for rash.  Neurological: Negative for dizziness, weakness and headaches.  Hematological: Does not bruise/bleed easily.  All other systems reviewed and are negative.      Objective:   Physical Exam Vitals and nursing note reviewed.  Constitutional:      General: She is not in acute distress.    Appearance: Normal appearance. She is well-developed.  HENT:     Head: Normocephalic.     Nose: Nose normal.  Eyes:     Pupils: Pupils are equal, round, and reactive to light.  Neck:     Vascular: No carotid bruit or JVD.  Cardiovascular:     Rate and Rhythm: Normal rate and regular rhythm.     Heart sounds: Normal heart sounds.  Pulmonary:     Effort: Pulmonary effort is normal. No respiratory distress.     Breath sounds: Normal breath sounds. No wheezing or rales.  Chest:     Chest wall: No tenderness.  Abdominal:     General: Bowel sounds are normal. There is no distension or abdominal bruit.     Palpations: Abdomen is soft. There is no hepatomegaly, splenomegaly, mass or pulsatile mass.     Tenderness: There is no abdominal tenderness.  Musculoskeletal:        General: Normal range of motion.     Cervical back: Normal range of motion and neck supple.     Right lower leg: Edema (1+) present.     Left lower leg: Edema (1+)  present.  Lymphadenopathy:     Cervical: No cervical adenopathy.  Skin:    General: Skin is warm and dry.  Neurological:     Mental Status: She is alert and oriented to person, place, and time.     Deep Tendon Reflexes: Reflexes are normal and symmetric.  Psychiatric:        Behavior: Behavior normal.        Thought Content: Thought content normal.        Judgment: Judgment normal.   BP 133/77   Pulse 72  Temp (!) 96.9 F (36.1 C) (Temporal)   Resp 20   Ht '5\' 2"'$  (1.575 m)   Wt 204 lb (92.5 kg)   SpO2 97%   BMI 37.31 kg/m   hgba1c 6.2%        Assessment & Plan:  LANYLA COSTELLO comes in today with chief complaint of Medical Management of Chronic Issues   Diagnosis and orders addressed:  1. Essential hypertension Low sodium diet - CBC with Differential/Platelet - CMP14+EGFR - olmesartan (BENICAR) 40 MG tablet; Take 1 tablet (40 mg total) by mouth daily.  Dispense: 90 tablet; Refill: 1  2. Controlled type 2 diabetes mellitus without complication, without long-term current use of insulin (HCC) Continue to watch carbs in diet - Bayer DCA Hb A1c Waived - Microalbumin / creatinine urine ratio  3. Mixed hyperlipidemia Low fat diet - Lipid panel - atorvastatin (LIPITOR) 40 MG tablet; Take 1 tablet (40 mg total) by mouth daily.  Dispense: 90 tablet; Refill: 1  4. Hypokalemia Labs pending  5. Hypothyroidism (acquired) Labs pending - levothyroxine (SYNTHROID) 50 MCG tablet; Take 1 tablet (50 mcg total) by mouth daily.  Dispense: 90 tablet; Refill: 1 - Thyroid Panel With TSH  6. Vitamin D deficiency Continue daily vitamin d supplement  7. Osteopenia of lumbar spine Wants to wait and have dexascan when our machine is up and running  8. Morbid obesity ([place]) Discussed diet and exercise for person with BMI >25 Will recheck weight in 3-6 months  9. Peripheral edema Changed benicar/Hctz to plain benicar and added lasix Elevate legs when sitting - furosemide  (LASIX) 20 MG tablet; Take 1 tablet (20 mg total) by mouth daily.  Dispense: 30 tablet; Refill: 3  10. MIld PAD Add baby aspirin to meds  Labs pending Health Maintenance reviewed Diet and exercise encouraged  Follow up plan: 3 months   Mary-Margaret Hassell Done, FNP

## 2019-09-03 NOTE — Patient Instructions (Signed)

## 2019-09-04 LAB — CBC WITH DIFFERENTIAL/PLATELET
Basophils Absolute: 0.1 10*3/uL (ref 0.0–0.2)
Basos: 1 %
EOS (ABSOLUTE): 0.2 10*3/uL (ref 0.0–0.4)
Eos: 3 %
Hematocrit: 39.8 % (ref 34.0–46.6)
Hemoglobin: 13.2 g/dL (ref 11.1–15.9)
Immature Grans (Abs): 0 10*3/uL (ref 0.0–0.1)
Immature Granulocytes: 0 %
Lymphocytes Absolute: 1.7 10*3/uL (ref 0.7–3.1)
Lymphs: 25 %
MCH: 29.4 pg (ref 26.6–33.0)
MCHC: 33.2 g/dL (ref 31.5–35.7)
MCV: 89 fL (ref 79–97)
Monocytes Absolute: 0.6 10*3/uL (ref 0.1–0.9)
Monocytes: 8 %
Neutrophils Absolute: 4.2 10*3/uL (ref 1.4–7.0)
Neutrophils: 63 %
Platelets: 257 10*3/uL (ref 150–450)
RBC: 4.49 x10E6/uL (ref 3.77–5.28)
RDW: 14.4 % (ref 11.7–15.4)
WBC: 6.7 10*3/uL (ref 3.4–10.8)

## 2019-09-04 LAB — THYROID PANEL WITH TSH
Free Thyroxine Index: 1.7 (ref 1.2–4.9)
T3 Uptake Ratio: 27 % (ref 24–39)
T4, Total: 6.2 ug/dL (ref 4.5–12.0)
TSH: 2.31 u[IU]/mL (ref 0.450–4.500)

## 2019-09-04 LAB — MICROALBUMIN / CREATININE URINE RATIO
Creatinine, Urine: 37.2 mg/dL
Microalb/Creat Ratio: <8 mg/g creat (ref 0–29)
Microalbumin, Urine: <3 ug/mL

## 2019-09-04 LAB — CMP14+EGFR
ALT: 23 IU/L (ref 0–32)
AST: 21 IU/L (ref 0–40)
Albumin/Globulin Ratio: 1.9 (ref 1.2–2.2)
Albumin: 4.6 g/dL (ref 3.8–4.8)
Alkaline Phosphatase: 87 IU/L (ref 48–121)
BUN/Creatinine Ratio: 15 (ref 12–28)
BUN: 12 mg/dL (ref 8–27)
Bilirubin Total: 1 mg/dL (ref 0.0–1.2)
CO2: 24 mmol/L (ref 20–29)
Calcium: 9.1 mg/dL (ref 8.7–10.3)
Chloride: 101 mmol/L (ref 96–106)
Creatinine, Ser: 0.79 mg/dL (ref 0.57–1.00)
GFR calc Af Amer: 88 mL/min/{1.73_m2} (ref >59)
GFR calc non Af Amer: 76 mL/min/{1.73_m2} (ref >59)
Globulin, Total: 2.4 g/dL (ref 1.5–4.5)
Glucose: 111 mg/dL — ABNORMAL HIGH (ref 65–99)
Potassium: 4.1 mmol/L (ref 3.5–5.2)
Sodium: 143 mmol/L (ref 134–144)
Total Protein: 7 g/dL (ref 6.0–8.5)

## 2019-09-04 LAB — LIPID PANEL
Chol/HDL Ratio: 2.6 ratio (ref 0.0–4.4)
Cholesterol, Total: 108 mg/dL (ref 100–199)
HDL: 42 mg/dL (ref >39)
LDL Chol Calc (NIH): 44 mg/dL (ref 0–99)
Triglycerides: 124 mg/dL (ref 0–149)
VLDL Cholesterol Cal: 22 mg/dL (ref 5–40)

## 2019-09-04 LAB — SPECIMEN STATUS REPORT

## 2019-09-09 ENCOUNTER — Other Ambulatory Visit: Payer: Self-pay | Admitting: Nurse Practitioner

## 2019-09-24 ENCOUNTER — Telehealth: Payer: Self-pay | Admitting: Nurse Practitioner

## 2019-10-02 ENCOUNTER — Ambulatory Visit (INDEPENDENT_AMBULATORY_CARE_PROVIDER_SITE_OTHER): Payer: Medicare Other

## 2019-10-02 DIAGNOSIS — Z0000 Encounter for general adult medical examination without abnormal findings: Secondary | ICD-10-CM | POA: Diagnosis not present

## 2019-10-02 NOTE — Progress Notes (Signed)
MEDICARE ANNUAL WELLNESS VISIT  10/02/2019  Telephone Visit Disclaimer This Medicare AWV was conducted by telephone due to national recommendations for restrictions regarding the COVID-19 Pandemic (e.g. social distancing).  I verified, using two identifiers, that I am speaking with Mckenzie Leonard or their authorized healthcare agent. I discussed the limitations, risks, security, and privacy concerns of performing an evaluation and management service by telephone and the potential availability of an in-person appointment in the future. The patient expressed understanding and agreed to proceed.   Subjective:  Mckenzie Leonard is a 69 y.o. female patient of [name], Roff who had a Medicare Annual Wellness Visit today via telephone. Babbie was located at her home and I was located in the office at [facility] during this telephone call. She lives locally in nearby [place] and lives alone. She is divorced and her ex husband passed away a couple years ago. They have two daughters together and both live nearby. She is retired and worked at [employer]. She enjoys reading and crafting in her free time. She refuses any immunizations such as flu, pneumonia, and Covid vaccines. She is allergic to tetanus.   Patient Care Team: [name], FNP as PCP - General (Nurse Practitioner) Melina Schools, OD (Optometry) Carole Civil, MD as Consulting Physician (Orthopedic Surgery)  Advanced Directives 10/02/2019 09/03/2018 08/30/2017 11/01/2016 10/12/2016 10/12/2016 10/07/2016  Does Patient Have a Medical Advance Directive? No No No No No No No  Would patient like information on creating a medical advance directive? No - Patient declined No - Patient declined No - Patient declined - No - Patient declined No - Patient declined No - Patient declined  Pre-existing out of facility DNR order (yellow form or pink MOST form) - - - - - - -    Hospital Utilization Over the Past  12 Months: # of hospitalizations or ER visits: 0 # of surgeries: 0  Review of Systems    Patient reports that her overall health is better compared to last year.  History obtained from chart review  Patient Reported Readings (BP, Pulse, CBG, Weight, etc) none  Pain Assessment Pain : No/denies pain     Current Medications & Allergies (verified) Allergies as of 10/02/2019      Reactions   Sulfa Antibiotics Anaphylaxis   Statins Other (See Comments)   One of them caused muscle pain   Tetanus Toxoids    Reports flu like symptoms and swelling at injection site only      Medication List       Accurate as of October 02, 2019 10:22 AM. If you have any questions, ask your nurse or doctor.        atorvastatin 40 MG tablet Commonly known as: LIPITOR Take 1 tablet (40 mg total) by mouth daily.   blood glucose meter kit and supplies Kit Dispense based on patient and insurance preference. Use to check BG once daily.  ICD 10 code = E11.9   calcium-vitamin D 500-200 MG-UNIT tablet Commonly known as: OSCAL WITH D Take 1 tablet by mouth 2 (two) times daily.   furosemide 20 MG tablet Commonly known as: LASIX Take 1 tablet (20 mg total) by mouth daily.   JOINT HEALTH PO Take by mouth.   levothyroxine 50 MCG tablet Commonly known as: SYNTHROID Take 1 tablet (50 mcg total) by mouth daily.   magnesium gluconate 500 MG tablet Commonly known as: MAGONATE Take 500 mg by mouth daily.  multivitamin with minerals Tabs tablet Take 1 tablet by mouth daily.   naproxen sodium 220 MG tablet Commonly known as: ALEVE Take 220 mg by mouth.   olmesartan 40 MG tablet Commonly known as: Benicar Take 1 tablet (40 mg total) by mouth daily.   olmesartan-hydrochlorothiazide 40-12.5 MG tablet Commonly known as: BENICAR HCT Take 1 tablet by mouth daily.   OneTouch Ultra test strip Generic drug: glucose blood Check BS daily dx E11.9   TURMERIC PO Take 2 tablets by mouth 2 (two) times  daily.   vitamin C 100 MG tablet Take 100 mg by mouth daily.   Vitamin D 50 MCG (2000 UT) Caps Take 6,000 Units by mouth daily.       History (reviewed): Past Medical History:  Diagnosis Date  . Arthritis   . Diabetes mellitus without complication ([place]   . High cholesterol   . Hypertension   . Hypothyroidism   . PONV (postoperative nausea and vomiting)   . Thyroid disease    Past Surgical History:  Procedure Laterality Date  . BREAST BIOPSY Right    negative  . CARPAL TUNNEL RELEASE     Right  . CLOSED REDUCTION MANDIBLE  02/20/2012   Procedure: CLOSED REDUCTION MANDIBULAR;  Surgeon: Isac Caddy, DDS;  Location: [facility];  Service: Oral Surgery;  Laterality: Right;  Closed reduction zygonach arch fx  . KNEE CARTILAGE SURGERY Left   . ORIF FACIAL FRACTURE  02/20/2012   Procedure: OPEN REDUCTION INTERNAL FIXATION (ORIF) MULTIPLE FACIAL FRACTURES;  Surgeon: Isac Caddy, DDS;  Location: [facility];  Service: Oral Surgery;  Laterality: Right;  ORIF Rt ZMC (zygomaxillary)  . TOTAL KNEE ARTHROPLASTY Left 10/12/2016   Procedure: TOTAL KNEE ARTHROPLASTY;  Surgeon: Carole Civil, MD;  Location: [facility];  Service: Orthopedics;  Laterality: Left;   Family History  Problem Relation Age of Onset  . Arthritis Mother   . Heart disease Mother        enlarged heart  . Anemia Mother   . Stroke Mother   . Hearing loss Mother   . Arthritis Father   . Diabetes Father   . Hypertension Father   . Cancer Sister        breast  . Diabetes Brother   . CAD Brother   . Healthy Daughter   . Healthy Daughter    Social History   Socioeconomic History  . Marital status: Divorced    Spouse name: Not on file  . Number of children: 2  . Years of education: 10  . Highest education level: GED or equivalent  Occupational History  . Occupation: retired    Comment: Charity fundraiser  Tobacco Use  . Smoking status: Never Smoker  . Smokeless tobacco: Never Used  Vaping Use  . Vaping  Use: Never used  Substance and Sexual Activity  . Alcohol use: No  . Drug use: No  . Sexual activity: Yes    Birth control/protection: Post-menopausal  Other Topics Concern  . Not on file  Social History Narrative  . Not on file   Social Determinants of Health   Financial Resource Strain:   . Difficulty of Paying Living Expenses:   Food Insecurity:   . Worried About Charity fundraiser in the Last Year:   . Arboriculturist in the Last Year:   Transportation Needs:   . Film/video editor (Medical):   Marland Kitchen Lack of Transportation (Non-Medical):   Physical Activity:   . Days of  Exercise per Week:   . Minutes of Exercise per Session:   Stress:   . Feeling of Stress :   Social Connections:   . Frequency of Communication with Friends and Family:   . Frequency of Social Gatherings with Friends and Family:   . Attends Religious Services:   . Active Member of Clubs or Organizations:   . Attends Archivist Meetings:   Marland Kitchen Marital Status:     Activities of Daily Living In your present state of health, do you have any difficulty performing the following activities: 10/02/2019  Hearing? N  Vision? Y  Comment Wears glasses all the time  Difficulty concentrating or making decisions? N  Walking or climbing stairs? N  Dressing or bathing? N  Doing errands, shopping? N  Preparing Food and eating ? N  Using the Toilet? N  In the past six months, have you accidently leaked urine? N  Do you have problems with loss of bowel control? N  Managing your Medications? N  Managing your Finances? N  Housekeeping or managing your Housekeeping? N  Some recent data might be hidden    Patient Education/ Literacy How often do you need to have someone help you when you read instructions, pamphlets, or other written materials from your doctor or pharmacy?: 1 - Never What is the last grade level you completed in school?: Quit in 11th grade but took a home course to receive  diploma  Exercise Current Exercise Habits: The patient does not participate in regular exercise at present, Exercise limited by: None identified  Diet Patient reports consuming 3 meals a day and 2 snack(s) a day Patient reports that her primary diet is: Regular Patient reports that she does have regular access to food.   Depression Screen PHQ 2/9 Scores 10/02/2019 09/03/2019 09/03/2018 08/30/2018 11/23/2017 08/30/2017 05/23/2017  PHQ - 2 Score 0 0 0 0 0 0 0     Fall Risk Fall Risk  10/02/2019 09/03/2019 09/03/2018 08/30/2018 11/23/2017  Falls in the past year? 0 0 0 0 No     Objective:  Mckenzie Leonard seemed alert and oriented and she participated appropriately during our telephone visit.  Blood Pressure Weight BMI  BP Readings from Last 3 Encounters:  09/03/19 133/77  10/31/18 133/64  08/30/18 134/76   Wt Readings from Last 3 Encounters:  09/03/19 204 lb (92.5 kg)  10/31/18 200 lb (90.7 kg)  08/30/18 200 lb (90.7 kg)   BMI Readings from Last 1 Encounters:  09/03/19 37.31 kg/m    *Unable to obtain current vital signs, weight, and BMI due to telephone visit type  Hearing/Vision  . Lama did not seem to have difficulty with hearing/understanding during the telephone conversation . Reports that she has had a formal eye exam by an eye care professional within the past year . Reports that she has not had a formal hearing evaluation within the past year *Unable to fully assess hearing and vision during telephone visit type  Cognitive Function: 6CIT Screen 10/02/2019 09/03/2018  What Year? 0 points 0 points  What month? 0 points 0 points  What time? 0 points 0 points  Count back from 20 0 points 0 points  Months in reverse 0 points 0 points  Repeat phrase 0 points 0 points  Total Score 0 0   (Normal:0-7, Significant for Dysfunction: >8)  Normal Cognitive Function Screening: Yes   Immunization & Health Maintenance Record Immunization History  Administered Date(s) Administered  . Tdap  02/18/2012  Health Maintenance  Topic Date Due  . OPHTHALMOLOGY EXAM  08/24/2019  . FOOT EXAM  08/30/2019  . INFLUENZA VACCINE  09/29/2019  . COVID-19 Vaccine (1) 10/18/2019 (Originally 11/09/1962)  . DEXA SCAN  01/30/2020 (Originally 08/30/2018)  . PNA vac Low Risk Adult (1 of 2 - PCV13) 09/02/2020 (Originally 11/09/2013)  . HEMOGLOBIN A1C  03/05/2020  . COLON CANCER SCREENING ANNUAL FOBT  08/04/2020  . MAMMOGRAM  08/29/2021  . TETANUS/TDAP  02/17/2022  . Hepatitis C Screening  Completed  . COLONOSCOPY  Discontinued       Assessment  This is a routine wellness examination for Mckenzie Leonard.  Health Maintenance: Due or Overdue Health Maintenance Due  Topic Date Due  . OPHTHALMOLOGY EXAM  08/24/2019  . FOOT EXAM  08/30/2019  . INFLUENZA VACCINE  09/29/2019    Mckenzie Leonard does not need a referral for Community Assistance: Care Management:   no Social Work:    no Prescription Assistance:  no Nutrition/Diabetes Education:  no   Plan:  Personalized Goals Goals Addressed   None    Personalized Health Maintenance & Screening Recommendations  Bone densitometry screening  Lung Cancer Screening Recommended: no (Low Dose CT Chest recommended if Age 47-80 years, 30 pack-year currently smoking OR have quit w/in past 15 years) Hepatitis C Screening recommended: Completed HIV Screening recommended: no  Advanced Directives: Written information was not prepared per patient's request.  Referrals & Orders No orders of the defined types were placed in this encounter.   Follow-up Plan . Follow-up with [name], FNP as planned . Schedule for a dexa scan   I have personally reviewed and noted the following in the patient's chart:   . Medical and social history . Use of alcohol, tobacco or illicit drugs  . Current medications and supplements . Functional ability and status . Nutritional status . Physical activity . Advanced directives . List of other  physicians . Hospitalizations, surgeries, and ER visits in previous 12 months . Vitals . Screenings to include cognitive, depression, and falls . Referrals and appointments  In addition, I have reviewed and discussed with Mckenzie Leonard certain preventive protocols, quality metrics, and best practice recommendations. A written personalized care plan for preventive services as well as general preventive health recommendations is available and can be mailed to the patient at her request.      Rolena Infante LPN   03/04/7260

## 2019-10-02 NOTE — Patient Instructions (Signed)
  Mckenzie Leonard , Thank you for taking time to come for your Medicare Wellness Visit. I appreciate your ongoing commitment to your health goals. Please review the following plan we discussed and let me know if I can assist you in the future.   These are the goals we discussed: Goals    . DIET - INCREASE WATER INTAKE     Try to drink 6-8 glasses of water daily.    . Exercise 150 min/wk Moderate Activity    . Exercise 150 minutes per week (moderate activity)     Walk for 30 minutes daily         This is a list of the screening recommended for you and due dates:  Health Maintenance  Topic Date Due  . Eye exam for diabetics  08/24/2019  . Complete foot exam   08/30/2019  . Flu Shot  09/29/2019  . COVID-19 Vaccine (1) 10/18/2019*  . DEXA scan (bone density measurement)  01/30/2020*  . Pneumonia vaccines (1 of 2 - PCV13) 09/02/2020*  . Hemoglobin A1C  03/05/2020  . Stool Blood Test  08/04/2020  . Mammogram  08/29/2021  . Tetanus Vaccine  02/17/2022  .  Hepatitis C: One time screening is recommended by Center for Disease Control  (CDC) for  adults born from [birth year] through 1967.   Completed  . Colon Cancer Screening  Discontinued  *Topic was postponed. The date shown is not the original due date.

## 2019-11-06 ENCOUNTER — Ambulatory Visit: Payer: Medicare Other

## 2019-11-06 ENCOUNTER — Ambulatory Visit (INDEPENDENT_AMBULATORY_CARE_PROVIDER_SITE_OTHER): Payer: Medicare Other | Admitting: Orthopedic Surgery

## 2019-11-06 ENCOUNTER — Encounter: Payer: Self-pay | Admitting: Orthopedic Surgery

## 2019-11-06 ENCOUNTER — Other Ambulatory Visit: Payer: Self-pay

## 2019-11-06 VITALS — BP 147/80 | HR 68 | Ht 63.0 in | Wt 203.0 lb

## 2019-11-06 DIAGNOSIS — Z96652 Presence of left artificial knee joint: Secondary | ICD-10-CM

## 2019-11-06 DIAGNOSIS — M1712 Unilateral primary osteoarthritis, left knee: Secondary | ICD-10-CM

## 2019-11-06 NOTE — Progress Notes (Signed)
Chief Complaint  Patient presents with  . Knee Pain    yearly f/u left TKA- soreness occasionally medially, DOS 10/12/2016   No issues at this time regarding her left total knee other than some medial soreness  She is ambulating well says her knee feels much better than before surgery and it was worth having the surgery  Her incision healed nicely she is walking unsupported there is no limp  Her knee flexion is 115 degrees of full extension good strength and no instability  X-ray shows no loosening or wear  I released her to come back as needed if there are any problems  Chronic problem improved stable at goal minimal low of complications.

## 2019-12-24 ENCOUNTER — Ambulatory Visit (INDEPENDENT_AMBULATORY_CARE_PROVIDER_SITE_OTHER): Payer: Medicare Other

## 2019-12-24 ENCOUNTER — Other Ambulatory Visit: Payer: Self-pay

## 2019-12-24 ENCOUNTER — Ambulatory Visit (INDEPENDENT_AMBULATORY_CARE_PROVIDER_SITE_OTHER): Payer: Medicare Other | Admitting: Nurse Practitioner

## 2019-12-24 ENCOUNTER — Ambulatory Visit: Payer: Medicare Other

## 2019-12-24 ENCOUNTER — Encounter: Payer: Self-pay | Admitting: Nurse Practitioner

## 2019-12-24 VITALS — BP 146/82 | HR 64 | Temp 97.4°F | Resp 20 | Ht 63.0 in | Wt 204.0 lb

## 2019-12-24 DIAGNOSIS — M8588 Other specified disorders of bone density and structure, other site: Secondary | ICD-10-CM

## 2019-12-24 DIAGNOSIS — E559 Vitamin D deficiency, unspecified: Secondary | ICD-10-CM

## 2019-12-24 DIAGNOSIS — Z6832 Body mass index (BMI) 32.0-32.9, adult: Secondary | ICD-10-CM

## 2019-12-24 DIAGNOSIS — I10 Essential (primary) hypertension: Secondary | ICD-10-CM | POA: Diagnosis not present

## 2019-12-24 DIAGNOSIS — E876 Hypokalemia: Secondary | ICD-10-CM | POA: Diagnosis not present

## 2019-12-24 DIAGNOSIS — M8580 Other specified disorders of bone density and structure, unspecified site: Secondary | ICD-10-CM

## 2019-12-24 DIAGNOSIS — E039 Hypothyroidism, unspecified: Secondary | ICD-10-CM

## 2019-12-24 DIAGNOSIS — I739 Peripheral vascular disease, unspecified: Secondary | ICD-10-CM

## 2019-12-24 DIAGNOSIS — E119 Type 2 diabetes mellitus without complications: Secondary | ICD-10-CM | POA: Diagnosis not present

## 2019-12-24 DIAGNOSIS — E782 Mixed hyperlipidemia: Secondary | ICD-10-CM | POA: Diagnosis not present

## 2019-12-24 DIAGNOSIS — Z780 Asymptomatic menopausal state: Secondary | ICD-10-CM | POA: Diagnosis not present

## 2019-12-24 DIAGNOSIS — R609 Edema, unspecified: Secondary | ICD-10-CM

## 2019-12-24 LAB — CBC WITH DIFFERENTIAL/PLATELET
Basophils Absolute: 0.1 10*3/uL (ref 0.0–0.2)
Basos: 1 %
EOS (ABSOLUTE): 0.2 10*3/uL (ref 0.0–0.4)
Eos: 3 %
Hematocrit: 39.3 % (ref 34.0–46.6)
Hemoglobin: 13.1 g/dL (ref 11.1–15.9)
Immature Grans (Abs): 0 10*3/uL (ref 0.0–0.1)
Immature Granulocytes: 0 %
Lymphocytes Absolute: 1.6 10*3/uL (ref 0.7–3.1)
Lymphs: 22 %
MCH: 29.5 pg (ref 26.6–33.0)
MCHC: 33.3 g/dL (ref 31.5–35.7)
MCV: 89 fL (ref 79–97)
Monocytes Absolute: 0.5 10*3/uL (ref 0.1–0.9)
Monocytes: 7 %
Neutrophils Absolute: 4.9 10*3/uL (ref 1.4–7.0)
Neutrophils: 67 %
Platelets: 256 10*3/uL (ref 150–450)
RBC: 4.44 x10E6/uL (ref 3.77–5.28)
RDW: 13.8 % (ref 11.7–15.4)
WBC: 7.3 10*3/uL (ref 3.4–10.8)

## 2019-12-24 LAB — CMP14+EGFR
ALT: 19 IU/L (ref 0–32)
AST: 20 IU/L (ref 0–40)
Albumin/Globulin Ratio: 1.9 (ref 1.2–2.2)
Albumin: 4.6 g/dL (ref 3.7–4.7)
Alkaline Phosphatase: 89 IU/L (ref 44–121)
BUN/Creatinine Ratio: 17 (ref 12–28)
BUN: 14 mg/dL (ref 8–27)
Bilirubin Total: 0.9 mg/dL (ref 0.0–1.2)
CO2: 25 mmol/L (ref 20–29)
Calcium: 9.3 mg/dL (ref 8.7–10.3)
Chloride: 101 mmol/L (ref 96–106)
Creatinine, Ser: 0.83 mg/dL (ref 0.57–1.00)
GFR calc Af Amer: 82 mL/min/{1.73_m2} (ref >59)
GFR calc non Af Amer: 71 mL/min/{1.73_m2} (ref >59)
Globulin, Total: 2.4 g/dL (ref 1.5–4.5)
Glucose: 106 mg/dL — ABNORMAL HIGH (ref 65–99)
Potassium: 4.6 mmol/L (ref 3.5–5.2)
Sodium: 142 mmol/L (ref 134–144)
Total Protein: 7 g/dL (ref 6.0–8.5)

## 2019-12-24 LAB — BAYER DCA HB A1C WAIVED: HB A1C (BAYER DCA - WAIVED): 6.1 % (ref <7.0)

## 2019-12-24 LAB — LIPID PANEL
Chol/HDL Ratio: 2.7 ratio (ref 0.0–4.4)
Cholesterol, Total: 114 mg/dL (ref 100–199)
HDL: 43 mg/dL (ref >39)
LDL Chol Calc (NIH): 51 mg/dL (ref 0–99)
Triglycerides: 108 mg/dL (ref 0–149)
VLDL Cholesterol Cal: 20 mg/dL (ref 5–40)

## 2019-12-24 MED ORDER — OLMESARTAN MEDOXOMIL 40 MG PO TABS: 40.0000 mg | ORAL_TABLET | Freq: Every day | ORAL | 1 refills | Status: DC

## 2019-12-24 MED ORDER — LEVOTHYROXINE SODIUM 50 MCG PO TABS: 50.0000 ug | ORAL_TABLET | Freq: Every day | ORAL | 1 refills | Status: DC

## 2019-12-24 MED ORDER — FUROSEMIDE 20 MG PO TABS: 20.0000 mg | ORAL_TABLET | Freq: Every day | ORAL | 1 refills | Status: DC

## 2019-12-24 MED ORDER — ATORVASTATIN CALCIUM 40 MG PO TABS: 40.0000 mg | ORAL_TABLET | Freq: Every day | ORAL | 1 refills | Status: DC

## 2019-12-24 NOTE — Progress Notes (Signed)
Subjective:    Patient ID: Mckenzie Leonard, female    DOB: [birth year], 69 y.o.   MRN: [id number]   Chief Complaint: Medical Management of Chronic Issues    HPI:  1. Primary hypertension No c/o chest pain, sob or headache. Does check blood pressure at home. Running 509-326 systolic. BP Readings from Last 3 Encounters:  12/24/19 (!) 146/82  11/06/19 (!) 147/80  09/03/19 133/77     2. Mixed hyperlipidemia Does watch diet and is currently  Not doing any exercise. Lab Results  Component Value Date   CHOL 108 09/03/2019   HDL 42 09/03/2019   LDLCALC 44 09/03/2019   TRIG 124 09/03/2019   CHOLHDL 2.6 09/03/2019     3. Controlled type 2 diabetes mellitus without complication, without long-term current use of insulin (HCC) Fasting blood sugars running around 120-140 consistently. sheis currently just doing diet control. Lab Results  Component Value Date   HGBA1C 6.2 09/03/2019     4. Hypokalemia denies any lower ext cramping. Is not on potasium supplement.  5. Hypothyroidism (acquired) No problems that she is aware of. Lab Results  Component Value Date   TSH 2.310 09/03/2019     6. PAD (peripheral artery disease) (HCC) Doing ewll.  7. Peripheral edema Is on lasix and swelling has improved since starting  8. Vitamin D deficiency On daily vitamin d supplement  9. Osteopenia of lumbar spine Last dexascan was done on 09/04/16. Her t score was -2.1. will repeat dexascan today.  10. BMI 32.0-32.9,adult No recent weight changes Wt Readings from Last 3 Encounters:  12/24/19 204 lb (92.5 kg)  11/06/19 203 lb (92.1 kg)  09/03/19 204 lb (92.5 kg)   BMI Readings from Last 3 Encounters:  12/24/19 36.14 kg/m  11/06/19 35.96 kg/m  09/03/19 37.31 kg/m       Outpatient Encounter Medications as of 12/24/2019  Medication Sig  . Ascorbic Acid (VITAMIN C) 100 MG tablet Take 100 mg by mouth daily.  Marland Kitchen atorvastatin (LIPITOR) 40 MG tablet Take 1 tablet (40 mg total) by  mouth daily.  . Blood Glucose Monitoring Suppl (BLOOD GLUCOSE METER KIT AND SUPPLIES) KIT Dispense based on patient and insurance preference. Use to check BG once daily.  ICD 10 code = E11.9  . calcium-vitamin D (OSCAL WITH D) 500-200 MG-UNIT tablet Take 1 tablet by mouth 2 (two) times daily.  . Cholecalciferol (VITAMIN D) 2000 units CAPS Take 6,000 Units by mouth daily.   . furosemide (LASIX) 20 MG tablet Take 1 tablet (20 mg total) by mouth daily.  Marland Kitchen glucose blood (ONETOUCH ULTRA) test strip Check BS daily dx E11.9  . levothyroxine (SYNTHROID) 50 MCG tablet Take 1 tablet (50 mcg total) by mouth daily.  . magnesium gluconate (MAGONATE) 500 MG tablet Take 500 mg by mouth daily.  . Misc Natural Products (JOINT HEALTH PO) Take by mouth.  . Multiple Vitamin (MULTIVITAMIN WITH MINERALS) TABS tablet Take 1 tablet by mouth daily.  . naproxen sodium (ALEVE) 220 MG tablet Take 220 mg by mouth.  . olmesartan (BENICAR) 40 MG tablet Take 1 tablet (40 mg total) by mouth daily.  . [DISCONTINUED] TURMERIC PO Take 2 tablets by mouth 2 (two) times daily.    No facility-administered encounter medications on file as of 12/24/2019.    Past Surgical History:  Procedure Laterality Date  . BREAST BIOPSY Right    negative  . CARPAL TUNNEL RELEASE     Right  . CLOSED REDUCTION MANDIBLE  02/20/2012  Procedure: CLOSED REDUCTION MANDIBULAR;  Surgeon: Isac Caddy, DDS;  Location: [facility];  Service: Oral Surgery;  Laterality: Right;  Closed reduction zygonach arch fx  . KNEE CARTILAGE SURGERY Left   . ORIF FACIAL FRACTURE  02/20/2012   Procedure: OPEN REDUCTION INTERNAL FIXATION (ORIF) MULTIPLE FACIAL FRACTURES;  Surgeon: Isac Caddy, DDS;  Location: [facility];  Service: Oral Surgery;  Laterality: Right;  ORIF Rt ZMC (zygomaxillary)  . TOTAL KNEE ARTHROPLASTY Left 10/12/2016   Procedure: TOTAL KNEE ARTHROPLASTY;  Surgeon: Carole Civil, MD;  Location: [facility];  Service: Orthopedics;  Laterality:  Left;    Family History  Problem Relation Age of Onset  . Arthritis Mother   . Heart disease Mother        enlarged heart  . Anemia Mother   . Stroke Mother   . Hearing loss Mother   . Arthritis Father   . Diabetes Father   . Hypertension Father   . Cancer Sister        breast  . Diabetes Brother   . CAD Brother   . Healthy Daughter   . Healthy Daughter     New complaints: None today  Social history: Lives by herself  Controlled substance contract: n/a    Review of Systems  Constitutional: Negative for diaphoresis.  Eyes: Negative for pain.  Respiratory: Negative for shortness of breath.   Cardiovascular: Negative for chest pain, palpitations and leg swelling.  Gastrointestinal: Negative for abdominal pain.  Endocrine: Negative for polydipsia.  Skin: Negative for rash.  Neurological: Negative for dizziness, weakness and headaches.  Hematological: Does not bruise/bleed easily.  All other systems reviewed and are negative.      Objective:   Physical Exam Vitals and nursing note reviewed.  Constitutional:      General: She is not in acute distress.    Appearance: Normal appearance. She is well-developed.  HENT:     Head: Normocephalic.     Nose: Nose normal.  Eyes:     Pupils: Pupils are equal, round, and reactive to light.  Neck:     Vascular: No carotid bruit or JVD.  Cardiovascular:     Rate and Rhythm: Normal rate and regular rhythm.     Heart sounds: Normal heart sounds.  Pulmonary:     Effort: Pulmonary effort is normal. No respiratory distress.     Breath sounds: Normal breath sounds. No wheezing or rales.  Chest:     Chest wall: No tenderness.  Abdominal:     General: Bowel sounds are normal. There is no distension or abdominal bruit.     Palpations: Abdomen is soft. There is no hepatomegaly, splenomegaly, mass or pulsatile mass.     Tenderness: There is no abdominal tenderness.  Musculoskeletal:        General: Normal range of motion.      Cervical back: Normal range of motion and neck supple.     Right lower leg: Edema (1+) present.     Left lower leg: Edema (1+) present.  Lymphadenopathy:     Cervical: No cervical adenopathy.  Skin:    General: Skin is warm and dry.  Neurological:     Mental Status: She is alert and oriented to person, place, and time.     Deep Tendon Reflexes: Reflexes are normal and symmetric.  Psychiatric:        Behavior: Behavior normal.        Thought Content: Thought content normal.  Judgment: Judgment normal.     BP (!) 146/82   Pulse 64   Temp (!) 97.4 F (36.3 C) (Temporal)   Resp 20   Ht $R'5\' 3"'Ca$  (1.6 m)   Wt 204 lb (92.5 kg)   BMI 36.14 kg/m        Assessment & Plan:  SHAHED YEOMAN comes in today with chief complaint of Medical Management of Chronic Issues   Diagnosis and orders addressed:  1. Primary  Low sodium diet - olmesartan (BENICAR) 40 MG tablet; Take 1 tablet (40 mg total) by mouth daily.  Dispense: 90 tablet; Refill: 1  - CBC with Differential/Platelet - CMP14+EGFR  2. Mixed hyperlipidemia Low fat diet - Lipid panel - atorvastatin (LIPITOR) 40 MG tablet; Take 1 tablet (40 mg total) by mouth daily.  Dispense: 90 tablet; Refill: 1  3. Controlled type 2 diabetes mellitus without complication, without long-term current use of insulin (HCC) Continue to watch carbs on diet - Bayer DCA Hb A1c Waived  4. Hypokalemia Report any lower ext d=cramping  5. Hypothyroidism (acquired) Labs pending - levothyroxine (SYNTHROID) 50 MCG tablet; Take 1 tablet (50 mcg total) by mouth daily.  Dispense: 90 tablet; Refill: 1  6. PAD (peripheral artery disease) ([place])  7. Peripheral edema Elevate when sitting - furosemide (LASIX) 20 MG tablet; Take 1 tablet (20 mg total) by mouth daily.  Dispense: 90 tablet; Refill: 1  8. Vitamin D deficiency continue daily vitamin d supplement  9. Osteopenia of lumbar spine Weight bearing exercises  10. BMI  32.0-32.9,adult Discussed diet and exercise for person with BMI >25 Will recheck weight in 3-6 months     Labs pending Health Maintenance reviewed Diet and exercise encouraged  Follow up plan: 3 months   Mary-Margaret Hassell Done, FNP

## 2019-12-24 NOTE — Patient Instructions (Signed)
Peripheral Edema  Peripheral edema is swelling that is caused by a buildup of fluid. Peripheral edema most often affects the lower legs, ankles, and feet. It can also develop in the arms, hands, and face. The area of the body that has peripheral edema will look swollen. It may also feel heavy or warm. Your clothes may start to feel tight. Pressing on the area may make a temporary dent in your skin. You may not be able to move your swollen arm or leg as much as usual. There are many causes of peripheral edema. It can happen because of a complication of other conditions such as congestive heart failure, kidney disease, or a problem with your blood circulation. It also can be a side effect of certain medicines or because of an infection. It often happens to women during pregnancy. Sometimes, the cause is not known. Follow these instructions at home: Managing pain, stiffness, and swelling   Raise (elevate) your legs while you are sitting or lying down.  Move around often to prevent stiffness and to lessen swelling.  Do not sit or stand for long periods of time.  Wear support stockings as told by your health care provider. Medicines  Take over-the-counter and prescription medicines only as told by your health care provider.  Your health care provider may prescribe medicine to help your body get rid of excess water (diuretic). General instructions  Pay attention to any changes in your symptoms.  Follow instructions from your health care provider about limiting salt (sodium) in your diet. Sometimes, eating less salt may reduce swelling.  Moisturize skin daily to help prevent skin from cracking and draining.  Keep all follow-up visits as told by your health care provider. This is important. Contact a health care provider if you have:  A fever.  Edema that starts suddenly or is getting worse, especially if you are pregnant or have a medical condition.  Swelling in only one leg.  Increased  swelling, redness, or pain in one or both of your legs.  Drainage or sores at the area where you have edema. Get help right away if you:  Develop shortness of breath, especially when you are lying down.  Have pain in your chest or abdomen.  Feel weak.  Feel faint. Summary  Peripheral edema is swelling that is caused by a buildup of fluid. Peripheral edema most often affects the lower legs, ankles, and feet.  Move around often to prevent stiffness and to lessen swelling. Do not sit or stand for long periods of time.  Pay attention to any changes in your symptoms.  Contact a health care provider if you have edema that starts suddenly or is getting worse, especially if you are pregnant or have a medical condition.  Get help right away if you develop shortness of breath, especially when lying down. This information is not intended to replace advice given to you by your health care provider. Make sure you discuss any questions you have with your health care provider. Document Revised: 11/08/2017 Document Reviewed: 11/08/2017 Elsevier Patient Education  2020 Elsevier Inc.  

## 2019-12-25 DIAGNOSIS — M8589 Other specified disorders of bone density and structure, multiple sites: Secondary | ICD-10-CM | POA: Diagnosis not present

## 2020-03-25 ENCOUNTER — Other Ambulatory Visit: Payer: Self-pay

## 2020-03-25 ENCOUNTER — Encounter: Payer: Self-pay | Admitting: Nurse Practitioner

## 2020-03-25 ENCOUNTER — Ambulatory Visit (INDEPENDENT_AMBULATORY_CARE_PROVIDER_SITE_OTHER): Payer: Medicare Other | Admitting: Nurse Practitioner

## 2020-03-25 VITALS — BP 128/82 | HR 65 | Temp 97.0°F | Resp 20 | Ht 63.0 in | Wt 204.0 lb

## 2020-03-25 DIAGNOSIS — I10 Essential (primary) hypertension: Secondary | ICD-10-CM | POA: Diagnosis not present

## 2020-03-25 DIAGNOSIS — R609 Edema, unspecified: Secondary | ICD-10-CM | POA: Diagnosis not present

## 2020-03-25 DIAGNOSIS — E039 Hypothyroidism, unspecified: Secondary | ICD-10-CM

## 2020-03-25 DIAGNOSIS — E782 Mixed hyperlipidemia: Secondary | ICD-10-CM | POA: Diagnosis not present

## 2020-03-25 DIAGNOSIS — E876 Hypokalemia: Secondary | ICD-10-CM

## 2020-03-25 DIAGNOSIS — E559 Vitamin D deficiency, unspecified: Secondary | ICD-10-CM | POA: Diagnosis not present

## 2020-03-25 DIAGNOSIS — E119 Type 2 diabetes mellitus without complications: Secondary | ICD-10-CM | POA: Diagnosis not present

## 2020-03-25 DIAGNOSIS — I739 Peripheral vascular disease, unspecified: Secondary | ICD-10-CM

## 2020-03-25 DIAGNOSIS — M8588 Other specified disorders of bone density and structure, other site: Secondary | ICD-10-CM

## 2020-03-25 DIAGNOSIS — Z6832 Body mass index (BMI) 32.0-32.9, adult: Secondary | ICD-10-CM

## 2020-03-25 LAB — BAYER DCA HB A1C WAIVED: HB A1C (BAYER DCA - WAIVED): 6.3 % (ref <7.0)

## 2020-03-25 MED ORDER — OLMESARTAN MEDOXOMIL 40 MG PO TABS
40.0000 mg | ORAL_TABLET | Freq: Every day | ORAL | 1 refills | Status: DC
Start: 2020-03-25 — End: 2020-08-27

## 2020-03-25 MED ORDER — ATORVASTATIN CALCIUM 40 MG PO TABS: 40.0000 mg | ORAL_TABLET | Freq: Every day | ORAL | 1 refills | Status: DC

## 2020-03-25 MED ORDER — FUROSEMIDE 20 MG PO TABS: 20.0000 mg | ORAL_TABLET | Freq: Every day | ORAL | 1 refills | Status: DC

## 2020-03-25 MED ORDER — LEVOTHYROXINE SODIUM 50 MCG PO TABS
50.0000 ug | ORAL_TABLET | Freq: Every day | ORAL | 1 refills | Status: DC
Start: 2020-03-25 — End: 2020-09-24

## 2020-03-25 NOTE — Patient Instructions (Signed)
Diabetes Mellitus and Foot Care Foot care is an important part of your health, especially when you have diabetes. Diabetes may cause you to have problems because of poor blood flow (circulation) to your feet and legs, which can cause your skin to:  Become thinner and drier.  Break more easily.  Heal more slowly.  Peel and crack. You may also have nerve damage (neuropathy) in your legs and feet, causing decreased feeling in them. This means that you may not notice minor injuries to your feet that could lead to more serious problems. Noticing and addressing any potential problems early is the best way to prevent future foot problems. How to care for your feet Foot hygiene  Wash your feet daily with warm water and mild soap. Do not use hot water. Then, pat your feet and the areas between your toes until they are completely dry. Do not soak your feet as this can dry your skin.  Trim your toenails straight across. Do not dig under them or around the cuticle. File the edges of your nails with an emery board or nail file.  Apply a moisturizing lotion or petroleum jelly to the skin on your feet and to dry, brittle toenails. Use lotion that does not contain alcohol and is unscented. Do not apply lotion between your toes.   Shoes and socks  Wear clean socks or stockings every day. Make sure they are not too tight. Do not wear knee-high stockings since they may decrease blood flow to your legs.  Wear shoes that fit properly and have enough cushioning. Always look in your shoes before you put them on to be sure there are no objects inside.  To break in new shoes, wear them for just a few hours a day. This prevents injuries on your feet. Wounds, scrapes, corns, and calluses  Check your feet daily for blisters, cuts, bruises, sores, and redness. If you cannot see the bottom of your feet, use a mirror or ask someone for help.  Do not cut corns or calluses or try to remove them with medicine.  If you  find a minor scrape, cut, or break in the skin on your feet, keep it and the skin around it clean and dry. You may clean these areas with mild soap and water. Do not clean the area with peroxide, alcohol, or iodine.  If you have a wound, scrape, corn, or callus on your foot, look at it several times a day to make sure it is healing and not infected. Check for: ? Redness, swelling, or pain. ? Fluid or blood. ? Warmth. ? Pus or a bad smell.   General tips  Do not cross your legs. This may decrease blood flow to your feet.  Do not use heating pads or hot water bottles on your feet. They may burn your skin. If you have lost feeling in your feet or legs, you may not know this is happening until it is too late.  Protect your feet from hot and cold by wearing shoes, such as at the beach or on hot pavement.  Schedule a complete foot exam at least once a year (annually) or more often if you have foot problems. Report any cuts, sores, or bruises to your health care provider immediately. Where to find more information  American Diabetes Association: www.diabetes.org  Association of Diabetes Care & Education Specialists: www.diabeteseducator.org Contact a health care provider if:  You have a medical condition that increases your risk of infection and   you have any cuts, sores, or bruises on your feet.  You have an injury that is not healing.  You have redness on your legs or feet.  You feel burning or tingling in your legs or feet.  You have pain or cramps in your legs and feet.  Your legs or feet are numb.  Your feet always feel cold.  You have pain around any toenails. Get help right away if:  You have a wound, scrape, corn, or callus on your foot and: ? You have pain, swelling, or redness that gets worse. ? You have fluid or blood coming from the wound, scrape, corn, or callus. ? Your wound, scrape, corn, or callus feels warm to the touch. ? You have pus or a bad smell coming from  the wound, scrape, corn, or callus. ? You have a fever. ? You have a red line going up your leg. Summary  Check your feet every day for blisters, cuts, bruises, sores, and redness.  Apply a moisturizing lotion or petroleum jelly to the skin on your feet and to dry, brittle toenails.  Wear shoes that fit properly and have enough cushioning.  If you have foot problems, report any cuts, sores, or bruises to your health care provider immediately.  Schedule a complete foot exam at least once a year (annually) or more often if you have foot problems. This information is not intended to replace advice given to you by your health care provider. Make sure you discuss any questions you have with your health care provider. Document Revised: 09/05/2019 Document Reviewed: 09/05/2019 Elsevier Patient Education  2021 Elsevier Inc.  

## 2020-03-25 NOTE — Progress Notes (Signed)
Subjective:    Patient ID: Mckenzie Leonard, female    DOB: 06-24-1950, 70 y.o.   MRN: [id number]   Chief Complaint: Medical Management of Chronic Issues    HPI:  1. Mixed hyperlipidemia Does not really watch diet and does very little exercise. Lab Results  Component Value Date   CHOL 114 12/24/2019   HDL 43 12/24/2019   LDLCALC 51 12/24/2019   TRIG 108 12/24/2019   CHOLHDL 2.7 12/24/2019     2. Controlled type 2 diabetes mellitus without complication, without long-term current use of insulin (HCC) Fasting blood sugars range from 110-140. She denies any low blood sugars.  Lab Results  Component Value Date   HGBA1C 6.1 12/24/2019     3. Vitamin D deficiency Takes a daily vitamin d supplement  4. Primary hypertension No c/o chest pain, sob or headache. Does  check her blood pressure at home, always in the 294'T systolic BP Readings from Last 3 Encounters:  03/25/20 (!) 152/76  12/24/19 (!) 146/82  11/06/19 (!) 147/80      5. Hypothyroidism (acquired) No problems that aware of. Lab Results  Component Value Date   TSH 2.310 09/03/2019     6. Hypokalemia Has occasional lower ext cramping Lab Results  Component Value Date   K 4.6 12/24/2019     7. PAD (peripheral artery disease) (HCC) No edema  8. Osteopenia of lumbar spine Last dexascan was done 12/25/19. Her t score was -1.6. does no weight bearing exercises.  9. Peripheral edema Has occasionally  10. BMI 32.0-32.9,adult No recent weight changes Wt Readings from Last 3 Encounters:  03/25/20 204 lb (92.5 kg)  12/24/19 204 lb (92.5 kg)  11/06/19 203 lb (92.1 kg)   BMI Readings from Last 3 Encounters:  03/25/20 36.14 kg/m  12/24/19 36.14 kg/m  11/06/19 35.96 kg/m       Outpatient Encounter Medications as of 03/25/2020  Medication Sig  . Ascorbic Acid (VITAMIN C) 100 MG tablet Take 100 mg by mouth daily.  Marland Kitchen aspirin EC 81 MG tablet Take 81 mg by mouth daily. Swallow whole.  Marland Kitchen  atorvastatin (LIPITOR) 40 MG tablet Take 1 tablet (40 mg total) by mouth daily.  . Blood Glucose Monitoring Suppl (BLOOD GLUCOSE METER KIT AND SUPPLIES) KIT Dispense based on patient and insurance preference. Use to check BG once daily.  ICD 10 code = E11.9  . calcium-vitamin D (OSCAL WITH D) 500-200 MG-UNIT tablet Take 1 tablet by mouth 2 (two) times daily.  . Cholecalciferol (VITAMIN D) 2000 units CAPS Take 6,000 Units by mouth daily.   . furosemide (LASIX) 20 MG tablet Take 1 tablet (20 mg total) by mouth daily.  Marland Kitchen glucose blood (ONETOUCH ULTRA) test strip Check BS daily dx E11.9  . levothyroxine (SYNTHROID) 50 MCG tablet Take 1 tablet (50 mcg total) by mouth daily.  . magnesium gluconate (MAGONATE) 500 MG tablet Take 500 mg by mouth daily.  . Misc Natural Products (JOINT HEALTH PO) Take by mouth.  . Multiple Vitamin (MULTIVITAMIN WITH MINERALS) TABS tablet Take 1 tablet by mouth daily.  . naproxen sodium (ALEVE) 220 MG tablet Take 220 mg by mouth.  . olmesartan (BENICAR) 40 MG tablet Take 1 tablet (40 mg total) by mouth daily.     Past Surgical History:  Procedure Laterality Date  . BREAST BIOPSY Right    negative  . CARPAL TUNNEL RELEASE     Right  . CLOSED REDUCTION MANDIBLE  02/20/2012   Procedure: CLOSED REDUCTION  MANDIBULAR;  Surgeon: Isac Caddy, DDS;  Location: [facility];  Service: Oral Surgery;  Laterality: Right;  Closed reduction zygonach arch fx  . KNEE CARTILAGE SURGERY Left   . ORIF FACIAL FRACTURE  02/20/2012   Procedure: OPEN REDUCTION INTERNAL FIXATION (ORIF) MULTIPLE FACIAL FRACTURES;  Surgeon: Isac Caddy, DDS;  Location: [facility];  Service: Oral Surgery;  Laterality: Right;  ORIF Rt ZMC (zygomaxillary)  . TOTAL KNEE ARTHROPLASTY Left 10/12/2016   Procedure: TOTAL KNEE ARTHROPLASTY;  Surgeon: Carole Civil, MD;  Location: [facility];  Service: Orthopedics;  Laterality: Left;    Family History  Problem Relation Age of Onset  . Arthritis Mother   .  Heart disease Mother        enlarged heart  . Anemia Mother   . Stroke Mother   . Hearing loss Mother   . Arthritis Father   . Diabetes Father   . Hypertension Father   . Cancer Sister        breast  . Diabetes Brother   . CAD Brother   . Healthy Daughter   . Healthy Daughter     New complaints: None today  Social history: Lives by herself. Family checks on her daily  Controlled substance contract: n/a    Review of Systems  Constitutional: Negative for diaphoresis.  Eyes: Negative for pain.  Respiratory: Negative for shortness of breath.   Cardiovascular: Negative for chest pain, palpitations and leg swelling.  Gastrointestinal: Negative for abdominal pain.  Endocrine: Negative for polydipsia.  Skin: Negative for rash.  Neurological: Negative for dizziness, weakness and headaches.  Hematological: Does not bruise/bleed easily.  All other systems reviewed and are negative.      Objective:   Physical Exam Vitals and nursing note reviewed.  Constitutional:      General: She is not in acute distress.    Appearance: Normal appearance. She is well-developed and well-nourished.  HENT:     Head: Normocephalic.     Nose: Nose normal.     Mouth/Throat:     Mouth: Oropharynx is clear and moist.  Eyes:     Extraocular Movements: EOM normal.     Pupils: Pupils are equal, round, and reactive to light.  Neck:     Vascular: No carotid bruit or JVD.  Cardiovascular:     Rate and Rhythm: Normal rate and regular rhythm.     Pulses: Intact distal pulses.     Heart sounds: Normal heart sounds.  Pulmonary:     Effort: Pulmonary effort is normal. No respiratory distress.     Breath sounds: Normal breath sounds. No wheezing or rales.  Chest:     Chest wall: No tenderness.  Abdominal:     General: Bowel sounds are normal. There is no distension or abdominal bruit. Aorta is normal.     Palpations: Abdomen is soft. There is no hepatomegaly, splenomegaly, mass or pulsatile mass.      Tenderness: There is no abdominal tenderness.  Musculoskeletal:        General: Normal range of motion.     Cervical back: Normal range of motion and neck supple.     Right lower leg: Edema (1+) present.     Left lower leg: Edema (2+) present.  Lymphadenopathy:     Cervical: No cervical adenopathy.  Skin:    General: Skin is warm and dry.  Neurological:     Mental Status: She is alert and oriented to person, place, and time.  Deep Tendon Reflexes: Reflexes are normal and symmetric.  Psychiatric:        Mood and Affect: Mood and affect normal.        Behavior: Behavior normal.        Thought Content: Thought content normal.        Judgment: Judgment normal.     BP 128/82   Pulse 65   Temp (!) 97 F (36.1 C) (Temporal)   Resp 20   Ht $R'5\' 3"'QP$  (1.6 m)   Wt 204 lb (92.5 kg)   SpO2 98%   BMI 36.14 kg/m   HGBA1c 6.3%       Assessment & Plan:  Mckenzie Leonard comes in today with chief complaint of Medical Management of Chronic Issues   Diagnosis and orders addressed:  1. Mixed hyperlipidemia Low fat diet - Lipid panel - atorvastatin (LIPITOR) 40 MG tablet; Take 1 tablet (40 mg total) by mouth daily.  Dispense: 90 tablet; Refill: 1  2. Controlled type 2 diabetes mellitus without complication, without long-term current use of insulin (HCC) Continue to watch carbs in diet - Bayer DCA Hb A1c Waived - CBC with Differential/Platelet - CMP14+EGFR  3. Vitamin D deficiency Continue daily vitamin d - VITAMIN D 25 Hydroxy (Vit-D Deficiency, Fractures)  4. Primary hypertension Low sodium diet - olmesartan (BENICAR) 40 MG tablet; Take 1 tablet (40 mg total) by mouth daily.  Dispense: 90 tablet; Refill: 1  5. Hypothyroidism (acquired) Labs pending - levothyroxine (SYNTHROID) 50 MCG tablet; Take 1 tablet (50 mcg total) by mouth daily.  Dispense: 90 tablet; Refill: 1  6. Hypokalemia Labs pending  7. PAD (peripheral artery disease) ([place]) Daily baby aspirin  8.  Osteopenia of lumbar spine Weight bearing exercises enocuraged  9. Peripheral edema Elevate legs when sitting - furosemide (LASIX) 20 MG tablet; Take 1 tablet (20 mg total) by mouth daily.  Dispense: 90 tablet; Refill: 1  10. BMI 32.0-32.9,adult Discussed diet and exercise for person with BMI >25 Will recheck weight in 3-6 months    Labs pending Health Maintenance reviewed Diet and exercise encouraged  Follow up plan: 6 months   Mary-Margaret Hassell Done, FNP

## 2020-03-26 LAB — CBC WITH DIFFERENTIAL/PLATELET
Basophils Absolute: 0.1 10*3/uL (ref 0.0–0.2)
Basos: 1 %
EOS (ABSOLUTE): 0.2 10*3/uL (ref 0.0–0.4)
Eos: 3 %
Hematocrit: 39.7 % (ref 34.0–46.6)
Hemoglobin: 13.3 g/dL (ref 11.1–15.9)
Immature Grans (Abs): 0 10*3/uL (ref 0.0–0.1)
Immature Granulocytes: 0 %
Lymphocytes Absolute: 1.7 10*3/uL (ref 0.7–3.1)
Lymphs: 25 %
MCH: 29.6 pg (ref 26.6–33.0)
MCHC: 33.5 g/dL (ref 31.5–35.7)
MCV: 88 fL (ref 79–97)
Monocytes Absolute: 0.4 10*3/uL (ref 0.1–0.9)
Monocytes: 7 %
Neutrophils Absolute: 4.2 10*3/uL (ref 1.4–7.0)
Neutrophils: 64 %
Platelets: 242 10*3/uL (ref 150–450)
RBC: 4.49 x10E6/uL (ref 3.77–5.28)
RDW: 14.1 % (ref 11.7–15.4)
WBC: 6.5 10*3/uL (ref 3.4–10.8)

## 2020-03-26 LAB — CMP14+EGFR
ALT: 23 IU/L (ref 0–32)
AST: 22 IU/L (ref 0–40)
Albumin/Globulin Ratio: 2 (ref 1.2–2.2)
Albumin: 4.5 g/dL (ref 3.7–4.7)
Alkaline Phosphatase: 85 IU/L (ref 44–121)
BUN/Creatinine Ratio: 21 (ref 12–28)
BUN: 17 mg/dL (ref 8–27)
Bilirubin Total: 1 mg/dL (ref 0.0–1.2)
CO2: 25 mmol/L (ref 20–29)
Calcium: 9.4 mg/dL (ref 8.7–10.3)
Chloride: 101 mmol/L (ref 96–106)
Creatinine, Ser: 0.8 mg/dL (ref 0.57–1.00)
GFR calc Af Amer: 86 mL/min/{1.73_m2} (ref >59)
GFR calc non Af Amer: 74 mL/min/{1.73_m2} (ref >59)
Globulin, Total: 2.3 g/dL (ref 1.5–4.5)
Glucose: 109 mg/dL — ABNORMAL HIGH (ref 65–99)
Potassium: 4.8 mmol/L (ref 3.5–5.2)
Sodium: 143 mmol/L (ref 134–144)
Total Protein: 6.8 g/dL (ref 6.0–8.5)

## 2020-03-26 LAB — LIPID PANEL
Chol/HDL Ratio: 3.1 ratio (ref 0.0–4.4)
Cholesterol, Total: 122 mg/dL (ref 100–199)
HDL: 40 mg/dL (ref >39)
LDL Chol Calc (NIH): 54 mg/dL (ref 0–99)
Triglycerides: 166 mg/dL — ABNORMAL HIGH (ref 0–149)
VLDL Cholesterol Cal: 28 mg/dL (ref 5–40)

## 2020-03-26 LAB — VITAMIN D 25 HYDROXY (VIT D DEFICIENCY, FRACTURES): Vit D, 25-Hydroxy: 54.9 ng/mL (ref 30.0–100.0)

## 2020-05-05 LAB — HM DIABETES EYE EXAM

## 2020-07-17 ENCOUNTER — Other Ambulatory Visit (HOSPITAL_COMMUNITY): Payer: Self-pay | Admitting: Nurse Practitioner

## 2020-07-17 DIAGNOSIS — Z1231 Encounter for screening mammogram for malignant neoplasm of breast: Secondary | ICD-10-CM

## 2020-08-27 ENCOUNTER — Other Ambulatory Visit: Payer: Self-pay | Admitting: Nurse Practitioner

## 2020-08-27 DIAGNOSIS — I10 Essential (primary) hypertension: Secondary | ICD-10-CM

## 2020-09-03 ENCOUNTER — Ambulatory Visit (HOSPITAL_COMMUNITY)
Admission: RE | Admit: 2020-09-03 | Discharge: 2020-09-03 | Disposition: A | Discharge status: Home / Self Care | Payer: Medicare Other | Source: Ambulatory Visit | Attending: Nurse Practitioner | Admitting: Nurse Practitioner

## 2020-09-03 DIAGNOSIS — Z1231 Encounter for screening mammogram for malignant neoplasm of breast: Secondary | ICD-10-CM | POA: Diagnosis not present

## 2020-09-24 ENCOUNTER — Encounter: Payer: Self-pay | Admitting: Nurse Practitioner

## 2020-09-24 ENCOUNTER — Ambulatory Visit (INDEPENDENT_AMBULATORY_CARE_PROVIDER_SITE_OTHER): Payer: Medicare Other | Admitting: Nurse Practitioner

## 2020-09-24 ENCOUNTER — Other Ambulatory Visit: Payer: Self-pay

## 2020-09-24 VITALS — BP 128/80 | HR 64 | Temp 97.4°F | Resp 20 | Ht 63.0 in | Wt 202.0 lb

## 2020-09-24 DIAGNOSIS — E782 Mixed hyperlipidemia: Secondary | ICD-10-CM

## 2020-09-24 DIAGNOSIS — R600 Localized edema: Secondary | ICD-10-CM

## 2020-09-24 DIAGNOSIS — E039 Hypothyroidism, unspecified: Secondary | ICD-10-CM | POA: Diagnosis not present

## 2020-09-24 DIAGNOSIS — M8588 Other specified disorders of bone density and structure, other site: Secondary | ICD-10-CM | POA: Diagnosis not present

## 2020-09-24 DIAGNOSIS — E876 Hypokalemia: Secondary | ICD-10-CM | POA: Diagnosis not present

## 2020-09-24 DIAGNOSIS — I739 Peripheral vascular disease, unspecified: Secondary | ICD-10-CM | POA: Diagnosis not present

## 2020-09-24 DIAGNOSIS — R609 Edema, unspecified: Secondary | ICD-10-CM

## 2020-09-24 DIAGNOSIS — E119 Type 2 diabetes mellitus without complications: Secondary | ICD-10-CM | POA: Diagnosis not present

## 2020-09-24 DIAGNOSIS — I10 Essential (primary) hypertension: Secondary | ICD-10-CM

## 2020-09-24 DIAGNOSIS — E559 Vitamin D deficiency, unspecified: Secondary | ICD-10-CM | POA: Diagnosis not present

## 2020-09-24 LAB — CMP14+EGFR
ALT: 20 IU/L (ref 0–32)
AST: 19 IU/L (ref 0–40)
Albumin/Globulin Ratio: 2 (ref 1.2–2.2)
Albumin: 4.5 g/dL (ref 3.7–4.7)
Alkaline Phosphatase: 83 IU/L (ref 44–121)
BUN/Creatinine Ratio: 14 (ref 12–28)
BUN: 10 mg/dL (ref 8–27)
Bilirubin Total: 1.2 mg/dL (ref 0.0–1.2)
CO2: 27 mmol/L (ref 20–29)
Calcium: 9 mg/dL (ref 8.7–10.3)
Chloride: 100 mmol/L (ref 96–106)
Creatinine, Ser: 0.72 mg/dL (ref 0.57–1.00)
Globulin, Total: 2.3 g/dL (ref 1.5–4.5)
Glucose: 105 mg/dL — ABNORMAL HIGH (ref 65–99)
Potassium: 4.6 mmol/L (ref 3.5–5.2)
Sodium: 142 mmol/L (ref 134–144)
Total Protein: 6.8 g/dL (ref 6.0–8.5)
eGFR: 89 mL/min/{1.73_m2} (ref >59)

## 2020-09-24 LAB — LIPID PANEL
Chol/HDL Ratio: 3 ratio (ref 0.0–4.4)
Cholesterol, Total: 122 mg/dL (ref 100–199)
HDL: 41 mg/dL (ref >39)
LDL Chol Calc (NIH): 55 mg/dL (ref 0–99)
Triglycerides: 154 mg/dL — ABNORMAL HIGH (ref 0–149)
VLDL Cholesterol Cal: 26 mg/dL (ref 5–40)

## 2020-09-24 LAB — CBC WITH DIFFERENTIAL/PLATELET
Basophils Absolute: 0.1 10*3/uL (ref 0.0–0.2)
Basos: 1 %
EOS (ABSOLUTE): 0.3 10*3/uL (ref 0.0–0.4)
Eos: 5 %
Hematocrit: 41.4 % (ref 34.0–46.6)
Hemoglobin: 13.1 g/dL (ref 11.1–15.9)
Immature Grans (Abs): 0 10*3/uL (ref 0.0–0.1)
Immature Granulocytes: 0 %
Lymphocytes Absolute: 1.5 10*3/uL (ref 0.7–3.1)
Lymphs: 23 %
MCH: 28.1 pg (ref 26.6–33.0)
MCHC: 31.6 g/dL (ref 31.5–35.7)
MCV: 89 fL (ref 79–97)
Monocytes Absolute: 0.5 10*3/uL (ref 0.1–0.9)
Monocytes: 7 %
Neutrophils Absolute: 4.3 10*3/uL (ref 1.4–7.0)
Neutrophils: 64 %
Platelets: 238 10*3/uL (ref 150–450)
RBC: 4.67 x10E6/uL (ref 3.77–5.28)
RDW: 14.5 % (ref 11.7–15.4)
WBC: 6.7 10*3/uL (ref 3.4–10.8)

## 2020-09-24 LAB — BAYER DCA HB A1C WAIVED: HB A1C (BAYER DCA - WAIVED): 6.2 % (ref <7.0)

## 2020-09-24 MED ORDER — ATORVASTATIN CALCIUM 40 MG PO TABS: 40.0000 mg | ORAL_TABLET | Freq: Every day | ORAL | 1 refills | Status: DC

## 2020-09-24 MED ORDER — LEVOTHYROXINE SODIUM 50 MCG PO TABS: 50.0000 ug | ORAL_TABLET | Freq: Every day | ORAL | 1 refills | Status: DC

## 2020-09-24 MED ORDER — OLMESARTAN MEDOXOMIL 40 MG PO TABS: 40.0000 mg | ORAL_TABLET | Freq: Every day | ORAL | 1 refills | Status: DC

## 2020-09-24 NOTE — Progress Notes (Signed)
Subjective:    Patient ID: Mckenzie Leonard, female    DOB: 1950/12/29, 70 y.o.   MRN: [id number]   Chief Complaint: Medical Management of Chronic Issues    HPI:  1. Primary hypertension No c/o chest pain, sob or headache. Does n check blood pressure at home and usually runs 948'A systolic. BP Readings from Last 3 Encounters:  09/24/20 (!) 154/81  03/25/20 128/82  12/24/19 (!) 146/82     2. Mixed hyperlipidemia Does not watch diet and does no dedicated exercise. Lab Results  Component Value Date   CHOL 122 03/25/2020   HDL 40 03/25/2020   LDLCALC 54 03/25/2020   TRIG 166 (H) 03/25/2020   CHOLHDL 3.1 03/25/2020     3. Controlled type 2 diabetes mellitus without complication, without long-term current use of insulin (HCC) Fasting blood sugars usually run from 110-130. No low blood sugars Lab Results  Component Value Date   HGBA1C 6.3 03/25/2020     4. PAD (peripheral artery disease) (HCC) Denies any lower ext cramping or swelling  5. Hypothyroidism (acquired) No problems that patient is aware of Lab Results  Component Value Date   TSH 2.310 09/03/2019  '  6. Hypokalemia No lower ext cramping. Lab Results  Component Value Date   K 4.8 03/25/2020     7. Peripheral edema Has some edema daily. She stopped her lasi because eit made her feel weak.  8. Vitamin D deficiency Is on daily vitamin d supplement  9. Osteopenia of lumbar spine Last dexascan was done 12/24/19. T score was -1.4  10. BMI 36.0-36.9 Weight is down 2lbs from previous Wt Readings from Last 3 Encounters:  09/24/20 202 lb (91.6 kg)  03/25/20 204 lb (92.5 kg)  12/24/19 204 lb (92.5 kg)   BMI Readings from Last 3 Encounters:  09/24/20 35.78 kg/m  03/25/20 36.14 kg/m  12/24/19 36.14 kg/m     Outpatient Encounter Medications as of 09/24/2020  Medication Sig   Ascorbic Acid (VITAMIN C) 100 MG tablet Take 100 mg by mouth daily.   aspirin EC 81 MG tablet Take 81 mg by mouth daily.  Swallow whole.   atorvastatin (LIPITOR) 40 MG tablet Take 1 tablet (40 mg total) by mouth daily.   Blood Glucose Monitoring Suppl (BLOOD GLUCOSE METER KIT AND SUPPLIES) KIT Dispense based on patient and insurance preference. Use to check BG once daily.  ICD 10 code = E11.9   calcium-vitamin D (OSCAL WITH D) 500-200 MG-UNIT tablet Take 1 tablet by mouth 2 (two) times daily.   Cholecalciferol (VITAMIN D) 2000 units CAPS Take 6,000 Units by mouth daily.    glucose blood (ONETOUCH ULTRA) test strip Check BS daily dx E11.9   levothyroxine (SYNTHROID) 50 MCG tablet Take 1 tablet (50 mcg total) by mouth daily.   magnesium gluconate (MAGONATE) 500 MG tablet Take 500 mg by mouth daily.   Misc Natural Products (JOINT HEALTH PO) Take by mouth.   Multiple Vitamin (MULTIVITAMIN WITH MINERALS) TABS tablet Take 1 tablet by mouth daily.   Multiple Vitamins-Minerals (ZINC PO) Take by mouth.   naproxen sodium (ALEVE) 220 MG tablet Take 220 mg by mouth.   olmesartan (BENICAR) 40 MG tablet TAKE 1 TABLET DAILY   [DISCONTINUED] furosemide (LASIX) 20 MG tablet Take 1 tablet (20 mg total) by mouth daily.   No facility-administered encounter medications on file as of 09/24/2020.    Past Surgical History:  Procedure Laterality Date   BREAST BIOPSY Right    negative  CARPAL TUNNEL RELEASE     Right   CLOSED REDUCTION MANDIBLE  02/20/2012   Procedure: CLOSED REDUCTION MANDIBULAR;  Surgeon: Isac Caddy, DDS;  Location: [facility];  Service: Oral Surgery;  Laterality: Right;  Closed reduction zygonach arch fx   KNEE CARTILAGE SURGERY Left    ORIF FACIAL FRACTURE  02/20/2012   Procedure: OPEN REDUCTION INTERNAL FIXATION (ORIF) MULTIPLE FACIAL FRACTURES;  Surgeon: Isac Caddy, DDS;  Location: [facility];  Service: Oral Surgery;  Laterality: Right;  ORIF Rt [facility] (zygomaxillary)   TOTAL KNEE ARTHROPLASTY Left 10/12/2016   Procedure: TOTAL KNEE ARTHROPLASTY;  Surgeon: Carole Civil, MD;  Location: [facility];   Service: Orthopedics;  Laterality: Left;    Family History  Problem Relation Age of Onset   Arthritis Mother    Heart disease Mother        enlarged heart   Anemia Mother    Stroke Mother    Hearing loss Mother    Arthritis Father    Diabetes Father    Hypertension Father    Cancer Sister        breast   Diabetes Brother    CAD Brother    Healthy Daughter    Healthy Daughter     New complaints: None today  Social history: Lives by herself  Controlled substance contract: n/a     Review of Systems  Constitutional:  Negative for diaphoresis.  Eyes:  Negative for pain.  Respiratory:  Negative for shortness of breath.   Cardiovascular:  Negative for chest pain, palpitations and leg swelling.  Gastrointestinal:  Negative for abdominal pain.  Endocrine: Negative for polydipsia.  Skin:  Negative for rash.  Neurological:  Negative for dizziness, weakness and headaches.  Hematological:  Does not bruise/bleed easily.  All other systems reviewed and are negative.     Objective:   Physical Exam Vitals and nursing note reviewed.  Constitutional:      General: She is not in acute distress.    Appearance: Normal appearance. She is well-developed.  HENT:     Head: Normocephalic.     Right Ear: Tympanic membrane normal.     Left Ear: Tympanic membrane normal.     Nose: Nose normal.     Mouth/Throat:     Mouth: Mucous membranes are moist.  Eyes:     Pupils: Pupils are equal, round, and reactive to light.  Neck:     Vascular: No carotid bruit or JVD.  Cardiovascular:     Rate and Rhythm: Normal rate and regular rhythm.     Heart sounds: Normal heart sounds.  Pulmonary:     Effort: Pulmonary effort is normal. No respiratory distress.     Breath sounds: Normal breath sounds. No wheezing or rales.  Chest:     Chest wall: No tenderness.  Abdominal:     General: Bowel sounds are normal. There is no distension or abdominal bruit.     Palpations: Abdomen is soft. There  is no hepatomegaly, splenomegaly, mass or pulsatile mass.     Tenderness: There is no abdominal tenderness.  Musculoskeletal:        General: Normal range of motion.     Cervical back: Normal range of motion and neck supple.     Right lower leg: Edema (1+) present.     Left lower leg: Edema (2+) present.  Lymphadenopathy:     Cervical: No cervical adenopathy.  Skin:    General: Skin is warm and dry.  Neurological:  Mental Status: She is alert and oriented to person, place, and time.     Deep Tendon Reflexes: Reflexes are normal and symmetric.  Psychiatric:        Behavior: Behavior normal.        Thought Content: Thought content normal.        Judgment: Judgment normal.   BP 128/80   Pulse 64   Temp (!) 97.4 F (36.3 C) (Temporal)   Resp 20   Ht 5' 3" (1.6 m)   Wt 202 lb (91.6 kg)   SpO2 100%   BMI 35.78 kg/m   HGBA1c 6.2%     Assessment & Plan:  Mckenzie Leonard comes in today with chief complaint of Medical Management of Chronic Issues   Diagnosis and orders addressed:  1. Primary hypertension Low sodium diet - CBC with Differential/Platelet - CMP14+EGFR - olmesartan (BENICAR) 40 MG tablet; Take 1 tablet (40 mg total) by mouth daily.  Dispense: 90 tablet; Refill: 1  2. Mixed hyperlipidemia Low fat diet - Lipid panel - atorvastatin (LIPITOR) 40 MG tablet; Take 1 tablet (40 mg total) by mouth daily.  Dispense: 90 tablet; Refill: 1  3. Controlled type 2 diabetes mellitus without complication, without long-term current use of insulin (HCC) Keep diary of blood sugars - Bayer DCA Hb A1c Waived - Microalbumin / creatinine urine ratio  4. PAD (peripheral artery disease) (HCC) Compression socks  5. Hypothyroidism (acquired) Labs pending - levothyroxine (SYNTHROID) 50 MCG tablet; Take 1 tablet (50 mcg total) by mouth daily.  Dispense: 90 tablet; Refill: 1  6. Hypokalemia Labs pending  7. Peripheral edema Elevate when sitting Try to take lasix at least every  other day  8. Vitamin D deficiency Daily vitamin d supplement  9. Osteopenia of lumbar spine Weight bearing exercises  10. Morbid obesity ([place]) Discussed diet and exercise for person with BMI >25 Will recheck weight in 3-6 months    Labs pending Health Maintenance reviewed Diet and exercise encouraged  Follow up plan: 6 months   Mary-Margaret Hassell Done, FNP

## 2020-09-24 NOTE — Patient Instructions (Signed)
Peripheral Edema Peripheral edema is swelling that is caused by a buildup of fluid. Peripheral edema most often affects the lower legs, ankles, and feet. It can also develop in the arms, hands, and face. The area of the body that has peripheral edema will look swollen. It may also feel heavy or warm. Your clothes may start to feel tight. Pressing on the area may make a temporary dent in your skin. You may not be able to move your swollen arm or leg as much as usual. There are many causes of peripheral edema. It can happen because of a complication of other conditions such as congestive heart failure, kidney disease, or a problem with your blood circulation. It also can be a side effect of certain medicines or because of an infection. It often happens to women during pregnancy. Sometimes, the cause is not known. Follow these instructions at home: Managing pain, stiffness, and swelling  Raise (elevate) your legs while you are sitting or lying down. Move around often to prevent stiffness and to lessen swelling. Do not sit or stand for long periods of time. Wear support stockings as told by your health care provider. Medicines Take over-the-counter and prescription medicines only as told by your health care provider. Your health care provider may prescribe medicine to help your body get rid of excess water (diuretic). General instructions Pay attention to any changes in your symptoms. Follow instructions from your health care provider about limiting salt (sodium) in your diet. Sometimes, eating less salt may reduce swelling. Moisturize skin daily to help prevent skin from cracking and draining. Keep all follow-up visits as told by your health care provider. This is important. Contact a health care provider if you have: A fever. Edema that starts suddenly or is getting worse, especially if you are pregnant or have a medical condition. Swelling in only one leg. Increased swelling, redness, or pain in  one or both of your legs. Drainage or sores at the area where you have edema. Get help right away if you: Develop shortness of breath, especially when you are lying down. Have pain in your chest or abdomen. Feel weak. Feel faint. Summary Peripheral edema is swelling that is caused by a buildup of fluid. Peripheral edema most often affects the lower legs, ankles, and feet. Move around often to prevent stiffness and to lessen swelling. Do not sit or stand for long periods of time. Pay attention to any changes in your symptoms. Contact a health care provider if you have edema that starts suddenly or is getting worse, especially if you are pregnant or have a medical condition. Get help right away if you develop shortness of breath, especially when lying down. This information is not intended to replace advice given to you by your health care provider. Make sure you discuss any questions you have with your health care provider. Document Revised: 11/08/2017 Document Reviewed: 11/08/2017 [employer]vier Patient Education  2022 [employer]  

## 2020-10-02 ENCOUNTER — Ambulatory Visit (INDEPENDENT_AMBULATORY_CARE_PROVIDER_SITE_OTHER): Payer: Medicare Other

## 2020-10-02 VITALS — Ht 63.0 in | Wt 201.0 lb

## 2020-10-02 DIAGNOSIS — Z0000 Encounter for general adult medical examination without abnormal findings: Secondary | ICD-10-CM

## 2020-10-02 NOTE — Progress Notes (Signed)
Subjective:   Mckenzie Leonard is a 70 y.o. female who presents for Medicare Annual (Subsequent) preventive examination.  Virtual Visit via Telephone Note  I connected with  Mckenzie Leonard on 10/02/20 at  9:45 AM EDT by telephone and verified that I am speaking with the correct person using two identifiers.  Location: Patient: Home Provider: [facility] Persons participating in the virtual visit: patient/Nurse Health Advisor   I discussed the limitations, risks, security and privacy concerns of performing an evaluation and management service by telephone and the availability of in person appointments. The patient expressed understanding and agreed to proceed.  Interactive audio and video telecommunications were attempted between this nurse and patient, however failed, due to patient having technical difficulties OR patient did not have access to video capability.  We continued and completed visit with audio only.  Some vital signs may be absent or patient reported.   Haden Cavenaugh E Kye Hedden, LPN   Review of Systems     Cardiac Risk Factors include: advanced age (>73mn, >>77women);diabetes mellitus;dyslipidemia;hypertension;obesity (BMI >30kg/m2);sedentary lifestyle     Objective:    Today's Vitals   10/02/20 0954  Weight: 201 lb (91.2 kg)  Height: _0  (1.6 m)   Body mass index is 35.61 kg/m.  Advanced Directives 10/02/2020 10/02/2019 09/03/2018 08/30/2017 11/01/2016 10/12/2016 10/12/2016  Does Patient Have a Medical Advance Directive? _1  No No  Would patient like information on creating a medical advance directive? No - Patient declined No - Patient declined No - Patient declined No - Patient declined - No - Patient declined No - Patient declined  Pre-existing out of facility DNR order (yellow form or pink MOST form) - - - - - - -    Current Medications (verified) Outpatient Encounter Medications as of 10/02/2020  Medication Sig   Ascorbic Acid (VITAMIN C) 100 MG tablet Take 100 mg by  mouth daily.   aspirin EC 81 MG tablet Take 81 mg by mouth daily. Swallow whole.   atorvastatin (LIPITOR) 40 MG tablet Take 1 tablet (40 mg total) by mouth daily.   Blood Glucose Monitoring Suppl (BLOOD GLUCOSE METER KIT AND SUPPLIES) KIT Dispense based on patient and insurance preference. Use to check BG once daily.  ICD 10 code = E11.9   calcium-vitamin D (OSCAL WITH D) 500-200 MG-UNIT tablet Take 1 tablet by mouth 2 (two) times daily.   Cholecalciferol (VITAMIN D) 2000 units CAPS Take 6,000 Units by mouth daily.    glucose blood (ONETOUCH ULTRA) test strip Check BS daily dx E11.9   levothyroxine (SYNTHROID) 50 MCG tablet Take 1 tablet (50 mcg total) by mouth daily.   magnesium gluconate (MAGONATE) 500 MG tablet Take 500 mg by mouth daily.   Misc Natural Products (JOINT HEALTH PO) Take by mouth.   Multiple Vitamin (MULTIVITAMIN WITH MINERALS) TABS tablet Take 1 tablet by mouth daily.   Multiple Vitamins-Minerals (ZINC PO) Take by mouth.   naproxen sodium (ALEVE) 220 MG tablet Take 220 mg by mouth.   olmesartan (BENICAR) 40 MG tablet Take 1 tablet (40 mg total) by mouth daily.   No facility-administered encounter medications on file as of 10/02/2020.    Allergies (verified) Sulfa antibiotics, Statins, and Tetanus toxoids   History: Past Medical History:  Diagnosis Date   Arthritis    Diabetes mellitus without complication (HCC)    High cholesterol    Hypertension    Hypothyroidism    PONV (postoperative nausea and vomiting)    Thyroid  disease    Past Surgical History:  Procedure Laterality Date   BREAST BIOPSY Right    negative   CARPAL TUNNEL RELEASE     Right   CLOSED REDUCTION MANDIBLE  02/20/2012   Procedure: CLOSED REDUCTION MANDIBULAR;  Surgeon: Isac Caddy, DDS;  Location: [place];  Service: Oral Surgery;  Laterality: Right;  Closed reduction zygonach arch fx   KNEE CARTILAGE SURGERY Left    ORIF FACIAL FRACTURE  02/20/2012   Procedure: OPEN REDUCTION INTERNAL  FIXATION (ORIF) MULTIPLE FACIAL FRACTURES;  Surgeon: Isac Caddy, DDS;  Location: [place];  Service: Oral Surgery;  Laterality: Right;  ORIF Rt ZMC (zygomaxillary)   TOTAL KNEE ARTHROPLASTY Left 10/12/2016   Procedure: TOTAL KNEE ARTHROPLASTY;  Surgeon: Carole Civil, MD;  Location: [facility];  Service: Orthopedics;  Laterality: Left;   Family History  Problem Relation Age of Onset   Arthritis Mother    Heart disease Mother        enlarged heart   Anemia Mother    Stroke Mother    Hearing loss Mother    Arthritis Father    Diabetes Father    Hypertension Father    Cancer Sister        breast   Diabetes Brother    CAD Brother    Healthy Daughter    Healthy Daughter    Social History   Socioeconomic History   Marital status: Divorced    Spouse name: Not on file   Number of children: 2   Years of education: 10   Highest education level: GED or equivalent  Occupational History   Occupation: retired    Comment: Charity fundraiser  Tobacco Use   Smoking status: Never   Smokeless tobacco: Never  Scientific laboratory technician Use: Never used  Substance and Sexual Activity   Alcohol use: No   Drug use: No   Sexual activity: Yes    Birth control/protection: Post-menopausal  Other Topics Concern   Not on file  Social History Narrative   Not on file   Social Determinants of Health   Financial Resource Strain: Low Risk    Difficulty of Paying Living Expenses: Not hard at all  Food Insecurity: No Food Insecurity   Worried About Charity fundraiser in the Last Year: Never true   [place] in the Last Year: Never true  Transportation Needs: No Transportation Needs   Lack of Transportation (Medical): No   Lack of Transportation (Non-Medical): No  Physical Activity: Insufficiently Active   Days of Exercise per Week: 7 days   Minutes of Exercise per Session: 10 min  Stress: No Stress Concern Present   Feeling of Stress : Not at all  Social Connections: Moderately Integrated    Frequency of Communication with Friends and Family: More than three times a week   Frequency of Social Gatherings with Friends and Family: More than three times a week   Attends Religious Services: More than 4 times per year   Active Member of [employer] or Organizations: Yes   Attends Music therapist: More than 4 times per year   Marital Status: Divorced    Tobacco Counseling Counseling given: Not Answered   Clinical Intake:  Pre-visit preparation completed: Yes  Pain : No/denies pain     BMI - recorded: 35.61 Nutritional Status: BMI > 30  Obese Nutritional Risks: None Diabetes: No  How often do you need to have someone help you when you  read instructions, pamphlets, or other written materials from your doctor or pharmacy?: 1 - Never  Diabetic? Nutrition Risk Assessment:  Has the patient had any N/V/D within the last 2 months?  No  Does the patient have any non-healing wounds?  No  Has the patient had any unintentional weight loss or weight gain?  No   Diabetes:  Is the patient diabetic?  Yes  If diabetic, was a CBG obtained today?  No  Did the patient bring in their glucometer from home?  No  How often do you monitor your CBG's? Once daily fasting - 109 this am per patient.   Financial Strains and Diabetes Management:  Are you having any financial strains with the device, your supplies or your medication? No .  Does the patient want to be seen by Chronic Care Management for management of their diabetes?  No  Would the patient like to be referred to a Nutritionist or for Diabetic Management?  No   Diabetic Exams:  Diabetic Eye Exam: Completed 04/2020.   Diabetic Foot Exam: Completed 03/25/2020. Pt has been advised about the importance in completing this exam. Pt is scheduled for diabetic foot exam on 03/29/2021.    Interpreter Needed?: No  Information entered by :: Ashanty Coltrane, LPN   Activities of Daily Living In your present state of health, do you  have any difficulty performing the following activities: 10/02/2020  Hearing? N  Vision? N  Difficulty concentrating or making decisions? N  Walking or climbing stairs? N  Dressing or bathing? N  Doing errands, shopping? N  Preparing Food and eating ? N  Using the Toilet? N  In the past six months, have you accidently leaked urine? Y  Comment if she waits too long only  Do you have problems with loss of bowel control? N  Managing your Medications? N  Managing your Finances? N  Housekeeping or managing your Housekeeping? N  Some recent data might be hidden    Patient Care Team: [name], FNP as PCP - General (Nurse Practitioner) Melina Schools, OD (Optometry) Carole Civil, MD as Consulting Physician (Orthopedic Surgery)  Indicate any recent Medical Services you may have received from other than [facility] providers in the past year (date may be approximate).     Assessment:   This is a routine wellness examination for [place].  Hearing/Vision screen Hearing Screening - Comments:: Denies hearing difficulties  Vision Screening - Comments:: Wears eyeglasses - up to date with annual eye exams at [place] issues and exercise activities discussed: Current Exercise Habits: Home exercise routine, Type of exercise: walking;stretching, Time (Minutes): 15, Frequency (Times/Week): 7, Weekly Exercise (Minutes/Week): 105, Intensity: Mild, Exercise limited by: orthopedic condition(s)   Goals Addressed             This Visit's Progress    DIET - INCREASE WATER INTAKE   On track    Try to drink 6-8 glasses of water daily.     Exercise 150 minutes per week (moderate activity)   On track    Walk for 30 minutes daily          Depression Screen PHQ 2/9 Scores 10/02/2020 09/24/2020 03/25/2020 12/24/2019 10/02/2019 09/03/2019 09/03/2018  PHQ - 2 Score 0 0 0 0 0 0 0  PHQ- 9 Score 0 0 - - - - -    Fall Risk Fall Risk  10/02/2020 09/24/2020 03/25/2020 12/24/2019 10/02/2019   Falls in the past year? 0 0 0 0 0  Injury with Fall? 0 - - - -  Risk for fall due to : Orthopedic patient - - - -  Follow up Falls prevention discussed - - - -    [place]:  Any stairs in or around the home? Yes  If so, are there any without handrails? No  Home free of loose throw rugs in walkways, pet beds, electrical cords, etc? Yes  Adequate lighting in your home to reduce risk of falls? Yes   ASSISTIVE DEVICES UTILIZED TO PREVENT FALLS:  Life alert? No  Use of a cane, walker or w/c? No  Grab bars in the bathroom? No  Shower chair or bench in shower? No  Elevated toilet seat or a handicapped toilet? No   TIMED UP AND GO:  Was the test performed? No . Telephonic visit  Cognitive Function: Normal cognitive status assessed by direct observation by this Nurse Health Advisor. No abnormalities found.    MMSE - Mini Mental State Exam 08/30/2017 08/29/2016  Orientation to time 5 5  Orientation to Place 5 5  Registration 3 3  Attention/ Calculation 5 5  Recall 3 3  Language- name 2 objects 2 2  Language- repeat 1 1  Language- follow 3 step command 3 3  Language- read & follow direction 1 1  Write a sentence 1 1  Copy design 1 1  Total score 30 30     6CIT Screen 10/02/2019 09/03/2018  What Year? 0 points 0 points  What month? 0 points 0 points  What time? 0 points 0 points  Count back from 20 0 points 0 points  Months in reverse 0 points 0 points  Repeat phrase 0 points 0 points  Total Score 0 0    Immunizations Immunization History  Administered Date(s) Administered   Tdap 02/18/2012    TDAP status: Up to date  Flu Vaccine status: Declined, Education has been provided regarding the importance of this vaccine but patient still declined. Advised may receive this vaccine at local pharmacy or Health Dept. Aware to provide a copy of the vaccination record if obtained from local pharmacy or Health Dept. Verbalized acceptance and  understanding.  Pneumococcal vaccine status: Declined,  Education has been provided regarding the importance of this vaccine but patient still declined. Advised may receive this vaccine at local pharmacy or Health Dept. Aware to provide a copy of the vaccination record if obtained from local pharmacy or Health Dept. Verbalized acceptance and understanding.   Covid-19 vaccine status: Declined, Education has been provided regarding the importance of this vaccine but patient still declined. Advised may receive this vaccine at local pharmacy or Health Dept.or vaccine clinic. Aware to provide a copy of the vaccination record if obtained from local pharmacy or Health Dept. Verbalized acceptance and understanding.  Qualifies for Shingles Vaccine? Yes   Zostavax completed No   Shingrix Completed?: No.    Education has been provided regarding the importance of this vaccine. Patient has been advised to call insurance company to determine out of pocket expense if they have not yet received this vaccine. Advised may also receive vaccine at local pharmacy or Health Dept. Verbalized acceptance and understanding.  Screening Tests Health Maintenance  Topic Date Due   OPHTHALMOLOGY EXAM  08/24/2019   INFLUENZA VACCINE  09/28/2020   COVID-19 Vaccine (1) 10/10/2020 (Originally 11/09/1955)   Zoster Vaccines- Shingrix (1 of 2) 12/25/2020 (Originally 11/10/1998)   [place] FOBT  03/27/2021 (Originally 08/04/2020)  PNA vac Low Risk Adult (1 of 2 - PCV13) 09/24/2021 (Originally 11/09/2013)   FOOT EXAM  03/25/2021   HEMOGLOBIN A1C  03/27/2021   DEXA SCAN  12/24/2021   TETANUS/TDAP  02/17/2022   MAMMOGRAM  09/04/2022   Hepatitis C Screening  Completed   HPV VACCINES  Aged Out   COLONOSCOPY (Pts 45-51yr Insurance coverage will need to be confirmed)  Discontinued    Health Maintenance  Health Maintenance Due  Topic Date Due   OPHTHALMOLOGY EXAM  08/24/2019   INFLUENZA VACCINE  09/28/2020     Colorectal cancer screening: No longer required.  PATIENT DECLINES  Mammogram status: Completed 09/03/2020. Repeat every year  Bone Density status: Completed 12/25/2019. Results reflect: Bone density results: OSTEOPENIA. Repeat every 2 years.  Lung Cancer Screening: (Low Dose CT Chest recommended if Age 72-80years, 30 pack-year currently smoking OR have quit w/in 15years.) does not qualify.   Additional Screening:  Hepatitis C Screening: does qualify; Completed 05/22/2015  Vision Screening: Recommended annual ophthalmology exams for early detection of glaucoma and other disorders of the eye. Is the patient up to date with their annual eye exam?  Yes  Who is the provider or what is the name of the office in which the patient attends annual eye exams? [facility] is not established with a provider, would they like to be referred to a provider to establish care? No .   Dental Screening: Recommended annual dental exams for proper oral hygiene  Community Resource Referral / Chronic Care Management: CRR required this visit?  No   CCM required this visit?  No      Plan:     I have personally reviewed and noted the following in the patient's chart:   Medical and social history Use of alcohol, tobacco or illicit drugs  Current medications and supplements including opioid prescriptions.  Functional ability and status Nutritional status Physical activity Advanced directives List of other physicians Hospitalizations, surgeries, and ER visits in previous 12 months Vitals Screenings to include cognitive, depression, and falls Referrals and appointments  In addition, I have reviewed and discussed with patient certain preventive protocols, quality metrics, and best practice recommendations. A written personalized care plan for preventive services as well as general preventive health recommendations were provided to patient.     ASandrea Hammond LPN   [id number]  Nurse  Notes: None

## 2020-10-02 NOTE — Patient Instructions (Signed)
[name] , Thank you for taking time to come for your Medicare Wellness Visit. I appreciate your ongoing commitment to your health goals. Please review the following plan we discussed and let me know if I can assist you in the future.   Screening recommendations/referrals: Colonoscopy: Last done 2009 - Declines repeat Mammogram: Done 09/03/2020 - Repeat annually Bone Density: Done 12/25/2019 - Repeat every 2 years Recommended yearly ophthalmology/optometry visit for glaucoma screening and checkup Recommended yearly dental visit for hygiene and checkup  Vaccinations: Influenza vaccine: Declined Pneumococcal vaccine: Declined Tdap vaccine: Done 02/18/2012 - Repeat in 10 years Shingles vaccine: Declined   Covid-19:Declined  Advanced directives: Please bring a copy of your health care power of attorney and living will to the office to be added to your chart at your convenience.   Conditions/risks identified: Aim for 30 minutes of exercise or brisk walking each day, drink 6-8 glasses of water and eat lots of fruits and vegetables.   Next appointment: Follow up in one year for your annual wellness visit    Preventive Care 65 Years and Older, Female Preventive care refers to lifestyle choices and visits with your health care provider that can promote health and wellness. What does preventive care include? A yearly physical exam. This is also called an annual well check. Dental exams once or twice a year. Routine eye exams. Ask your health care provider how often you should have your eyes checked. Personal lifestyle choices, including: Daily care of your teeth and gums. Regular physical activity. Eating a healthy diet. Avoiding tobacco and drug use. Limiting alcohol use. Practicing safe sex. Taking low-dose aspirin every day. Taking vitamin and mineral supplements as recommended by your health care provider. What happens during an annual well check? The services and screenings done by  your health care provider during your annual well check will depend on your age, overall health, lifestyle risk factors, and family history of disease. Counseling  Your health care provider may ask you questions about your: Alcohol use. Tobacco use. Drug use. Emotional well-being. Home and relationship well-being. Sexual activity. Eating habits. History of falls. Memory and ability to understand (cognition). Work and work Statistician. Reproductive health. Screening  You may have the following tests or measurements: Height, weight, and BMI. Blood pressure. Lipid and cholesterol levels. These may be checked every 5 years, or more frequently if you are over 51 years old. Skin check. Lung cancer screening. You may have this screening every year starting at age 59 if you have a 30-pack-year history of smoking and currently smoke or have quit within the past 15 years. Fecal occult blood test (FOBT) of the stool. You may have this test every year starting at age 15. Flexible sigmoidoscopy or colonoscopy. You may have a sigmoidoscopy every 5 years or a colonoscopy every 10 years starting at age 19. Hepatitis C blood test. Hepatitis B blood test. Sexually transmitted disease (STD) testing. Diabetes screening. This is done by checking your blood sugar (glucose) after you have not eaten for a while (fasting). You may have this done every 1-3 years. Bone density scan. This is done to screen for osteoporosis. You may have this done starting at age 72. Mammogram. This may be done every 1-2 years. Talk to your health care provider about how often you should have regular mammograms. Talk with your health care provider about your test results, treatment options, and if necessary, the need for more tests. Vaccines  Your health care provider may recommend certain vaccines,  such as: Influenza vaccine. This is recommended every year. Tetanus, diphtheria, and acellular pertussis (Tdap, Td) vaccine. You  may need a Td booster every 10 years. Zoster vaccine. You may need this after age 4. Pneumococcal 13-valent conjugate (PCV13) vaccine. One dose is recommended after age 26. Pneumococcal polysaccharide (PPSV23) vaccine. One dose is recommended after age 54. Talk to your health care provider about which screenings and vaccines you need and how often you need them. This information is not intended to replace advice given to you by your health care provider. Make sure you discuss any questions you have with your health care provider. Document Released: 03/13/2015 Document Revised: 11/04/2015 Document Reviewed: 12/16/2014 Elsevier Interactive Patient Education  2017 [employer] Prevention in the Home Falls can cause injuries. They can happen to people of all ages. There are many things you can do to make your home safe and to help prevent falls. What can I do on the outside of my home? Regularly fix the edges of walkways and driveways and fix any cracks. Remove anything that might make you trip as you walk through a door, such as a raised step or threshold. Trim any bushes or trees on the path to your home. Use bright outdoor lighting. Clear any walking paths of anything that might make someone trip, such as rocks or tools. Regularly check to see if handrails are loose or broken. Make sure that both sides of any steps have handrails. Any raised decks and porches should have guardrails on the edges. Have any leaves, snow, or ice cleared regularly. Use sand or salt on walking paths during winter. Clean up any spills in your garage right away. This includes oil or grease spills. What can I do in the bathroom? Use night lights. Install grab bars by the toilet and in the tub and shower. Do not use towel bars as grab bars. Use non-skid mats or decals in the tub or shower. If you need to sit down in the shower, use a plastic, non-slip stool. Keep the floor dry. Clean up any water that spills  on the floor as soon as it happens. Remove soap buildup in the tub or shower regularly. Attach bath mats securely with double-sided non-slip rug tape. Do not have throw rugs and other things on the floor that can make you trip. What can I do in the bedroom? Use night lights. Make sure that you have a light by your bed that is easy to reach. Do not use any sheets or blankets that are too big for your bed. They should not hang down onto the floor. Have a firm chair that has side arms. You can use this for support while you get dressed. Do not have throw rugs and other things on the floor that can make you trip. What can I do in the kitchen? Clean up any spills right away. Avoid walking on wet floors. Keep items that you use a lot in easy-to-reach places. If you need to reach something above you, use a strong step stool that has a grab bar. Keep electrical cords out of the way. Do not use floor polish or wax that makes floors slippery. If you must use wax, use non-skid floor wax. Do not have throw rugs and other things on the floor that can make you trip. What can I do with my stairs? Do not leave any items on the stairs. Make sure that there are handrails on both sides of the stairs and  use them. Fix handrails that are broken or loose. Make sure that handrails are as long as the stairways. Check any carpeting to make sure that it is firmly attached to the stairs. Fix any carpet that is loose or worn. Avoid having throw rugs at the top or bottom of the stairs. If you do have throw rugs, attach them to the floor with carpet tape. Make sure that you have a light switch at the top of the stairs and the bottom of the stairs. If you do not have them, ask someone to add them for you. What else can I do to help prevent falls? Wear shoes that: Do not have high heels. Have rubber bottoms. Are comfortable and fit you well. Are closed at the toe. Do not wear sandals. If you use a stepladder: Make  sure that it is fully opened. Do not climb a closed stepladder. Make sure that both sides of the stepladder are locked into place. Ask someone to hold it for you, if possible. Clearly mark and make sure that you can see: Any grab bars or handrails. First and last steps. Where the edge of each step is. Use tools that help you move around (mobility aids) if they are needed. These include: Canes. Walkers. Scooters. Crutches. Turn on the lights when you go into a dark area. Replace any light bulbs as soon as they burn out. Set up your furniture so you have a clear path. Avoid moving your furniture around. If any of your floors are uneven, fix them. If there are any pets around you, be aware of where they are. Review your medicines with your doctor. Some medicines can make you feel dizzy. This can increase your chance of falling. Ask your doctor what other things that you can do to help prevent falls. This information is not intended to replace advice given to you by your health care provider. Make sure you discuss any questions you have with your health care provider. Document Released: 12/11/2008 Document Revised: 07/23/2015 Document Reviewed: 03/21/2014 Elsevier Interactive Patient Education  2017 [employer].

## 2020-10-12 ENCOUNTER — Other Ambulatory Visit: Payer: Self-pay | Admitting: Nurse Practitioner

## 2020-11-04 DIAGNOSIS — I10 Essential (primary) hypertension: Secondary | ICD-10-CM | POA: Diagnosis not present

## 2020-11-04 DIAGNOSIS — R9431 Abnormal electrocardiogram [ECG] [EKG]: Secondary | ICD-10-CM | POA: Diagnosis not present

## 2020-11-04 DIAGNOSIS — Z7982 Long term (current) use of aspirin: Secondary | ICD-10-CM | POA: Diagnosis not present

## 2020-11-04 DIAGNOSIS — I491 Atrial premature depolarization: Secondary | ICD-10-CM | POA: Diagnosis not present

## 2020-11-04 DIAGNOSIS — E119 Type 2 diabetes mellitus without complications: Secondary | ICD-10-CM | POA: Diagnosis not present

## 2020-11-04 DIAGNOSIS — R0789 Other chest pain: Secondary | ICD-10-CM | POA: Diagnosis not present

## 2020-11-04 DIAGNOSIS — R42 Dizziness and giddiness: Secondary | ICD-10-CM | POA: Diagnosis not present

## 2020-11-04 DIAGNOSIS — Z79899 Other long term (current) drug therapy: Secondary | ICD-10-CM | POA: Diagnosis not present

## 2020-11-04 DIAGNOSIS — R079 Chest pain, unspecified: Secondary | ICD-10-CM | POA: Diagnosis not present

## 2020-11-04 DIAGNOSIS — R61 Generalized hyperhidrosis: Secondary | ICD-10-CM | POA: Diagnosis not present

## 2020-11-04 DIAGNOSIS — Z96659 Presence of unspecified artificial knee joint: Secondary | ICD-10-CM | POA: Diagnosis not present

## 2020-11-04 DIAGNOSIS — R52 Pain, unspecified: Secondary | ICD-10-CM | POA: Diagnosis not present

## 2020-11-04 DIAGNOSIS — R072 Precordial pain: Secondary | ICD-10-CM | POA: Diagnosis not present

## 2020-11-04 DIAGNOSIS — E079 Disorder of thyroid, unspecified: Secondary | ICD-10-CM | POA: Diagnosis not present

## 2020-11-04 DIAGNOSIS — I499 Cardiac arrhythmia, unspecified: Secondary | ICD-10-CM | POA: Diagnosis not present

## 2021-01-07 ENCOUNTER — Other Ambulatory Visit: Payer: Self-pay | Admitting: Nurse Practitioner

## 2021-03-29 ENCOUNTER — Ambulatory Visit (INDEPENDENT_AMBULATORY_CARE_PROVIDER_SITE_OTHER): Payer: Medicare Other | Admitting: Nurse Practitioner

## 2021-03-29 ENCOUNTER — Encounter: Payer: Self-pay | Admitting: Nurse Practitioner

## 2021-03-29 VITALS — BP 152/77 | HR 69 | Temp 96.8°F | Resp 20 | Ht 63.0 in | Wt 201.0 lb

## 2021-03-29 DIAGNOSIS — M8588 Other specified disorders of bone density and structure, other site: Secondary | ICD-10-CM | POA: Diagnosis not present

## 2021-03-29 DIAGNOSIS — E039 Hypothyroidism, unspecified: Secondary | ICD-10-CM | POA: Diagnosis not present

## 2021-03-29 DIAGNOSIS — E119 Type 2 diabetes mellitus without complications: Secondary | ICD-10-CM

## 2021-03-29 DIAGNOSIS — L989 Disorder of the skin and subcutaneous tissue, unspecified: Secondary | ICD-10-CM | POA: Diagnosis not present

## 2021-03-29 DIAGNOSIS — E782 Mixed hyperlipidemia: Secondary | ICD-10-CM

## 2021-03-29 DIAGNOSIS — E876 Hypokalemia: Secondary | ICD-10-CM

## 2021-03-29 DIAGNOSIS — E559 Vitamin D deficiency, unspecified: Secondary | ICD-10-CM | POA: Diagnosis not present

## 2021-03-29 DIAGNOSIS — I739 Peripheral vascular disease, unspecified: Secondary | ICD-10-CM | POA: Diagnosis not present

## 2021-03-29 DIAGNOSIS — I10 Essential (primary) hypertension: Secondary | ICD-10-CM

## 2021-03-29 DIAGNOSIS — R609 Edema, unspecified: Secondary | ICD-10-CM

## 2021-03-29 DIAGNOSIS — Z6832 Body mass index (BMI) 32.0-32.9, adult: Secondary | ICD-10-CM

## 2021-03-29 LAB — BAYER DCA HB A1C WAIVED: HB A1C (BAYER DCA - WAIVED): 6 % — ABNORMAL HIGH (ref 4.8–5.6)

## 2021-03-29 MED ORDER — LEVOTHYROXINE SODIUM 50 MCG PO TABS: 50.0000 ug | ORAL_TABLET | Freq: Every day | ORAL | 1 refills | Status: DC

## 2021-03-29 MED ORDER — OLMESARTAN MEDOXOMIL 40 MG PO TABS: 40.0000 mg | ORAL_TABLET | Freq: Every day | ORAL | 1 refills | Status: DC

## 2021-03-29 MED ORDER — ATORVASTATIN CALCIUM 40 MG PO TABS: 40.0000 mg | ORAL_TABLET | Freq: Every day | ORAL | 1 refills | Status: DC

## 2021-03-29 NOTE — Patient Instructions (Signed)

## 2021-03-29 NOTE — Progress Notes (Addendum)
Subjective:    Patient ID: Mckenzie Leonard, female    DOB: Nov 28, 1950, 71 y.o.   MRN: [id number]   Chief Complaint: medical management of chronic issues     HPI:  Mckenzie Leonard is a 71 y.o. who identifies as a female who was assigned female at birth.   Social history: Lives with: by herself- her daughters check  on her dialy Work history: retired   Water engineer in today for follow up of the following chronic medical issues:  1. Primary hypertension No c/o chest pain, sob or headache. Does not check blood pressure at home. BP Readings from Last 3 Encounters:  09/24/20 128/80  03/25/20 128/82  12/24/19 (!) 146/82     2. Mixed hyperlipidemia Tires to watch diet and stays very active. Lab Results  Component Value Date   CHOL 122 09/24/2020   HDL 41 09/24/2020   LDLCALC 55 09/24/2020   TRIG 154 (H) 09/24/2020   CHOLHDL 3.0 09/24/2020  The ASCVD Risk score (Arnett DK, et al., 2019) failed to calculate for the following reasons:   The valid total cholesterol range is 130 to 320 mg/dL     3. Controlled type 2 diabetes mellitus without complication, without long-term current use of insulin (HCC) Her fasting blood sugars range form 120-150. Denies any low blood sugars. Lab Results  Component Value Date   HGBA1C 6.2 09/24/2020      4. Hypothyroidism (acquired) No problem that she is aware of. Lab Results  Component Value Date   TSH 2.310 09/03/2019     5. PAD (peripheral artery disease) (HCC) Has some edema by the end of each day.  6. Hypokalemia No c/o lower ext cramping. Lab Results  Component Value Date   K 4.6 09/24/2020     7. Peripheral edema Again has some edema at end of each day.  8. Vitamin D deficiency On daily vitamin d supplement  9. Osteopenia of lumbar spine Last dexascan was Leonard on 12/25/19. T score was -1.4.  10. BMI 32.0-32.9,adult No recent weight changes Wt Readings from Last 3 Encounters:  03/29/21 201 lb (91.2 kg)  10/02/20 201  lb (91.2 kg)  09/24/20 202 lb (91.6 kg)   BMI Readings from Last 3 Encounters:  03/29/21 35.61 kg/m  10/02/20 35.61 kg/m  09/24/20 35.78 kg/m      New complaints: None today  Allergies  Allergen Reactions   Sulfa Antibiotics Anaphylaxis   Statins Other (See Comments)    One of them caused muscle pain   Tetanus Toxoids     Reports flu like symptoms and swelling at injection site only   Outpatient Encounter Medications as of 03/29/2021  Medication Sig   Ascorbic Acid (VITAMIN C) 100 MG tablet Take 100 mg by mouth daily.   aspirin EC 81 MG tablet Take 81 mg by mouth daily. Swallow whole.   atorvastatin (LIPITOR) 40 MG tablet Take 1 tablet (40 mg total) by mouth daily.   Blood Glucose Monitoring Suppl (BLOOD GLUCOSE METER KIT AND SUPPLIES) KIT Dispense based on patient and insurance preference. Use to check BG once daily.  ICD 10 code = E11.9   calcium-vitamin D (OSCAL WITH D) 500-200 MG-UNIT tablet Take 1 tablet by mouth 2 (two) times daily.   Cholecalciferol (VITAMIN D) 2000 units CAPS Take 6,000 Units by mouth daily.    glucose blood (ONETOUCH ULTRA) test strip CHECK BLOOD SUGAR DAILY AS DIRECTED Dx E11.9   levothyroxine (SYNTHROID) 50 MCG tablet Take 1 tablet (50  mcg total) by mouth daily.   magnesium gluconate (MAGONATE) 500 MG tablet Take 500 mg by mouth daily.   Misc Natural Products (JOINT HEALTH PO) Take by mouth.   Multiple Vitamin (MULTIVITAMIN WITH MINERALS) TABS tablet Take 1 tablet by mouth daily.   Multiple Vitamins-Minerals (ZINC PO) Take by mouth.   naproxen sodium (ALEVE) 220 MG tablet Take 220 mg by mouth.   olmesartan (BENICAR) 40 MG tablet Take 1 tablet (40 mg total) by mouth daily.   No facility-administered encounter medications on file as of 03/29/2021.    Past Surgical History:  Procedure Laterality Date   BREAST BIOPSY Right    negative   CARPAL TUNNEL RELEASE     Right   CLOSED REDUCTION MANDIBLE  02/20/2012   Procedure: CLOSED REDUCTION  MANDIBULAR;  Surgeon: Isac Caddy, DDS;  Location: [place];  Service: Oral Surgery;  Laterality: Right;  Closed reduction zygonach arch fx   KNEE CARTILAGE SURGERY Left    ORIF FACIAL FRACTURE  02/20/2012   Procedure: OPEN REDUCTION INTERNAL FIXATION (ORIF) MULTIPLE FACIAL FRACTURES;  Surgeon: Isac Caddy, DDS;  Location: [place];  Service: Oral Surgery;  Laterality: Right;  ORIF Rt ZMC (zygomaxillary)   TOTAL KNEE ARTHROPLASTY Left 10/12/2016   Procedure: TOTAL KNEE ARTHROPLASTY;  Surgeon: Carole Civil, MD;  Location: [facility];  Service: Orthopedics;  Laterality: Left;    Family History  Problem Relation Age of Onset   Arthritis Mother    Heart disease Mother        enlarged heart   Anemia Mother    Stroke Mother    Hearing loss Mother    Arthritis Father    Diabetes Father    Hypertension Father    Cancer Sister        breast   Diabetes Brother    CAD Brother    Healthy Daughter    Healthy Daughter       Controlled substance contract: n/a     Review of Systems  Constitutional:  Negative for diaphoresis.  Eyes:  Negative for pain.  Respiratory:  Negative for shortness of breath.   Cardiovascular:  Negative for chest pain, palpitations and leg swelling.  Gastrointestinal:  Negative for abdominal pain.  Endocrine: Negative for polydipsia.  Skin:  Negative for rash.  Neurological:  Negative for dizziness, weakness and headaches.  Hematological:  Does not bruise/bleed easily.  All other systems reviewed and are negative.     Objective:   Physical Exam Vitals and nursing note reviewed.  Constitutional:      General: She is not in acute distress.    Appearance: Normal appearance. She is well-developed.  HENT:     Head: Normocephalic.     Right Ear: Tympanic membrane normal.     Left Ear: Tympanic membrane normal.     Nose: Nose normal.     Mouth/Throat:     Mouth: Mucous membranes are moist.  Eyes:     Pupils: Pupils are equal, round, and  reactive to light.  Neck:     Vascular: No carotid bruit or JVD.  Cardiovascular:     Rate and Rhythm: Normal rate and regular rhythm.     Heart sounds: Normal heart sounds.  Pulmonary:     Effort: Pulmonary effort is normal. No respiratory distress.     Breath sounds: Normal breath sounds. No wheezing or rales.  Chest:     Chest wall: No tenderness.  Abdominal:     General: Bowel sounds  are normal. There is no distension or abdominal bruit.     Palpations: Abdomen is soft. There is no hepatomegaly, splenomegaly, mass or pulsatile mass.     Tenderness: There is no abdominal tenderness.  Musculoskeletal:        General: Normal range of motion.     Cervical back: Normal range of motion and neck supple.  Lymphadenopathy:     Cervical: No cervical adenopathy.  Skin:    General: Skin is warm and dry.     Comments: See attached picture- skin lesion on left scapula  Neurological:     Mental Status: She is alert and oriented to person, place, and time.     Deep Tendon Reflexes: Reflexes are normal and symmetric.  Psychiatric:        Behavior: Behavior normal.        Thought Content: Thought content normal.        Judgment: Judgment normal.      BP (!) 152/77    Pulse 69    Temp (!) 96.8 F (36 C) (Temporal)    Resp 20    Ht $R'5\' 3"'cs$  (1.6 m)    Wt 201 lb (91.2 kg)    SpO2 99%    BMI 35.61 kg/m         Assessment & Plan:  ANAIAH MCMANNIS comes in today with chief complaint of Medical Management of Chronic Issues   Diagnosis and orders addressed:  1. Primary hypertension Low sodium diet - CBC with Differential/Platelet - CMP14+EGFR - olmesartan (BENICAR) 40 MG tablet; Take 1 tablet (40 mg total) by mouth daily.  Dispense: 90 tablet; Refill: 1  2. Mixed hyperlipidemia Low fat diet - Lipid panel - atorvastatin (LIPITOR) 40 MG tablet; Take 1 tablet (40 mg total) by mouth daily.  Dispense: 90 tablet; Refill: 1  3. Controlled type 2 diabetes mellitus without complication,  without long-term current use of insulin (HCC) Continue to watch carbs in diet - Bayer DCA Hb A1c Waived  4. Hypothyroidism (acquired) Labs pending - levothyroxine (SYNTHROID) 50 MCG tablet; Take 1 tablet (50 mcg total) by mouth daily.  Dispense: 90 tablet; Refill: 1  5. PAD (peripheral artery disease) ([place])  6. Hypokalemia Labs pending  7. Peripheral edema Wear compression socks when up walking around alot   8. Vitamin D deficiency Continue daily vitamin d supplement  9. Osteopenia of lumbar spine Weight bearing exercise  10. BMI 32.0-32.9,adult Discussed diet and exercise for person with BMI >25 Will recheck weight in 3-6 months    Labs pending Health Maintenance reviewed Diet and exercise encouraged  Follow up plan: 6 months   Mckenzie Hassell Done, FNP

## 2021-03-29 NOTE — Addendum Note (Signed)
Addended by: [name] on: 03/29/2021 11:39 AM   Modules accepted: Orders

## 2021-03-30 LAB — CMP14+EGFR
ALT: 21 IU/L (ref 0–32)
AST: 18 IU/L (ref 0–40)
Albumin/Globulin Ratio: 2.1 (ref 1.2–2.2)
Albumin: 4.8 g/dL — ABNORMAL HIGH (ref 3.7–4.7)
Alkaline Phosphatase: 86 IU/L (ref 44–121)
BUN/Creatinine Ratio: 18 (ref 12–28)
BUN: 14 mg/dL (ref 8–27)
Bilirubin Total: 1.3 mg/dL — ABNORMAL HIGH (ref 0.0–1.2)
CO2: 26 mmol/L (ref 20–29)
Calcium: 9.2 mg/dL (ref 8.7–10.3)
Chloride: 101 mmol/L (ref 96–106)
Creatinine, Ser: 0.78 mg/dL (ref 0.57–1.00)
Globulin, Total: 2.3 g/dL (ref 1.5–4.5)
Glucose: 110 mg/dL — ABNORMAL HIGH (ref 70–99)
Potassium: 4.7 mmol/L (ref 3.5–5.2)
Sodium: 142 mmol/L (ref 134–144)
Total Protein: 7.1 g/dL (ref 6.0–8.5)
eGFR: 81 mL/min/{1.73_m2} (ref >59)

## 2021-03-30 LAB — LIPID PANEL
Chol/HDL Ratio: 2.9 ratio (ref 0.0–4.4)
Cholesterol, Total: 117 mg/dL (ref 100–199)
HDL: 40 mg/dL (ref >39)
LDL Chol Calc (NIH): 53 mg/dL (ref 0–99)
Triglycerides: 140 mg/dL (ref 0–149)
VLDL Cholesterol Cal: 24 mg/dL (ref 5–40)

## 2021-03-30 LAB — CBC WITH DIFFERENTIAL/PLATELET
Basophils Absolute: 0 10*3/uL (ref 0.0–0.2)
Basos: 1 %
EOS (ABSOLUTE): 0.2 10*3/uL (ref 0.0–0.4)
Eos: 4 %
Hematocrit: 41.7 % (ref 34.0–46.6)
Hemoglobin: 13.4 g/dL (ref 11.1–15.9)
Immature Grans (Abs): 0 10*3/uL (ref 0.0–0.1)
Immature Granulocytes: 0 %
Lymphocytes Absolute: 1.4 10*3/uL (ref 0.7–3.1)
Lymphs: 21 %
MCH: 28.4 pg (ref 26.6–33.0)
MCHC: 32.1 g/dL (ref 31.5–35.7)
MCV: 88 fL (ref 79–97)
Monocytes Absolute: 0.5 10*3/uL (ref 0.1–0.9)
Monocytes: 7 %
Neutrophils Absolute: 4.5 10*3/uL (ref 1.4–7.0)
Neutrophils: 67 %
Platelets: 265 10*3/uL (ref 150–450)
RBC: 4.72 x10E6/uL (ref 3.77–5.28)
RDW: 13.6 % (ref 11.7–15.4)
WBC: 6.6 10*3/uL (ref 3.4–10.8)

## 2021-04-15 DIAGNOSIS — C44719 Basal cell carcinoma of skin of left lower limb, including hip: Secondary | ICD-10-CM | POA: Diagnosis not present

## 2021-04-15 DIAGNOSIS — C4359 Malignant melanoma of other part of trunk: Secondary | ICD-10-CM | POA: Diagnosis not present

## 2021-04-15 DIAGNOSIS — B078 Other viral warts: Secondary | ICD-10-CM | POA: Diagnosis not present

## 2021-05-05 ENCOUNTER — Ambulatory Visit: Payer: Self-pay | Admitting: Surgery

## 2021-05-05 DIAGNOSIS — C4359 Malignant melanoma of other part of trunk: Secondary | ICD-10-CM

## 2021-05-05 NOTE — H&P (View-Only) (Signed)
?Mckenzie Leonard ?[id number]  ? ?Referring Provider:  Eulah Leonard* ? ? ?Subjective  ? ?Chief Complaint: Melanoma ?  ? ? ?History of Present Illness: ?   ?71 year old woman with history of hypertension, hyperlipidemia, type 2 diabetes, hypothyroidism, peripheral arterial disease, hypokalemia, peripheral edema, vitamin D deficiency, osteopenia, obesity, is referred for further treatment of a melanoma on her back. ?She noticed a lesion in September and initially thought that it was a tick.  It increased in size and was evaluated by her primary care doctor in January, followed by biopsy last month dermatology with path revealing a pT3a melanoma, 3.8 mm deep with a positive deep margin, mitotic rate 6/mm?Marland Kitchen ?She denies any systemic symptoms, has not noted any lymphadenopathy.  Denies any family history of skin cancer, but does note a fair amount of sun exposure in her teen years. ? ? ?Review of Systems: ?A complete review of systems was obtained from the patient.  I have reviewed this information and discussed as appropriate with the patient.  See HPI as well for other ROS. ? ? ?Medical History: ?Past Medical History:  ?Diagnosis Date  ? Arthritis   ? Diabetes mellitus without complication (CMS-HCC)   ? Hyperlipidemia   ? Thyroid disease   ? ? ?There is no problem list on file for this patient. ? ? ?Past Surgical History:  ?Procedure Laterality Date  ? ARTHROSCOPY KNEE Left   ? carpal tunnel surgery N/A   ? left knee replcaement Left   ? right face surgery Right   ?  ? ?Allergies  ?Allergen Reactions  ? Other Anaphylaxis  ? Sulfacetamide Sodium Anaphylaxis  ? Rosuvastatin Calcium Other (See Comments)  ? Statins-Hmg-Coa Reductase Inhibitors Other (See Comments)  ?  One of them caused muscle pain  ? Sulfamethoxazole Swelling  ? Tetanus Toxoid Adsorbed Other (See Comments)  ? Tetanus Toxoid, Adsorbed Other (See Comments)  ?  Reports flu like symptoms and swelling at injection site only  ? Tetanus And Diphtheria  Toxoids Other (See Comments)  ?  Reports flu like symptoms and swelling at injection site only  ? ? ?Current Outpatient Medications on File Prior to Visit  ?Medication Sig Dispense Refill  ? levothyroxine (SYNTHROID) 50 MCG tablet Take by mouth    ? olmesartan (BENICAR) 40 MG tablet Take 40 mg by mouth once daily    ? ascorbic acid, vitamin C, (VITAMIN C) 100 MG tablet Take 100 mg by mouth once daily    ? aspirin 81 MG EC tablet Take by mouth    ? atorvastatin (LIPITOR) 10 MG tablet Take 10 mg by mouth once daily    ? calcium carbonate-vitamin D3 (OS-CAL 500+D) 500 mg-5 mcg (200 unit) tablet Take 1 tablet by mouth 2 (two) times daily    ? cholecalciferol (VITAMIN D3) 2,000 unit capsule Take by mouth    ? magnesium gluconate (MAGONATE) 27.5 mg magne- sium (500 mg) tablet Take by mouth    ? ONETOUCH ULTRA TEST test strip CHECK BLOOD SUGAR DAILY AS DIRECTED    ? ?No current facility-administered medications on file prior to visit.  ? ? ?Family History  ?Problem Relation Age of Onset  ? Stroke Mother   ? Heart valve disease Mother   ? High blood pressure (Hypertension) Father   ? Diabetes Father   ? Breast cancer Sister   ?  ? ?Social History  ? ?Tobacco Use  ?Smoking Status Never  ?Smokeless Tobacco Never  ?  ? ?Social History  ? ?  Socioeconomic History  ? Marital status: Divorced  ?Tobacco Use  ? Smoking status: Never  ? Smokeless tobacco: Never  ?Vaping Use  ? Vaping Use: Never used  ?Substance and Sexual Activity  ? Alcohol use: Never  ? Drug use: Never  ? ? ?Objective:  ? ? ?Vitals:  ? 05/05/21 1051  ?Pulse: 93  ?Temp: 36.3 ?C (97.3 ?F)  ?SpO2: 97%  ?Weight: 92 kg (202 lb 12.8 oz)  ?Height: 157.5 cm ('5\' 2"'$ )  ?  ?Body mass index is 37.09 kg/m?. ? ?Alert and well-appearing ?Unlabored respirations ?On the left upper back lateral to the scapula is healing biopsy site approximately 1.5 cm in maximal diameter without underlying fluctuance or swelling or surrounding erythema.  There is no palpable left axillary or  supraclavicular or cervical lymphadenopathy. ?Assessment and Plan:  ?Diagnoses and all orders for this visit: ? ?Malignant melanoma of torso excluding breast (CMS-HCC) ?-     NM lymphoscintigraphy ? ?  ?Given pathologic findings recommend proceeding with wide local excision with sentinel lymph node biopsy.  She will need preoperative lymphoscintigraphy at least 2 days prior to surgery to confirm appropriate basin which I expect will be the left axilla given location of the lesion.  Discussed the procedure including technique, risks of bleeding, infection, pain, scarring, wound healing problems, injury to underlying structures, lymphedema.  Questions welcomed and answered.  Schedule soon as possible. ? ?Casey Fye Raquel James, MD  ?

## 2021-05-05 NOTE — H&P (Signed)
?Mckenzie Leonard ?[id number]  ? ?Referring Provider:  Eulah Citizen* ? ? ?Subjective  ? ?Chief Complaint: Melanoma ?  ? ? ?History of Present Illness: ?   ?71 year old woman with history of hypertension, hyperlipidemia, type 2 diabetes, hypothyroidism, peripheral arterial disease, hypokalemia, peripheral edema, vitamin D deficiency, osteopenia, obesity, is referred for further treatment of a melanoma on her back. ?She noticed a lesion in September and initially thought that it was a tick.  It increased in size and was evaluated by her primary care doctor in January, followed by biopsy last month dermatology with path revealing a pT3a melanoma, 3.8 mm deep with a positive deep margin, mitotic rate 6/mm?Marland Kitchen ?She denies any systemic symptoms, has not noted any lymphadenopathy.  Denies any family history of skin cancer, but does note a fair amount of sun exposure in her teen years. ? ? ?Review of Systems: ?A complete review of systems was obtained from the patient.  I have reviewed this information and discussed as appropriate with the patient.  See HPI as well for other ROS. ? ? ?Medical History: ?Past Medical History:  ?Diagnosis Date  ? Arthritis   ? Diabetes mellitus without complication (CMS-HCC)   ? Hyperlipidemia   ? Thyroid disease   ? ? ?There is no problem list on file for this patient. ? ? ?Past Surgical History:  ?Procedure Laterality Date  ? ARTHROSCOPY KNEE Left   ? carpal tunnel surgery N/A   ? left knee replcaement Left   ? right face surgery Right   ?  ? ?Allergies  ?Allergen Reactions  ? Other Anaphylaxis  ? Sulfacetamide Sodium Anaphylaxis  ? Rosuvastatin Calcium Other (See Comments)  ? Statins-Hmg-Coa Reductase Inhibitors Other (See Comments)  ?  One of them caused muscle pain  ? Sulfamethoxazole Swelling  ? Tetanus Toxoid Adsorbed Other (See Comments)  ? Tetanus Toxoid, Adsorbed Other (See Comments)  ?  Reports flu like symptoms and swelling at injection site only  ? Tetanus And Diphtheria  Toxoids Other (See Comments)  ?  Reports flu like symptoms and swelling at injection site only  ? ? ?Current Outpatient Medications on File Prior to Visit  ?Medication Sig Dispense Refill  ? levothyroxine (SYNTHROID) 50 MCG tablet Take by mouth    ? olmesartan (BENICAR) 40 MG tablet Take 40 mg by mouth once daily    ? ascorbic acid, vitamin C, (VITAMIN C) 100 MG tablet Take 100 mg by mouth once daily    ? aspirin 81 MG EC tablet Take by mouth    ? atorvastatin (LIPITOR) 10 MG tablet Take 10 mg by mouth once daily    ? calcium carbonate-vitamin D3 (OS-CAL 500+D) 500 mg-5 mcg (200 unit) tablet Take 1 tablet by mouth 2 (two) times daily    ? cholecalciferol (VITAMIN D3) 2,000 unit capsule Take by mouth    ? magnesium gluconate (MAGONATE) 27.5 mg magne- sium (500 mg) tablet Take by mouth    ? ONETOUCH ULTRA TEST test strip CHECK BLOOD SUGAR DAILY AS DIRECTED    ? ?No current facility-administered medications on file prior to visit.  ? ? ?Family History  ?Problem Relation Age of Onset  ? Stroke Mother   ? Heart valve disease Mother   ? High blood pressure (Hypertension) Father   ? Diabetes Father   ? Breast cancer Sister   ?  ? ?Social History  ? ?Tobacco Use  ?Smoking Status Never  ?Smokeless Tobacco Never  ?  ? ?Social History  ? ?  Socioeconomic History  ? Marital status: Divorced  ?Tobacco Use  ? Smoking status: Never  ? Smokeless tobacco: Never  ?Vaping Use  ? Vaping Use: Never used  ?Substance and Sexual Activity  ? Alcohol use: Never  ? Drug use: Never  ? ? ?Objective:  ? ? ?Vitals:  ? 05/05/21 1051  ?Pulse: 93  ?Temp: 36.3 ?C (97.3 ?F)  ?SpO2: 97%  ?Weight: 92 kg (202 lb 12.8 oz)  ?Height: 157.5 cm ('5\' 2"'$ )  ?  ?Body mass index is 37.09 kg/m?. ? ?Alert and well-appearing ?Unlabored respirations ?On the left upper back lateral to the scapula is healing biopsy site approximately 1.5 cm in maximal diameter without underlying fluctuance or swelling or surrounding erythema.  There is no palpable left axillary or  supraclavicular or cervical lymphadenopathy. ?Assessment and Plan:  ?Diagnoses and all orders for this visit: ? ?Malignant melanoma of torso excluding breast (CMS-HCC) ?-     NM lymphoscintigraphy ? ?  ?Given pathologic findings recommend proceeding with wide local excision with sentinel lymph node biopsy.  She will need preoperative lymphoscintigraphy at least 2 days prior to surgery to confirm appropriate basin which I expect will be the left axilla given location of the lesion.  Discussed the procedure including technique, risks of bleeding, infection, pain, scarring, wound healing problems, injury to underlying structures, lymphedema.  Questions welcomed and answered.  Schedule soon as possible. ? ?Aleecia Tapia Raquel James, MD  ?

## 2021-05-06 ENCOUNTER — Other Ambulatory Visit (HOSPITAL_COMMUNITY): Payer: Self-pay | Admitting: Surgery

## 2021-05-06 ENCOUNTER — Other Ambulatory Visit: Payer: Self-pay | Admitting: Surgery

## 2021-05-06 DIAGNOSIS — C4359 Malignant melanoma of other part of trunk: Secondary | ICD-10-CM

## 2021-05-13 NOTE — Pre-Procedure Instructions (Signed)
Surgical Instructions ? ? ? Your procedure is scheduled on Monday, March 27th. ? Report to [facility] Main Entrance "A" at 12:30 P.M., then check in with the Admitting office. ? Call this number if you have problems the morning of surgery: ? [phone] ? ? If you have any questions prior to your surgery date call [phone]: Open Monday-Friday 8am-4pm ? ? ? Remember: ? Do not eat or drink after midnight the night before your surgery ? ? Take these medicines the morning of surgery with A SIP OF WATER  ?atorvastatin (LIPITOR) ?levothyroxine (SYNTHROID) ?oxymetazoline (AFRIN)-as needed ? ?Follow your surgeon's instructions on when to stop Aspirin.  If no instructions were given by your surgeon then you will need to call the office to get those instructions.   ? ?7 days prior to surgery, STOP taking any Aspirin (unless otherwise instructed by your surgeon) Aleve, Naproxen, Ibuprofen, Motrin, Advil, Goody's, BC's, all herbal medications, fish oil, and all vitamins. ?         ?           ?Do NOT Smoke (Tobacco/Vaping) for 24 hours prior to your procedure. ? ?If you use a CPAP at night, you may bring your mask/headgear for your overnight stay. ?  ?Contacts, glasses, piercing's, hearing aid's, dentures or partials may not be worn into surgery, please bring cases for these belongings.  ?  ?For patients admitted to the hospital, discharge time will be determined by your treatment team. ?  ?Patients discharged the day of surgery will not be allowed to drive home, and someone needs to stay with them for 24 hours. ? ?NO VISITORS WILL BE ALLOWED IN PRE-OP WHERE PATIENTS ARE PREPPED FOR SURGERY.  ONLY 1 SUPPORT PERSON MAY BE PRESENT IN THE WAITING ROOM WHILE YOU ARE IN SURGERY.  IF YOU ARE TO BE ADMITTED, ONCE YOU ARE IN YOUR ROOM YOU WILL BE ALLOWED TWO (2) VISITORS. (1) VISITOR MAY STAY OVERNIGHT BUT MUST ARRIVE TO THE ROOM BY 8pm.  Minor children may have two parents present. Special consideration for safety and  communication needs will be reviewed on a case by case basis. ? ? ?Special instructions:   ?[facility]- Preparing For Surgery ? ?Before surgery, you can play an important role. Because skin is not sterile, your skin needs to be as free of germs as possible. You can reduce the number of germs on your skin by washing with CHG (chlorahexidine gluconate) Soap before surgery.  CHG is an antiseptic cleaner which kills germs and bonds with the skin to continue killing germs even after washing.   ? ?Oral Hygiene is also important to reduce your risk of infection.  Remember - BRUSH YOUR TEETH THE MORNING OF SURGERY WITH YOUR REGULAR TOOTHPASTE ? ?Please do not use if you have an allergy to CHG or antibacterial soaps. If your skin becomes reddened/irritated stop using the CHG.  ?Do not shave (including legs and underarms) for at least 48 hours prior to first CHG shower. It is OK to shave your face. ? ?Please follow these instructions carefully. ?  ?Shower the NIGHT BEFORE SURGERY and the MORNING OF SURGERY ? ?If you chose to wash your hair, wash your hair first as usual with your normal shampoo. ? ?After you shampoo, rinse your hair and body thoroughly to remove the shampoo. ? ?Use CHG Soap as you would any other liquid soap. You can apply CHG directly to the skin and wash gently with a scrungie or a clean washcloth.  ? ?  Apply the CHG Soap to your body ONLY FROM THE NECK DOWN.  Do not use on open wounds or open sores. Avoid contact with your eyes, ears, mouth and genitals (private parts). Wash Face and genitals (private parts)  with your normal soap.  ? ?Wash thoroughly, paying special attention to the area where your surgery will be performed. ? ?Thoroughly rinse your body with warm water from the neck down. ? ?DO NOT shower/wash with your normal soap after using and rinsing off the CHG Soap. ? ?Pat yourself dry with a CLEAN TOWEL. ? ?Wear CLEAN PAJAMAS to bed the night before surgery ? ?Place CLEAN SHEETS on your bed the  night before your surgery ? ?DO NOT SLEEP WITH PETS. ? ? ?Day of Surgery: ?Take a shower with CHG soap. ?Do not wear jewelry or makeup ?Do not wear lotions, powders, perfumes, or deodorant. ?Do not shave 48 hours prior to surgery.   ?Do not bring valuables to the hospital.  ?[facility] is not responsible for any belongings or valuables. ?Do not wear nail polish, gel polish, artificial nails, or any other type of covering on natural nails (fingers and toes) ?If you have artificial nails or gel coating that need to be removed by a nail salon, please have this removed prior to surgery. Artificial nails or gel coating may interfere with anesthesia's ability to adequately monitor your vital signs. ?Wear Clean/Comfortable clothing the morning of surgery ?Do not apply any deodorants/lotions.   ?Remember to brush your teeth WITH YOUR REGULAR TOOTHPASTE. ?  ?Please read over the following fact sheets that you were given. ? ?? Notify your provider: ? ?if you are in close contact with someone who has COVID ? ?or if you develop a fever of 100.4 or greater, sneezing, cough, sore throat, shortness of breath or body aches.  ? ?

## 2021-05-14 ENCOUNTER — Ambulatory Visit (HOSPITAL_COMMUNITY)
Admission: RE | Admit: 2021-05-14 | Discharge: 2021-05-14 | Disposition: A | Discharge status: Home / Self Care | Payer: Medicare Other | Source: Ambulatory Visit | Attending: Surgery | Admitting: Surgery

## 2021-05-14 ENCOUNTER — Encounter (HOSPITAL_COMMUNITY): Payer: Self-pay

## 2021-05-14 ENCOUNTER — Encounter (HOSPITAL_COMMUNITY)
Admission: RE | Admit: 2021-05-14 | Discharge: 2021-05-14 | Disposition: A | Discharge status: Home / Self Care | Payer: Medicare Other | Source: Ambulatory Visit | Attending: Surgery | Admitting: Surgery

## 2021-05-14 ENCOUNTER — Other Ambulatory Visit: Payer: Self-pay

## 2021-05-14 VITALS — BP 160/79 | HR 75 | Temp 98.3°F | Resp 17 | Ht 62.0 in | Wt 203.9 lb

## 2021-05-14 DIAGNOSIS — Z01810 Encounter for preprocedural cardiovascular examination: Secondary | ICD-10-CM

## 2021-05-14 DIAGNOSIS — E119 Type 2 diabetes mellitus without complications: Secondary | ICD-10-CM | POA: Insufficient documentation

## 2021-05-14 DIAGNOSIS — Z01818 Encounter for other preprocedural examination: Secondary | ICD-10-CM

## 2021-05-14 DIAGNOSIS — M47814 Spondylosis without myelopathy or radiculopathy, thoracic region: Secondary | ICD-10-CM | POA: Diagnosis not present

## 2021-05-14 LAB — CBC
HCT: 42.7 % (ref 36.0–46.0)
Hemoglobin: 14.1 g/dL (ref 12.0–15.0)
MCH: 29.4 pg (ref 26.0–34.0)
MCHC: 33 g/dL (ref 30.0–36.0)
MCV: 89 fL (ref 80.0–100.0)
Platelets: 288 10*3/uL (ref 150–400)
RBC: 4.8 MIL/uL (ref 3.87–5.11)
RDW: 14.3 % (ref 11.5–15.5)
WBC: 8.2 10*3/uL (ref 4.0–10.5)
nRBC: 0 % (ref 0.0–0.2)

## 2021-05-14 LAB — BASIC METABOLIC PANEL
Anion gap: 12 (ref 5–15)
BUN: 9 mg/dL (ref 8–23)
CO2: 25 mmol/L (ref 22–32)
Calcium: 9 mg/dL (ref 8.9–10.3)
Chloride: 103 mmol/L (ref 98–111)
Creatinine, Ser: 0.67 mg/dL (ref 0.44–1.00)
GFR, Estimated: >60 mL/min (ref >60)
Glucose, Bld: 98 mg/dL (ref 70–99)
Potassium: 3.9 mmol/L (ref 3.5–5.1)
Sodium: 140 mmol/L (ref 135–145)

## 2021-05-14 LAB — GLUCOSE, CAPILLARY: Glucose-Capillary: 90 mg/dL (ref 70–99)

## 2021-05-14 NOTE — Progress Notes (Addendum)
PCP - Hassell Done, Mary-Margaret ?Cardiologist - denies ? ?PPM/ICD - denies ?Device Orders - n/a ?Rep Notified - n/a ? ?Chest x-ray - 05/14/2021 ?EKG - 11/04/2020 -  EKG tracing requested - not available per [facility] staff ?Stress Test - denies ?ECHO - n/a ?Cardiac Cath - n/a ? ?Sleep Study - denies ?CPAP - n/a ? ?Fasting Blood Sugar - 114 - 143 ?Checks Blood Sugar once a day ?CBG today - 90 ?A1C - 6.0 (03/29/2021) ? ?Blood Thinner Instructions: n/a ? ?Aspirin Instructions: patient was instructed: Follow your surgeon's instructions on when to stop Aspirin.  If no instructions were given by your surgeon then you will need to call the office to get those instructions.    ? ?Patient was instructed: 7 days prior to surgery, STOP taking any Aspirin (unless otherwise instructed by your surgeon) Aleve, Naproxen, Ibuprofen, Motrin, Advil, Goody's, BC's, all herbal medications, fish oil, and all vitamins ? ?ERAS Protcol - n/a ? ? ?COVID TEST- no - ambulatory surgery ? ? ?Anesthesia review: no ? ?Patient denies shortness of breath, fever, cough and chest pain at PAT appointment ? ? ?All instructions explained to the patient, with a verbal understanding of the material. Patient agrees to go over the instructions while at home for a better understanding. Patient also instructed to self quarantine after being tested for COVID-19. The opportunity to ask questions was provided. ?  ?

## 2021-05-14 NOTE — Progress Notes (Signed)
Patient was in PAT today for labs and instructions for surgery on 05/24/2021. Patient has history of HTN and DM and according with anesthesia protocol patient needs to have an EKG done in the last 12 months. Patient had an EKG on 11/04/2020 at [facility] and the EKG tracing was requested. Staff from [facility] was called and they said that they don't have records for this patient. EKG will be done the day of surgery. ?

## 2021-05-17 ENCOUNTER — Ambulatory Visit (HOSPITAL_COMMUNITY)
Admission: RE | Admit: 2021-05-17 | Discharge: 2021-05-17 | Disposition: A | Discharge status: Home / Self Care | Payer: Medicare Other | Source: Ambulatory Visit | Attending: Surgery | Admitting: Surgery

## 2021-05-17 ENCOUNTER — Other Ambulatory Visit: Payer: Self-pay

## 2021-05-17 DIAGNOSIS — C4359 Malignant melanoma of other part of trunk: Secondary | ICD-10-CM | POA: Diagnosis not present

## 2021-05-17 DIAGNOSIS — R590 Localized enlarged lymph nodes: Secondary | ICD-10-CM | POA: Diagnosis not present

## 2021-05-17 DIAGNOSIS — C439 Malignant melanoma of skin, unspecified: Secondary | ICD-10-CM | POA: Diagnosis not present

## 2021-05-17 MED ORDER — TECHNETIUM TC 99M TILMANOCEPT KIT
500.0000 | PACK | Freq: Once | INTRAVENOUS | Status: AC | PRN
  Administered 2021-05-17: 500 via INTRADERMAL

## 2021-05-24 ENCOUNTER — Other Ambulatory Visit: Payer: Self-pay

## 2021-05-24 ENCOUNTER — Ambulatory Visit (HOSPITAL_COMMUNITY)
Admission: RE | Admit: 2021-05-24 | Discharge: 2021-05-24 | Disposition: A | Discharge status: Home / Self Care | Payer: Medicare Other | Attending: Surgery | Admitting: Surgery

## 2021-05-24 ENCOUNTER — Ambulatory Visit (HOSPITAL_COMMUNITY)
Admission: RE | Admit: 2021-05-24 | Discharge: 2021-05-24 | Disposition: A | Discharge status: Home / Self Care | Payer: Medicare Other | Source: Ambulatory Visit | Attending: Surgery | Admitting: Surgery

## 2021-05-24 ENCOUNTER — Encounter (HOSPITAL_COMMUNITY)
Admission: RE | Disposition: A | Discharge status: Home / Self Care | Payer: Self-pay | Source: Home / Self Care | Attending: Surgery

## 2021-05-24 ENCOUNTER — Encounter (HOSPITAL_COMMUNITY): Payer: Self-pay | Admitting: Surgery

## 2021-05-24 ENCOUNTER — Ambulatory Visit (HOSPITAL_BASED_OUTPATIENT_CLINIC_OR_DEPARTMENT_OTHER): Payer: Medicare Other | Admitting: Anesthesiology

## 2021-05-24 ENCOUNTER — Ambulatory Visit (HOSPITAL_COMMUNITY): Payer: Medicare Other | Admitting: Anesthesiology

## 2021-05-24 DIAGNOSIS — I10 Essential (primary) hypertension: Secondary | ICD-10-CM | POA: Diagnosis not present

## 2021-05-24 DIAGNOSIS — C439 Malignant melanoma of skin, unspecified: Secondary | ICD-10-CM | POA: Diagnosis not present

## 2021-05-24 DIAGNOSIS — E1151 Type 2 diabetes mellitus with diabetic peripheral angiopathy without gangrene: Secondary | ICD-10-CM

## 2021-05-24 DIAGNOSIS — Z6837 Body mass index (BMI) 37.0-37.9, adult: Secondary | ICD-10-CM | POA: Diagnosis not present

## 2021-05-24 DIAGNOSIS — M1990 Unspecified osteoarthritis, unspecified site: Secondary | ICD-10-CM | POA: Insufficient documentation

## 2021-05-24 DIAGNOSIS — E669 Obesity, unspecified: Secondary | ICD-10-CM | POA: Diagnosis not present

## 2021-05-24 DIAGNOSIS — C4359 Malignant melanoma of other part of trunk: Secondary | ICD-10-CM | POA: Diagnosis not present

## 2021-05-24 DIAGNOSIS — L988 Other specified disorders of the skin and subcutaneous tissue: Secondary | ICD-10-CM | POA: Diagnosis not present

## 2021-05-24 DIAGNOSIS — E039 Hypothyroidism, unspecified: Secondary | ICD-10-CM | POA: Insufficient documentation

## 2021-05-24 HISTORY — PX: EXCISION MELANOMA WITH SENTINEL LYMPH NODE BIOPSY: SHX5628

## 2021-05-24 HISTORY — PX: SENTINEL NODE BIOPSY: SHX6608

## 2021-05-24 LAB — GLUCOSE, CAPILLARY
Glucose-Capillary: 102 mg/dL — ABNORMAL HIGH (ref 70–99)
Glucose-Capillary: 125 mg/dL — ABNORMAL HIGH (ref 70–99)

## 2021-05-24 SURGERY — EXCISION, MELANOMA, WITH SENTINEL LYMPH NODE BIOPSY
Anesthesia: General | Site: Back | Laterality: Left

## 2021-05-24 MED ORDER — OXYCODONE HCL 5 MG PO TABS: 5.0000 mg | ORAL_TABLET | Freq: Once | ORAL | Status: AC | PRN

## 2021-05-24 MED ORDER — OXYCODONE HCL 5 MG PO TABS: 5.0000 mg | ORAL_TABLET | ORAL | Status: DC | PRN

## 2021-05-24 MED ORDER — ACETAMINOPHEN 325 MG PO TABS: 650.0000 mg | ORAL_TABLET | ORAL | Status: DC | PRN

## 2021-05-24 MED ORDER — AMISULPRIDE (ANTIEMETIC) 5 MG/2ML IV SOLN: 10.0000 mg | Freq: Once | INTRAVENOUS | Status: DC | PRN

## 2021-05-24 MED ORDER — DOCUSATE SODIUM 100 MG PO CAPS: 100.0000 mg | ORAL_CAPSULE | Freq: Two times a day (BID) | ORAL | 0 refills | Status: AC

## 2021-05-24 MED ORDER — ACETAMINOPHEN 500 MG PO TABS
1000.0000 mg | ORAL_TABLET | Freq: Once | ORAL | Status: AC
  Administered 2021-05-24: 1000 mg via ORAL
  Filled 2021-05-24: qty 2

## 2021-05-24 MED ORDER — LACTATED RINGERS IV SOLN: INTRAVENOUS | Status: DC

## 2021-05-24 MED ORDER — SODIUM CHLORIDE 0.9% FLUSH: 3.0000 mL | INTRAVENOUS | Status: DC | PRN

## 2021-05-24 MED ORDER — METHOCARBAMOL 500 MG PO TABS: 500.0000 mg | ORAL_TABLET | Freq: Four times a day (QID) | ORAL | 0 refills | Status: DC | PRN

## 2021-05-24 MED ORDER — OXYCODONE HCL 5 MG/5ML PO SOLN: 5.0000 mg | Freq: Once | ORAL | Status: AC | PRN

## 2021-05-24 MED ORDER — SUGAMMADEX SODIUM 200 MG/2ML IV SOLN
INTRAVENOUS | Status: DC | PRN
  Administered 2021-05-24: 200 mg via INTRAVENOUS

## 2021-05-24 MED ORDER — MIDAZOLAM HCL 2 MG/2ML IJ SOLN
INTRAMUSCULAR | Status: DC | PRN
  Administered 2021-05-24 (×2): 1 mg via INTRAVENOUS

## 2021-05-24 MED ORDER — DEXAMETHASONE SODIUM PHOSPHATE 10 MG/ML IJ SOLN
INTRAMUSCULAR | Status: DC | PRN
  Administered 2021-05-24: 5 mg via INTRAVENOUS

## 2021-05-24 MED ORDER — OXYCODONE HCL 5 MG PO TABS
ORAL_TABLET | ORAL | Status: AC
  Administered 2021-05-24: 5 mg via ORAL
  Filled 2021-05-24: qty 1

## 2021-05-24 MED ORDER — ONDANSETRON HCL 4 MG/2ML IJ SOLN: 4.0000 mg | Freq: Once | INTRAMUSCULAR | Status: DC | PRN

## 2021-05-24 MED ORDER — FENTANYL CITRATE (PF) 250 MCG/5ML IJ SOLN
INTRAMUSCULAR | Status: DC | PRN
  Administered 2021-05-24: 100 ug via INTRAVENOUS
  Administered 2021-05-24 (×3): 50 ug via INTRAVENOUS

## 2021-05-24 MED ORDER — METHYLENE BLUE 1 % INJ SOLN
INTRAVENOUS | Status: DC | PRN
  Administered 2021-05-24: 2 mL

## 2021-05-24 MED ORDER — BUPIVACAINE-EPINEPHRINE (PF) 0.25% -1:200000 IJ SOLN
INTRAMUSCULAR | Status: DC | PRN
  Administered 2021-05-24: 3 mL
  Administered 2021-05-24: 7 mL

## 2021-05-24 MED ORDER — FENTANYL CITRATE (PF) 100 MCG/2ML IJ SOLN: 25.0000 ug | INTRAMUSCULAR | Status: DC | PRN

## 2021-05-24 MED ORDER — CHLORHEXIDINE GLUCONATE 4 % EX LIQD: 60.0000 mL | Freq: Once | CUTANEOUS | Status: DC

## 2021-05-24 MED ORDER — ACETAMINOPHEN 650 MG RE SUPP: 650.0000 mg | RECTAL | Status: DC | PRN

## 2021-05-24 MED ORDER — SUCCINYLCHOLINE CHLORIDE 200 MG/10ML IV SOSY
PREFILLED_SYRINGE | INTRAVENOUS | Status: DC | PRN
  Administered 2021-05-24: 140 mg via INTRAVENOUS

## 2021-05-24 MED ORDER — ORAL CARE MOUTH RINSE: 15.0000 mL | Freq: Once | OROMUCOSAL | Status: AC

## 2021-05-24 MED ORDER — TECHNETIUM TC 99M TILMANOCEPT KIT
0.5000 | PACK | Freq: Once | INTRAVENOUS | Status: AC | PRN
  Administered 2021-05-24: 0.5 via INTRADERMAL

## 2021-05-24 MED ORDER — CHLORHEXIDINE GLUCONATE 0.12 % MT SOLN
15.0000 mL | Freq: Once | OROMUCOSAL | Status: AC
  Administered 2021-05-24: 15 mL via OROMUCOSAL
  Filled 2021-05-24: qty 15

## 2021-05-24 MED ORDER — HYDROMORPHONE HCL 1 MG/ML IJ SOLN
0.2500 mg | INTRAMUSCULAR | Status: DC | PRN
  Administered 2021-05-24: 0.25 mg via INTRAVENOUS

## 2021-05-24 MED ORDER — CEFAZOLIN SODIUM-DEXTROSE 2-4 GM/100ML-% IV SOLN
2.0000 g | INTRAVENOUS | Status: AC
  Administered 2021-05-24: 2 g via INTRAVENOUS
  Filled 2021-05-24: qty 100

## 2021-05-24 MED ORDER — LIDOCAINE 2% (20 MG/ML) 5 ML SYRINGE
INTRAMUSCULAR | Status: DC | PRN
  Administered 2021-05-24: 100 mg via INTRAVENOUS

## 2021-05-24 MED ORDER — PROPOFOL 10 MG/ML IV BOLUS
INTRAVENOUS | Status: DC | PRN
Start: 2021-05-24 — End: 2021-05-24
  Administered 2021-05-24: 100 mg via INTRAVENOUS
  Administered 2021-05-24: 50 mg via INTRAVENOUS

## 2021-05-24 MED ORDER — SODIUM CHLORIDE 0.9 % IV SOLN: 250.0000 mL | INTRAVENOUS | Status: DC | PRN

## 2021-05-24 MED ORDER — METHYLENE BLUE 0.5 % INJ SOLN
INTRAVENOUS | Status: AC
  Filled 2021-05-24: qty 10

## 2021-05-24 MED ORDER — PROPOFOL 10 MG/ML IV BOLUS
INTRAVENOUS | Status: AC
  Filled 2021-05-24: qty 20

## 2021-05-24 MED ORDER — MIDAZOLAM HCL 2 MG/2ML IJ SOLN
INTRAMUSCULAR | Status: AC
  Filled 2021-05-24: qty 2

## 2021-05-24 MED ORDER — OXYCODONE HCL 5 MG PO TABS: 5.0000 mg | ORAL_TABLET | Freq: Three times a day (TID) | ORAL | 0 refills | Status: AC | PRN

## 2021-05-24 MED ORDER — SODIUM CHLORIDE 0.9% FLUSH: 3.0000 mL | Freq: Two times a day (BID) | INTRAVENOUS | Status: DC

## 2021-05-24 MED ORDER — PHENYLEPHRINE HCL-NACL 20-0.9 MG/250ML-% IV SOLN
INTRAVENOUS | Status: DC | PRN
  Administered 2021-05-24: 50 ug/min via INTRAVENOUS

## 2021-05-24 MED ORDER — BUPIVACAINE-EPINEPHRINE (PF) 0.25% -1:200000 IJ SOLN
INTRAMUSCULAR | Status: AC
  Filled 2021-05-24: qty 30

## 2021-05-24 MED ORDER — INSULIN ASPART 100 UNIT/ML IJ SOLN: 0.0000 [IU] | INTRAMUSCULAR | Status: DC | PRN

## 2021-05-24 MED ORDER — SCOPOLAMINE 1 MG/3DAYS TD PT72
1.0000 | MEDICATED_PATCH | TRANSDERMAL | Status: DC
  Administered 2021-05-24: 1.5 mg via TRANSDERMAL
  Filled 2021-05-24: qty 1

## 2021-05-24 MED ORDER — 0.9 % SODIUM CHLORIDE (POUR BTL) OPTIME
TOPICAL | Status: DC | PRN
  Administered 2021-05-24: 1000 mL

## 2021-05-24 MED ORDER — ONDANSETRON HCL 4 MG/2ML IJ SOLN
INTRAMUSCULAR | Status: DC | PRN
  Administered 2021-05-24: 4 mg via INTRAVENOUS

## 2021-05-24 MED ORDER — GABAPENTIN 300 MG PO CAPS
300.0000 mg | ORAL_CAPSULE | ORAL | Status: DC
  Filled 2021-05-24: qty 1

## 2021-05-24 MED ORDER — PHENYLEPHRINE 40 MCG/ML (10ML) SYRINGE FOR IV PUSH (FOR BLOOD PRESSURE SUPPORT)
PREFILLED_SYRINGE | INTRAVENOUS | Status: DC | PRN
  Administered 2021-05-24 (×4): 40 ug via INTRAVENOUS
  Administered 2021-05-24 (×2): 80 ug via INTRAVENOUS

## 2021-05-24 MED ORDER — FENTANYL CITRATE (PF) 250 MCG/5ML IJ SOLN
INTRAMUSCULAR | Status: AC
  Filled 2021-05-24: qty 5

## 2021-05-24 MED ORDER — BUPIVACAINE LIPOSOME 1.3 % IJ SUSP: 20.0000 mL | Freq: Once | INTRAMUSCULAR | Status: DC

## 2021-05-24 MED ORDER — HYDROMORPHONE HCL 1 MG/ML IJ SOLN
INTRAMUSCULAR | Status: AC
  Filled 2021-05-24: qty 1

## 2021-05-24 MED ORDER — ROCURONIUM BROMIDE 10 MG/ML (PF) SYRINGE
PREFILLED_SYRINGE | INTRAVENOUS | Status: DC | PRN
  Administered 2021-05-24: 40 mg via INTRAVENOUS
  Administered 2021-05-24: 20 mg via INTRAVENOUS

## 2021-05-24 SURGICAL SUPPLY — 59 items
ADH SKN CLS APL DERMABOND .7 (GAUZE/BANDAGES/DRESSINGS) ×2
APL PRP STRL LF DISP 70% ISPRP (MISCELLANEOUS) ×2
BAG COUNTER SPONGE SURGICOUNT (BAG) ×4 IMPLANT
BAG SPNG CNTER NS LX DISP (BAG) ×2
BNDG GAUZE ELAST 4 BULKY (GAUZE/BANDAGES/DRESSINGS) ×4 IMPLANT
CANISTER SUCT 3000ML PPV (MISCELLANEOUS) ×4 IMPLANT
CHLORAPREP W/TINT 26 (MISCELLANEOUS) ×4 IMPLANT
CLIP VESOCCLUDE MED 24/CT (CLIP) ×4 IMPLANT
CLIP VESOCCLUDE SM WIDE 24/CT (CLIP) ×4 IMPLANT
CNTNR URN SCR LID CUP LEK RST (MISCELLANEOUS) ×9 IMPLANT
CONT SPEC 4OZ STRL OR WHT (MISCELLANEOUS) ×9
COVER MAYO STAND STRL (DRAPES) IMPLANT
COVER PROBE W GEL 5X96 (DRAPES) ×4 IMPLANT
COVER SURGICAL LIGHT HANDLE (MISCELLANEOUS) ×4 IMPLANT
DECANTER SPIKE VIAL GLASS SM (MISCELLANEOUS) ×4 IMPLANT
DERMABOND ADVANCED (GAUZE/BANDAGES/DRESSINGS) ×1
DERMABOND ADVANCED .7 DNX12 (GAUZE/BANDAGES/DRESSINGS) IMPLANT
DRAPE HALF SHEET 40X57 (DRAPES) ×4 IMPLANT
DRAPE LAPAROSCOPIC ABDOMINAL (DRAPES) ×4 IMPLANT
DRAPE LAPAROTOMY T 98X78 PEDS (DRAPES) ×1 IMPLANT
DRAPE UTILITY XL STRL (DRAPES) ×1 IMPLANT
DRSG TEGADERM 4X4.75 (GAUZE/BANDAGES/DRESSINGS) ×8 IMPLANT
ELECT REM PT RETURN 9FT ADLT (ELECTROSURGICAL) ×3
ELECTRODE REM PT RTRN 9FT ADLT (ELECTROSURGICAL) ×3 IMPLANT
GAUZE SPONGE 2X2 8PLY STRL LF (GAUZE/BANDAGES/DRESSINGS) ×3 IMPLANT
GAUZE SPONGE 4X4 12PLY STRL (GAUZE/BANDAGES/DRESSINGS) ×1 IMPLANT
GLOVE SURG ENC MOIS LTX SZ6 (GLOVE) ×4 IMPLANT
GLOVE SURG UNDER LTX SZ6.5 (GLOVE) ×4 IMPLANT
GOWN STRL REUS W/ TWL LRG LVL3 (GOWN DISPOSABLE) ×6 IMPLANT
GOWN STRL REUS W/TWL 2XL LVL3 (GOWN DISPOSABLE) ×8 IMPLANT
GOWN STRL REUS W/TWL LRG LVL3 (GOWN DISPOSABLE) ×6
KIT BASIN OR (CUSTOM PROCEDURE TRAY) ×4 IMPLANT
KIT TURNOVER KIT B (KITS) ×4 IMPLANT
MARKER SKIN DUAL TIP RULER LAB (MISCELLANEOUS) ×4 IMPLANT
NDL 18GX1X1/2 (RX/OR ONLY) (NEEDLE) ×3 IMPLANT
NDL FILTER BLUNT 18X1 1/2 (NEEDLE) IMPLANT
NDL HYPO 25GX1X1/2 BEV (NEEDLE) ×3 IMPLANT
NEEDLE 18GX1X1/2 (RX/OR ONLY) (NEEDLE) ×3 IMPLANT
NEEDLE FILTER BLUNT 18X 1/2SAF (NEEDLE)
NEEDLE FILTER BLUNT 18X1 1/2 (NEEDLE) IMPLANT
NEEDLE HYPO 25GX1X1/2 BEV (NEEDLE) ×6 IMPLANT
NS IRRIG 1000ML POUR BTL (IV SOLUTION) ×4 IMPLANT
PACK GENERAL/GYN (CUSTOM PROCEDURE TRAY) ×4 IMPLANT
PACK UNIVERSAL I (CUSTOM PROCEDURE TRAY) IMPLANT
PAD ARMBOARD 7.5X6 YLW CONV (MISCELLANEOUS) ×8 IMPLANT
SPECIMEN JAR MEDIUM (MISCELLANEOUS) ×4 IMPLANT
SPONGE GAUZE 2X2 STER 10/PKG (GAUZE/BANDAGES/DRESSINGS) ×1
SUT ETHILON 2 0 PSLX (SUTURE) ×4 IMPLANT
SUT MNCRL AB 4-0 PS2 18 (SUTURE) ×4 IMPLANT
SUT SILK 2 0 PERMA HAND 18 BK (SUTURE) ×4 IMPLANT
SUT VIC AB 2-0 SH 27 (SUTURE)
SUT VIC AB 2-0 SH 27XBRD (SUTURE) ×6 IMPLANT
SUT VIC AB 3-0 54X BRD REEL (SUTURE) IMPLANT
SUT VIC AB 3-0 BRD 54 (SUTURE) ×3
SUT VIC AB 3-0 SH 27 (SUTURE) ×9
SUT VIC AB 3-0 SH 27X BRD (SUTURE) ×6 IMPLANT
SYR CONTROL 10ML LL (SYRINGE) ×8 IMPLANT
TOWEL GREEN STERILE (TOWEL DISPOSABLE) ×4 IMPLANT
TOWEL GREEN STERILE FF (TOWEL DISPOSABLE) ×4 IMPLANT

## 2021-05-24 NOTE — Anesthesia Preprocedure Evaluation (Addendum)
Anesthesia Evaluation  ?Patient identified by MRN, date of birth, ID band ?Patient awake ? ? ? ?Reviewed: ?Allergy & Precautions, NPO status , Patient's Chart, lab work & pertinent test results ? ?History of Anesthesia Complications ?(+) PONV and history of anesthetic complications (states she gets sick with every anesthetic, has never has scop patch to her knowledge) ? ?Airway ?Mallampati: IV ? ?TM Distance: >3 FB ?Neck ROM: Full ? ?Mouth opening: Limited Mouth Opening ? Dental ? ?(+) Partial Upper,  ?  ?Pulmonary ?neg pulmonary ROS,  ?  ?Pulmonary exam normal ?breath sounds clear to auscultation ? ? ? ? ? ? Cardiovascular ?hypertension (195/78 in preop, per pt normally 120s-130s SBP. 179/72 in preop), Pt. on medications ?+ Peripheral Vascular Disease  ?Normal cardiovascular exam ?Rhythm:Regular Rate:Normal ? ? ?  ?Neuro/Psych ?negative neurological ROS ? negative psych ROS  ? GI/Hepatic ?negative GI ROS, Neg liver ROS,   ?Endo/Other  ?diabetes, Well Controlled, Type 2Hypothyroidism Obesity BMI 37 ?a1c 6.0 ? Renal/GU ?negative Renal ROS  ?negative genitourinary ?  ?Musculoskeletal ? ?(+) Arthritis , Osteoarthritis,   ? Abdominal ?(+) + obese,   ?Peds ? Hematology ?negative hematology ROS ?(+) hct 42.7    ?Anesthesia Other Findings ?Maxilla surgery 2013 after crush accident at work- some residual sinus issues, no mandibular involvement, no issues w/ opening mouth  ? ?Melanoma on back ? Reproductive/Obstetrics ?negative OB ROS ? ?  ? ? ? ? ? ? ? ? ? ? ? ? ? ?  ?  ? ? ? ? ? ? ?Anesthesia Physical ?Anesthesia Plan ? ?ASA: 3 ? ?Anesthesia Plan: General  ? ?Post-op Pain Management: Tylenol PO (pre-op)*  ? ?Induction: Intravenous ? ?PONV Risk Score and Plan: 4 or greater and Ondansetron, Dexamethasone, Scopolamine patch - Pre-op, Treatment may vary due to age or medical condition and Midazolam ? ?Airway Management Planned: Oral ETT and Video Laryngoscope Planned ? ?Additional Equipment:  None ? ?Intra-op Plan:  ? ?Post-operative Plan: Extubation in OR ? ?Informed Consent: I have reviewed the patients History and Physical, chart, labs and discussed the procedure including the risks, benefits and alternatives for the proposed anesthesia with the patient or authorized representative who has indicated his/her understanding and acceptance.  ? ? ? ?Dental advisory given ? ?Plan Discussed with: CRNA ? ?Anesthesia Plan Comments: (Small mouth opening, mall 4 but no upper teeth. Last intubation 2013 for maxilla surgery used glidescope: Grade View: Grade II ?Nasal Tubes: Nasal Dwyane Luo ?Number of attempts: 2 ?Airway Equipment and Method: Video-laryngoscopy)  ? ? ? ? ?Anesthesia Quick Evaluation ? ?

## 2021-05-24 NOTE — Anesthesia Procedure Notes (Addendum)
Procedure Name: Intubation ?Date/Time: 05/24/2021 1:51 PM ?Performed by: Michele Rockers, CRNA ?Pre-anesthesia Checklist: Patient identified, Patient being monitored, Timeout performed, Emergency Drugs available and Suction available ?Patient Re-evaluated:Patient Re-evaluated prior to induction ?Oxygen Delivery Method: Circle system utilized ?Preoxygenation: Pre-oxygenation with 100% oxygen ?Induction Type: IV induction and Rapid sequence ?Ventilation: Mask ventilation without difficulty and Oral airway inserted - appropriate to patient size ?Laryngoscope Size: Mac, 3 and Glidescope ?Grade View: Grade I ?Tube type: Oral ?Tube size: 7.0 mm ?Number of attempts: 1 ?Airway Equipment and Method: Stylet ?Placement Confirmation: ETT inserted through vocal cords under direct vision, positive ETCO2 and breath sounds checked- equal and bilateral ?Secured at: 22 cm ?Tube secured with: Tape ?Dental Injury: Teeth and Oropharynx as per pre-operative assessment  ?Difficulty Due To: Difficulty was anticipated, Difficult Airway- due to large tongue, Difficult Airway- due to reduced neck mobility and Difficult Airway- due to limited oral opening ?Comments: Recommend a 6.0 ETT for future. Took couple of tries before getting ETT tube in due to limited view once blade was in.  ? ? ? ? ?

## 2021-05-24 NOTE — Op Note (Addendum)
Operative Note ? ?Mckenzie Leonard  ?[id number]  [id number]  ?05/24/2021 ? ? ?Surgeon: Clovis Riley MD ?  ?Procedure performed: Wide local excision of left upper back melanoma, left axillary sentinel lymph node biopsy ?  ?Preop diagnosis: T3a melanoma ?Post-op diagnosis/intraop findings: Same ?  ?Specimens: Left upper back wide local excision (single short marks superior, double short marks lateral/left, single long marks deep), left axillary sentinel lymph nodes ?Retained items: no  ?EBL: Minimal cc ?Complications: none ?  ?Description of procedure: After obtaining informed consent the patient was taken to the operating room and placed supine on operating room table where general endotracheal anesthesia was initiated, preoperative antibiotics were administered, SCDs applied, and a formal timeout was performed.  The patient was then repositioned in the right lateral decubitus with all pressure points appropriately padded.  The left back and shoulder were prepped and draped in usual sterile fashion.  2 cc of methylene blue was infiltrated in the soft tissues and dermis surrounding the lesion.  A 2 cm margin was marked around the outer edge of the biopsy scar site circumferentially and then a elliptical incision measuring 5.5x15cm was created to encompass this along the Langer's lines.  Incision was created sharply and then cautery was used to dissect through the dermis and soft tissues down to the level of the fascia and muscle (approximately 4cm deep), at which level of the specimen was removed.   This was marked for orientation and handed off for pathology.  Hemostasis was ensured and the wound which was then closed with interrupted 3-0 Vicryl to reapproximate the deeper tissues and deep dermis followed by combination of running and interrupted horizontal mattress 2-0 nylon's to close the skin.  A dry dressing was then placed.  The patient was then repositioned supine and the left axilla prepped and draped usual  sterile fashion.  The neoprobe was used to identify the area of greatest signal.  An incision was made in the left axilla and the soft tissue dissected with cautery, using the neoprobe to guide further dissection.  There was 1 hot node in the fairly deep axilla, just lateral to the border of the pectoralis, this was excised.  There was a palpably enlarged node cephalad to this which was excised.  There were no blue nodes.  Hemostasis was ensured within the wound.  This was then closed with interrupted deep dermal 3-0 Vicryl and running subcuticular Monocryl.  Dermabond was applied to this incision.  The patient was then awakened, extubated and taken to PACU in stable condition.  ?  ?All counts were correct at the completion of the case.   ?

## 2021-05-24 NOTE — Transfer of Care (Signed)
Immediate Anesthesia Transfer of Care Note ? ?Patient: Mckenzie Leonard ? ?Procedure(s) Performed: MELANOMA WIDE LOCAL EXCISION (Left: Back) ?SENTINEL NODE BIOPSY (Left: Axilla) ? ?Patient Location: PACU ? ?Anesthesia Type:General ? ?Level of Consciousness: awake, alert  and oriented ? ?Airway & Oxygen Therapy: Patient Spontanous Breathing and Patient connected to nasal cannula oxygen ? ?Post-op Assessment: Report given to RN and Post -op Vital signs reviewed and stable ? ?Post vital signs: Reviewed and stable ? ?Last Vitals:  ?Vitals Value Taken Time  ?BP    ?Temp    ?Pulse 79 05/24/21 1556  ?Resp 18 05/24/21 1556  ?SpO2 100 % 05/24/21 1556  ?Vitals shown include unvalidated device data. ? ?Last Pain:  ?Vitals:  ? 05/24/21 1228  ?TempSrc:   ?PainSc: 0-No pain  ?   ? ?  ? ?Complications: No notable events documented. ?

## 2021-05-24 NOTE — Discharge Instructions (Signed)
GENERAL SURGERY: POST OP INSTRUCTIONS  EAT Gradually transition to a high fiber diet with a fiber supplement over the next few weeks after discharge.  Start with a pureed / full liquid diet (see below)  WALK Walk an hour a day (cumulative, not all at once).  Control your pain to do that.    CONTROL PAIN Control pain so that you can walk, sleep, tolerate sneezing/coughing, go up/down stairs.  HAVE A BOWEL MOVEMENT DAILY Keep your bowels regular to avoid problems.  OK to try a laxative to override constipation.  OK to use an antidairrheal to slow down diarrhea.  Call if not better after 2 tries  CALL IF YOU HAVE PROBLEMS/CONCERNS Call if you are still struggling despite following these instructions. Call if you have concerns not answered by these instructions    DIET: Follow a light bland diet & liquids the first 24 hours after arrival home, such as soup, liquids, starches, etc.  Be sure to drink plenty of fluids.  Quickly advance to a usual solid diet within a few days.  Avoid fast food or heavy meals as your are more likely to get nauseated or have irregular bowels.  A low-sugar, high-fiber diet for the rest of your life is ideal.   Take your usually prescribed home medications unless otherwise directed. PAIN CONTROL: Pain is best controlled by a usual combination of three different methods TOGETHER: Ice/Heat Over the counter pain medication Prescription pain medication Most patients will experience some swelling and bruising around the incisions.  Ice packs or heating pads (30-60 minutes up to 6 times a day) will help. Use ice for the first few days to help decrease swelling and bruising, then switch to heat to help relax tight/sore spots and speed recovery.  Some people prefer to use ice alone, heat alone, alternating between ice & heat.  Experiment to what works for you.  Swelling and bruising can take several weeks to resolve.   It is helpful to take an over-the-counter pain  medication regularly for the first few weeks.  Choose one of the following that works best for you: Naproxen (Aleve, etc)  Two 220mg tabs twice a day OR Ibuprofen (Advil, etc) Three 200mg tabs four times a day (every meal & bedtime) AND Acetaminophen (Tylenol, etc) 500-650mg four times a day (every meal & bedtime) A  prescription for pain medication (such as oxycodone, hydrocodone, etc) should be given to you upon discharge.  Take your pain medication as prescribed.  If you are having problems/concerns with the prescription medicine (does not control pain, nausea, vomiting, rash, itching, etc), please call us [phone] to see if we need to switch you to a different pain medicine that will work better for you and/or control your side effect better. If you need a refill on your pain medication, please contact your pharmacy.  They will contact our office to request authorization. Prescriptions will not be filled after 5 pm or on week-ends. Avoid getting constipated.  Between the surgery and the pain medications, it is common to experience some constipation.  Increasing fluid intake and taking a fiber supplement (such as Metamucil, Citrucel, FiberCon, MiraLax, etc) 1-2 times a day regularly will usually help prevent this problem from occurring.  A mild laxative (prune juice, Milk of Magnesia, MiraLax, etc) should be taken according to package directions if there are no bowel movements after 48 hours.   Wash / shower every day, starting 2 days after surgery.  You may shower over the   skin glue in the left armpit which is waterproof.  Let the soap and water run over the incisions and pat dry.  No rubbing, scrubbing, lotions or ointments to incisions.  Do not soak or swim. ?Remove your outer bandage 2 days after surgery; glue will flake off after 1 to 2 weeks.  You may leave the incision open to air.   You may replace a dressing/Band-Aid to cover the incision for comfort if you wish.  ? ?ACTIVITIES as tolerated:    ?You may resume regular (light) daily activities beginning the next day--such as daily self-care, walking, climbing stairs--gradually increasing activities as tolerated.  If you can walk 30 minutes without difficulty, it is safe to try more intense activity such as jogging, treadmill, bicycling, low-impact aerobics, swimming, etc. ?Save the most intensive and strenuous activity for last such as sit-ups, heavy lifting, contact sports, etc  Refrain from any heavy lifting or straining until you are off narcotics for pain control.   ?DO NOT PUSH THROUGH PAIN.  Let pain be your guide: If it hurts to do something, don't do it.  Pain is your body warning you to avoid that activity for another week until the pain goes down. ?You may drive when you are no longer taking prescription pain medication, you can comfortably wear a seatbelt, and you can safely maneuver your car and apply brakes. ?You may have sexual intercourse when it is comfortable.  ?FOLLOW UP in our office ?Please call [facility] at [phone] to set up an appointment to see your surgeon in the office for a follow-up appointment approximately 2-3 weeks after your surgery. ?Make sure that you call for this appointment the day you arrive home to insure a convenient appointment time. ?9. IF YOU HAVE DISABILITY OR FAMILY LEAVE FORMS, BRING THEM TO THE OFFICE FOR PROCESSING.  DO NOT GIVE THEM TO YOUR DOCTOR. ? ? ?WHEN TO CALL us [phone]: ?Poor pain control ?Reactions / problems with new medications (rash/itching, nausea, etc)  ?Fever over 101.5 F (38.5 C) ?Worsening swelling or bruising ?Continued bleeding from incision. ?Increased pain, redness, or drainage from the incision ?Difficulty breathing / swallowing ? ? The clinic staff is available to answer your questions during regular business hours (8:30am-5pm).  Please don?t hesitate to call and ask to speak to one of our nurses for clinical concerns.  ? If you have a medical emergency, go to the nearest  emergency room or call 911. ? A surgeon from [facility] is always on call at the hospitals ? ? ?[facility], [place] ?[address] [place], [place], [place]  [address] ? ?MAIN: [phone] ? TOLL FREE: [phone] ?  ?FAX [phone] ?[email or web address] ? ?

## 2021-05-24 NOTE — Interval H&P Note (Signed)
History and Physical Interval Note: ? ?05/24/2021 ?1:14 PM ? ?Mckenzie Leonard  has presented today for surgery, with the diagnosis of melanoma.  The various methods of treatment have been discussed with the patient and family. After consideration of risks, benefits and other options for treatment, the patient has consented to  Procedure(s): ?MELANOMA WIDE LOCAL EXCISION WITH SENTINEL NODE BIOPSY (N/A) as a surgical intervention.  The patient's history has been reviewed, patient examined, no change in status, stable for surgery.  I have reviewed the patient's chart and labs.  Questions were answered to the patient's satisfaction.   ? ? ?[name] ? ? ?

## 2021-05-25 ENCOUNTER — Encounter (HOSPITAL_COMMUNITY): Payer: Self-pay | Admitting: Surgery

## 2021-05-25 NOTE — Anesthesia Postprocedure Evaluation (Signed)
Anesthesia Post Note ? ?Patient: Mckenzie Leonard ? ?Procedure(s) Performed: MELANOMA WIDE LOCAL EXCISION (Left: Back) ?SENTINEL NODE BIOPSY (Left: Axilla) ? ?  ? ?Patient location during evaluation: PACU ?Anesthesia Type: General ?Level of consciousness: awake and alert ?Pain management: pain level controlled ?Vital Signs Assessment: post-procedure vital signs reviewed and stable ?Respiratory status: spontaneous breathing, nonlabored ventilation, respiratory function stable and patient connected to nasal cannula oxygen ?Cardiovascular status: blood pressure returned to baseline and stable ?Postop Assessment: no apparent nausea or vomiting ?Anesthetic complications: no ? ? ?No notable events documented. ? ?Last Vitals:  ?Vitals:  ? 05/24/21 1630 05/24/21 1645  ?BP: (!) 146/76 (!) 148/78  ?Pulse: 67 66  ?Resp: 12 13  ?Temp:    ?SpO2: 98% (!) 82%  ?  ?Last Pain:  ?Vitals:  ? 05/24/21 1615  ?TempSrc:   ?PainSc: 5   ? ? ?  ?  ?  ?  ?  ?  ? ?Arron Mcnaught S ? ? ? ? ?

## 2021-06-01 LAB — SURGICAL PATHOLOGY

## 2021-08-19 ENCOUNTER — Other Ambulatory Visit (HOSPITAL_COMMUNITY): Payer: Self-pay | Admitting: Nurse Practitioner

## 2021-08-19 DIAGNOSIS — Z1231 Encounter for screening mammogram for malignant neoplasm of breast: Secondary | ICD-10-CM

## 2021-08-24 IMAGING — MG DIGITAL SCREENING BILAT W/ TOMO W/ CAD
8 series · 8 of 24 positions shown · non-contrast
Comparison: Previous exam(s).

CLINICAL DATA: Screening.

EXAM:
DIGITAL SCREENING BILATERAL MAMMOGRAM WITH TOMO AND CAD

[R MLO synth-2D]
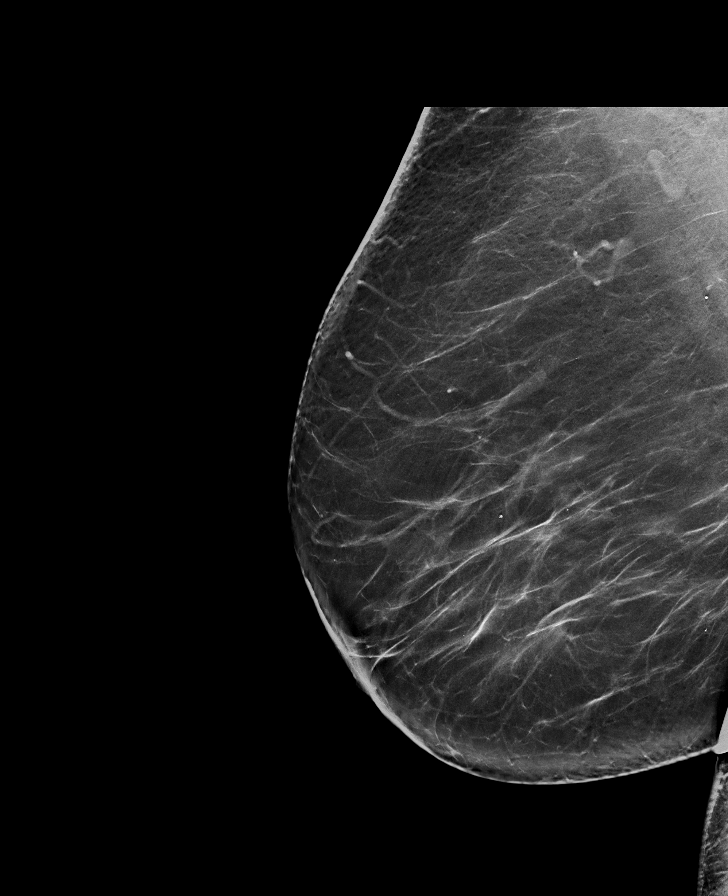

[R CC synth-2D]
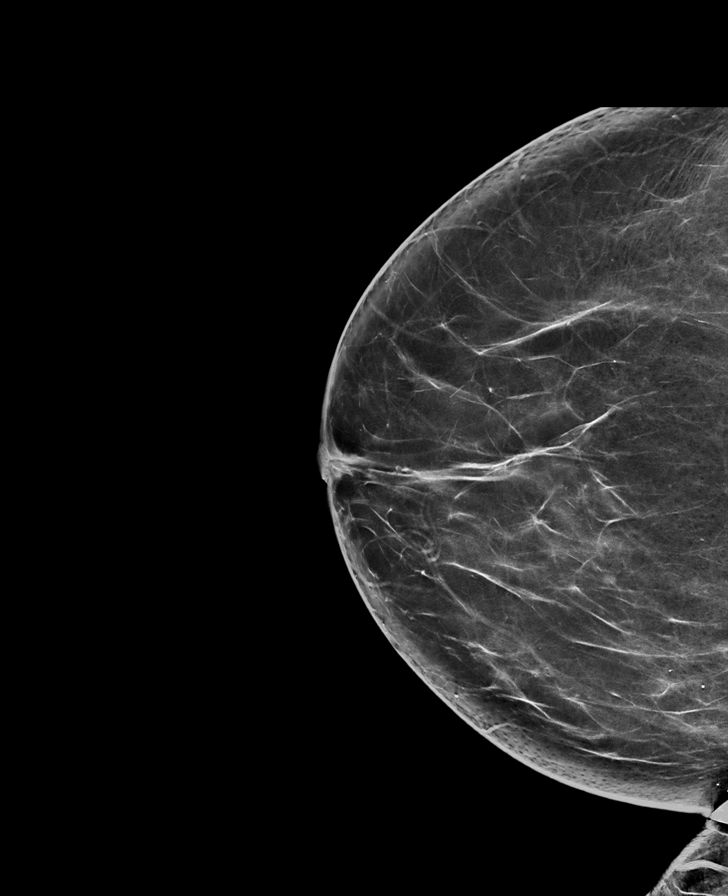

[L CC synth-2D]
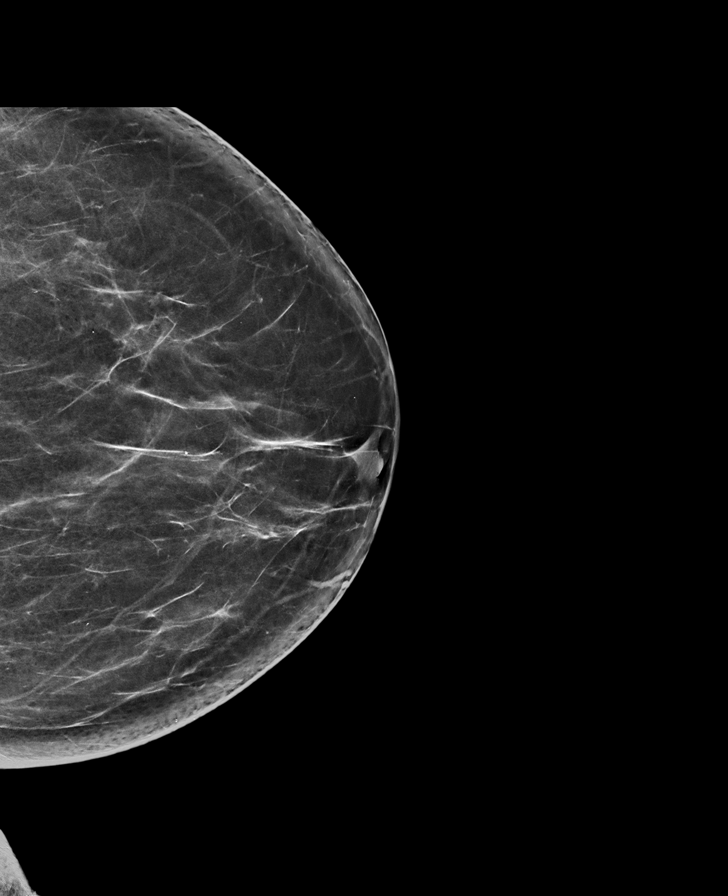

[L MLO synth-2D]
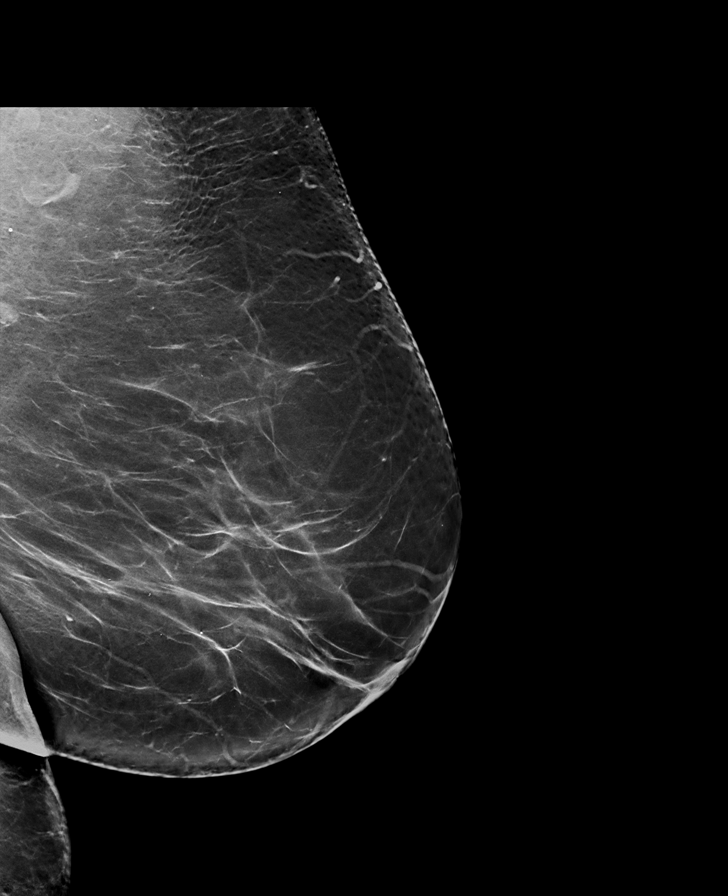

[R CC tomo · tomo slice 41/80.0]
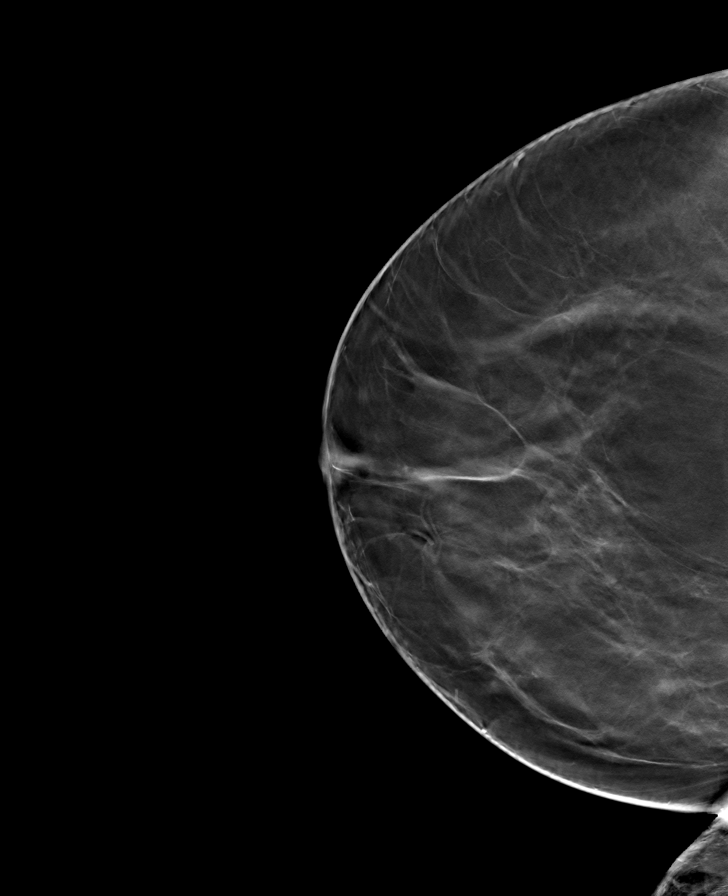

[L MLO tomo · tomo slice 47/92.0]
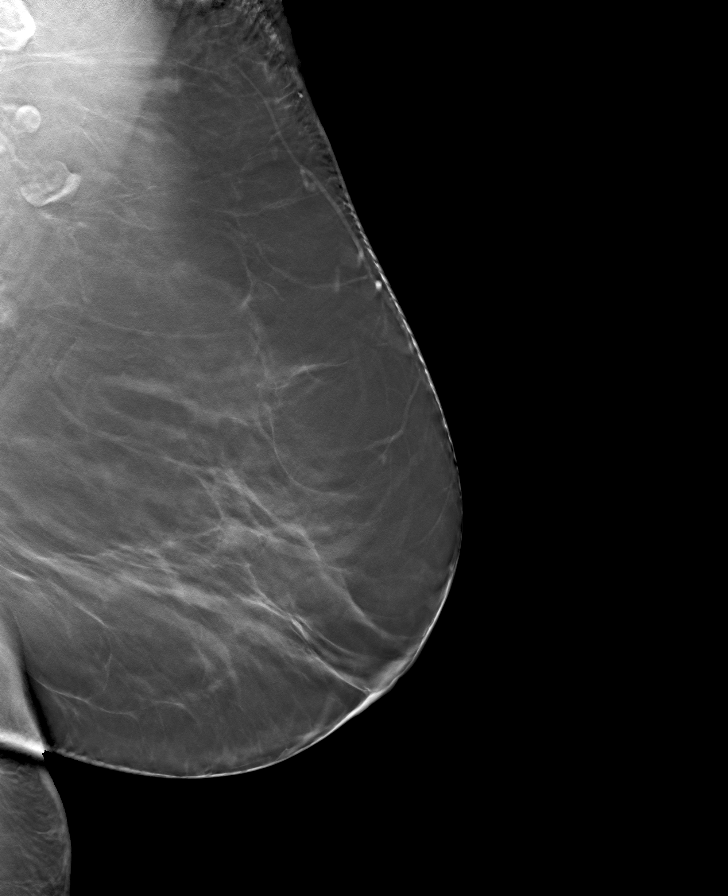

[R MLO tomo · tomo slice 46/91.0]
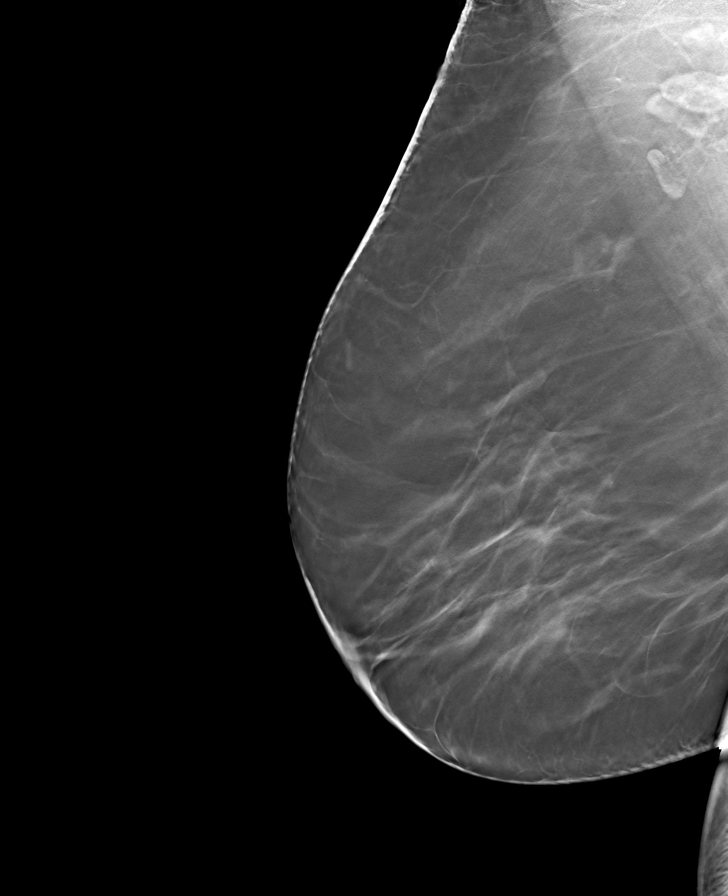

[L CC tomo · tomo slice 39/76.0]
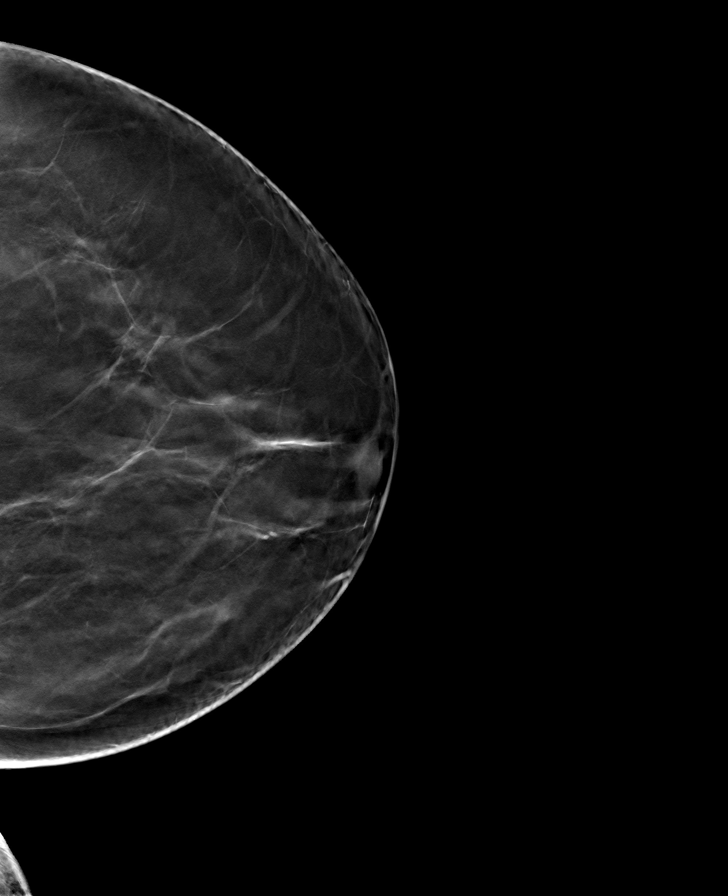

[8 of 24 positions shown; findings below may reference images not displayed]

ACR Breast Density Category b: There are scattered areas of
fibroglandular density.
FINDINGS: There are no findings suspicious for malignancy. Images were
processed with CAD.
IMPRESSION: No mammographic evidence of malignancy. A result letter of this
screening mammogram will be mailed directly to the patient.

RECOMMENDATION:
Screening mammogram in one year. (Code:[id number])

BI-RADS CATEGORY  1: Negative.

## 2021-09-02 DIAGNOSIS — Z08 Encounter for follow-up examination after completed treatment for malignant neoplasm: Secondary | ICD-10-CM | POA: Diagnosis not present

## 2021-09-02 DIAGNOSIS — X32XXXD Exposure to sunlight, subsequent encounter: Secondary | ICD-10-CM | POA: Diagnosis not present

## 2021-09-02 DIAGNOSIS — Z85820 Personal history of malignant melanoma of skin: Secondary | ICD-10-CM | POA: Diagnosis not present

## 2021-09-02 DIAGNOSIS — L820 Inflamed seborrheic keratosis: Secondary | ICD-10-CM | POA: Diagnosis not present

## 2021-09-02 DIAGNOSIS — D044 Carcinoma in situ of skin of scalp and neck: Secondary | ICD-10-CM | POA: Diagnosis not present

## 2021-09-02 DIAGNOSIS — I872 Venous insufficiency (chronic) (peripheral): Secondary | ICD-10-CM | POA: Diagnosis not present

## 2021-09-02 DIAGNOSIS — Z1283 Encounter for screening for malignant neoplasm of skin: Secondary | ICD-10-CM | POA: Diagnosis not present

## 2021-09-02 DIAGNOSIS — L570 Actinic keratosis: Secondary | ICD-10-CM | POA: Diagnosis not present

## 2021-09-06 ENCOUNTER — Ambulatory Visit (HOSPITAL_COMMUNITY)
Admission: RE | Admit: 2021-09-06 | Discharge: 2021-09-06 | Disposition: A | Discharge status: Home / Self Care | Payer: Medicare Other | Source: Ambulatory Visit | Attending: Nurse Practitioner | Admitting: Nurse Practitioner

## 2021-09-06 DIAGNOSIS — Z1231 Encounter for screening mammogram for malignant neoplasm of breast: Secondary | ICD-10-CM | POA: Diagnosis not present

## 2021-09-24 ENCOUNTER — Encounter: Payer: Self-pay | Admitting: Nurse Practitioner

## 2021-09-24 ENCOUNTER — Ambulatory Visit (INDEPENDENT_AMBULATORY_CARE_PROVIDER_SITE_OTHER): Payer: Medicare Other | Admitting: Nurse Practitioner

## 2021-09-24 VITALS — BP 152/78 | HR 63 | Temp 97.6°F | Resp 20 | Ht 62.0 in | Wt 204.0 lb

## 2021-09-24 DIAGNOSIS — R609 Edema, unspecified: Secondary | ICD-10-CM

## 2021-09-24 DIAGNOSIS — E039 Hypothyroidism, unspecified: Secondary | ICD-10-CM

## 2021-09-24 DIAGNOSIS — E876 Hypokalemia: Secondary | ICD-10-CM | POA: Diagnosis not present

## 2021-09-24 DIAGNOSIS — I739 Peripheral vascular disease, unspecified: Secondary | ICD-10-CM | POA: Diagnosis not present

## 2021-09-24 DIAGNOSIS — E559 Vitamin D deficiency, unspecified: Secondary | ICD-10-CM | POA: Diagnosis not present

## 2021-09-24 DIAGNOSIS — Z6832 Body mass index (BMI) 32.0-32.9, adult: Secondary | ICD-10-CM

## 2021-09-24 DIAGNOSIS — I10 Essential (primary) hypertension: Secondary | ICD-10-CM

## 2021-09-24 DIAGNOSIS — E785 Hyperlipidemia, unspecified: Secondary | ICD-10-CM | POA: Diagnosis not present

## 2021-09-24 DIAGNOSIS — E119 Type 2 diabetes mellitus without complications: Secondary | ICD-10-CM | POA: Diagnosis not present

## 2021-09-24 DIAGNOSIS — M8588 Other specified disorders of bone density and structure, other site: Secondary | ICD-10-CM

## 2021-09-24 LAB — BAYER DCA HB A1C WAIVED: HB A1C (BAYER DCA - WAIVED): 6 % — ABNORMAL HIGH (ref 4.8–5.6)

## 2021-09-24 MED ORDER — ATORVASTATIN CALCIUM 40 MG PO TABS: 40.0000 mg | ORAL_TABLET | Freq: Every day | ORAL | 1 refills | Status: DC

## 2021-09-24 MED ORDER — OLMESARTAN MEDOXOMIL-HCTZ 40-25 MG PO TABS: 1.0000 | ORAL_TABLET | Freq: Every day | ORAL | 1 refills | Status: DC

## 2021-09-24 MED ORDER — LEVOTHYROXINE SODIUM 50 MCG PO TABS: 50.0000 ug | ORAL_TABLET | Freq: Every day | ORAL | 1 refills | Status: DC

## 2021-09-24 NOTE — Patient Instructions (Signed)

## 2021-09-24 NOTE — Progress Notes (Signed)
Subjective:    Patient ID: Mckenzie Leonard, female    DOB: [birth year], 71 y.o.   MRN: [id number]   Chief Complaint: medical management of chronic issues     HPI:  Mckenzie Leonard is a 71 y.o. who identifies as a female who was assigned female at birth.   Social history: Lives with: herself Work history: retired   Scientist, forensic in today for follow up of the following chronic medical issues:  1. Hyperlipidemia, unspecified hyperlipidemia type Does try to watch diet. Stays very active but does no dedicated exercise. Lab Results  Component Value Date   CHOL 117 03/29/2021   HDL 40 03/29/2021   LDLCALC 53 03/29/2021   TRIG 140 03/29/2021   CHOLHDL 2.9 03/29/2021     2. Controlled type 2 diabetes mellitus without complication, without long-term current use of insulin (HCC) Fasting blood sugars are running 120+-140. No low blood sugars. Lab Results  Component Value Date   HGBA1C 6.0 (H) 03/29/2021     3. Hypothyroidism (acquired) No problems that she is aware of  4. PAD (peripheral artery disease) (HCC) Has poor circulation oin lower ext. Feet get cold very easily.  5. Hypokalemia No c/o of muscle cramping Lab Results  Component Value Date   K 3.9 05/14/2021     6. Peripheral edema Has daily by the end of day. Resolves some at night. Is not currently on a fluid pill.  7. Osteopenia of lumbar spine Last dexascan was done 12/24/19 t score -2.1. she does no dedicated weight bearing exercise.  8. Vitamin D deficiency Is on vitamin d supplement daily  9. Hypertension No c/o chest pain, sob or headache. Blood pressure at home has been running high. 378'H systolic. BP Readings from Last 3 Encounters:  09/24/21 (!) 152/78  05/24/21 (!) 148/78  05/14/21 (!) 160/79    10. BMI 32.0-32.9,adult No recent weight changes Wt Readings from Last 3 Encounters:  05/24/21 200 lb (90.7 kg)  05/14/21 203 lb 14.4 oz (92.5 kg)  03/29/21 201 lb (91.2 kg)   BMI Readings from Last 3  Encounters:  05/24/21 36.58 kg/m  05/14/21 37.29 kg/m  03/29/21 35.61 kg/m      New complaints: None today  Allergies  Allergen Reactions   Sulfa Antibiotics Anaphylaxis   Statins Other (See Comments)    One of them caused muscle pain   Tetanus Toxoids     Reports flu like symptoms and swelling at injection site only   Outpatient Encounter Medications as of 09/24/2021  Medication Sig   ALFALFA PO Take 1 capsule by mouth daily.   Ascorbic Acid (VITAMIN C) 100 MG tablet Take 100 mg by mouth daily.   aspirin EC 81 MG tablet Take 81 mg by mouth daily. Swallow whole.   atorvastatin (LIPITOR) 40 MG tablet Take 1 tablet (40 mg total) by mouth daily.   BIOTIN PO Take 1 tablet by mouth daily.   Blood Glucose Monitoring Suppl (BLOOD GLUCOSE METER KIT AND SUPPLIES) KIT Dispense based on patient and insurance preference. Use to check BG once daily.  ICD 10 code = E11.9   calcium-vitamin D (OSCAL WITH D) 500-200 MG-UNIT tablet Take 1 tablet by mouth 2 (two) times daily.   Cholecalciferol (VITAMIN D) 2000 units CAPS Take 6,000 Units by mouth daily.    glucose blood (ONETOUCH ULTRA) test strip CHECK BLOOD SUGAR DAILY AS DIRECTED Dx E11.9   levothyroxine (SYNTHROID) 50 MCG tablet Take 1 tablet (50 mcg total) by mouth daily.  magnesium gluconate (MAGONATE) 500 MG tablet Take 500 mg by mouth daily.   methocarbamol (ROBAXIN) 500 MG tablet Take 1 tablet (500 mg total) by mouth every 6 (six) hours as needed for muscle spasms. Muscle spasms or pain   Misc Natural Products (JOINT HEALTH PO) Take 1 tablet by mouth daily.   Multiple Vitamin (MULTIVITAMIN WITH MINERALS) TABS tablet Take 1 tablet by mouth daily.   Multiple Vitamins-Minerals (ZINC PO) Take 1 tablet by mouth daily.   naproxen sodium (ALEVE) 220 MG tablet Take 220 mg by mouth daily.   olmesartan (BENICAR) 40 MG tablet Take 1 tablet (40 mg total) by mouth daily.   oxymetazoline (AFRIN) 0.05 % nasal spray Place 1 spray into both nostrils 2  (two) times daily as needed for congestion.   No facility-administered encounter medications on file as of 09/24/2021.    Past Surgical History:  Procedure Laterality Date   BREAST BIOPSY Right    negative   CARPAL TUNNEL RELEASE     Right   CLOSED REDUCTION MANDIBLE  02/20/2012   Procedure: CLOSED REDUCTION MANDIBULAR;  Surgeon: Isac Caddy, DDS;  Location: [facility];  Service: Oral Surgery;  Laterality: Right;  Closed reduction zygonach arch fx   EXCISION MELANOMA WITH SENTINEL LYMPH NODE BIOPSY Left 05/24/2021   Procedure: MELANOMA WIDE LOCAL EXCISION;  Surgeon: Clovis Riley, MD;  Location: [facility];  Service: General;  Laterality: Left;   JOINT REPLACEMENT     KNEE CARTILAGE SURGERY Left    ORIF FACIAL FRACTURE  02/20/2012   Procedure: OPEN REDUCTION INTERNAL FIXATION (ORIF) MULTIPLE FACIAL FRACTURES;  Surgeon: Isac Caddy, DDS;  Location: [facility];  Service: Oral Surgery;  Laterality: Right;  ORIF Rt ZMC (zygomaxillary)   SENTINEL NODE BIOPSY Left 05/24/2021   Procedure: SENTINEL NODE BIOPSY;  Surgeon: Clovis Riley, MD;  Location: [facility];  Service: General;  Laterality: Left;   TOTAL KNEE ARTHROPLASTY Left 10/12/2016   Procedure: TOTAL KNEE ARTHROPLASTY;  Surgeon: Carole Civil, MD;  Location: [facility];  Service: Orthopedics;  Laterality: Left;    Family History  Problem Relation Age of Onset   Arthritis Mother    Heart disease Mother        enlarged heart   Anemia Mother    Stroke Mother    Hearing loss Mother    Arthritis Father    Diabetes Father    Hypertension Father    Cancer Sister        breast   Diabetes Brother    CAD Brother    Healthy Daughter    Healthy Daughter       Controlled substance contract: n/a     Review of Systems  Constitutional:  Negative for diaphoresis.  Eyes:  Negative for pain.  Respiratory:  Negative for shortness of breath.   Cardiovascular:  Negative for chest pain, palpitations and leg swelling.   Gastrointestinal:  Negative for abdominal pain.  Endocrine: Negative for polydipsia.  Musculoskeletal:  Positive for arthralgias.  Skin:  Negative for rash.  Neurological:  Negative for dizziness, weakness and headaches.  Hematological:  Does not bruise/bleed easily.  All other systems reviewed and are negative.      Objective:   Physical Exam Vitals and nursing note reviewed.  Constitutional:      General: She is not in acute distress.    Appearance: Normal appearance. She is well-developed.  HENT:     Head: Normocephalic.     Right Ear: Tympanic membrane normal.  Left Ear: Tympanic membrane normal.     Nose: Nose normal.     Mouth/Throat:     Mouth: Mucous membranes are moist.  Eyes:     Pupils: Pupils are equal, round, and reactive to light.  Neck:     Vascular: No carotid bruit or JVD.  Cardiovascular:     Rate and Rhythm: Normal rate and regular rhythm.     Heart sounds: Normal heart sounds.  Pulmonary:     Effort: Pulmonary effort is normal. No respiratory distress.     Breath sounds: Normal breath sounds. No wheezing or rales.  Chest:     Chest wall: No tenderness.  Abdominal:     General: Bowel sounds are normal. There is no distension or abdominal bruit.     Palpations: Abdomen is soft. There is no hepatomegaly, splenomegaly, mass or pulsatile mass.     Tenderness: There is no abdominal tenderness.  Musculoskeletal:        General: Normal range of motion.     Cervical back: Normal range of motion and neck supple.     Right lower leg: Edema (2+) present.     Left lower leg: Edema (2+) present.  Lymphadenopathy:     Cervical: No cervical adenopathy.  Skin:    General: Skin is warm and dry.  Neurological:     Mental Status: She is alert and oriented to person, place, and time.     Deep Tendon Reflexes: Reflexes are normal and symmetric.  Psychiatric:        Behavior: Behavior normal.        Thought Content: Thought content normal.        Judgment:  Judgment normal.    BP (!) 152/78   Pulse 63   Temp 97.6 F (36.4 C) (Temporal)   Resp 20   Ht 5' 2" (1.575 m)   Wt 204 lb (92.5 kg)   SpO2 97%   BMI 37.31 kg/m   HGBA1c 6.0%       Assessment & Plan:  LELIANA KONTZ comes in today with chief complaint of Medical Management of Chronic Issues   Diagnosis and orders addressed:  1. Primary hypertension Low sodium diet' Changed benicar to benicar HCTZ daioly - olmesartan-hydrochlorothiazide (BENICAR HCT) 40-25 MG tablet; Take 1 tablet by mouth daily.  Dispense: 90 tablet; Refill: 1  2. Hyperlipidemia, unspecified hyperlipidemia type Low fat diet - CBC with Differential/Platelet - CMP14+EGFR - Lipid panel - atorvastatin (LIPITOR) 40 MG tablet; Take 1 tablet (40 mg total) by mouth daily.  Dispense: 90 tablet; Refill: 1  3. Controlled type 2 diabetes mellitus without complication, without long-term current use of insulin (HCC) Continue to watch carbs in diet - Bayer DCA Hb A1c Waived - Microalbumin / creatinine urine ratio  4. Hypothyroidism (acquired) Labs pending - Thyroid Panel With TSH - levothyroxine (SYNTHROID) 50 MCG tablet; Take 1 tablet (50 mcg total) by mouth daily.  Dispense: 90 tablet; Refill: 1  5. PAD (peripheral artery disease) (HCC) Elevate legs when sitting  6. Hypokalemia Labs pending  7. Peripheral edema Elevate legs when sitting  8. Osteopenia of lumbar spine Weight bearing exercises  9. Vitamin D deficiency Continue daily vitamin d supplement  10. BMI 32.0-32.9,adult Discussed diet and exercise for person with BMI >25 Will recheck weight in 3-6 months    Labs pending Health Maintenance reviewed Diet and exercise encouraged  Follow up plan: 6 months   Mary-Margaret Hassell Done, FNP Low sodium diet

## 2021-09-25 LAB — CBC WITH DIFFERENTIAL/PLATELET
Basophils Absolute: 0 10*3/uL (ref 0.0–0.2)
Basos: 1 %
EOS (ABSOLUTE): 0.2 10*3/uL (ref 0.0–0.4)
Eos: 3 %
Hematocrit: 39.7 % (ref 34.0–46.6)
Hemoglobin: 12.9 g/dL (ref 11.1–15.9)
Immature Grans (Abs): 0 10*3/uL (ref 0.0–0.1)
Immature Granulocytes: 0 %
Lymphocytes Absolute: 1.4 10*3/uL (ref 0.7–3.1)
Lymphs: 23 %
MCH: 29 pg (ref 26.6–33.0)
MCHC: 32.5 g/dL (ref 31.5–35.7)
MCV: 89 fL (ref 79–97)
Monocytes Absolute: 0.4 10*3/uL (ref 0.1–0.9)
Monocytes: 7 %
Neutrophils Absolute: 4.3 10*3/uL (ref 1.4–7.0)
Neutrophils: 66 %
Platelets: 246 10*3/uL (ref 150–450)
RBC: 4.45 x10E6/uL (ref 3.77–5.28)
RDW: 14.1 % (ref 11.7–15.4)
WBC: 6.4 10*3/uL (ref 3.4–10.8)

## 2021-09-25 LAB — CMP14+EGFR
ALT: 22 IU/L (ref 0–32)
AST: 24 IU/L (ref 0–40)
Albumin/Globulin Ratio: 1.9 (ref 1.2–2.2)
Albumin: 4.5 g/dL (ref 3.8–4.8)
Alkaline Phosphatase: 82 IU/L (ref 44–121)
BUN/Creatinine Ratio: 16 (ref 12–28)
BUN: 11 mg/dL (ref 8–27)
Bilirubin Total: 1.2 mg/dL (ref 0.0–1.2)
CO2: 22 mmol/L (ref 20–29)
Calcium: 9.3 mg/dL (ref 8.7–10.3)
Chloride: 101 mmol/L (ref 96–106)
Creatinine, Ser: 0.7 mg/dL (ref 0.57–1.00)
Globulin, Total: 2.4 g/dL (ref 1.5–4.5)
Glucose: 111 mg/dL — ABNORMAL HIGH (ref 70–99)
Potassium: 4.2 mmol/L (ref 3.5–5.2)
Sodium: 141 mmol/L (ref 134–144)
Total Protein: 6.9 g/dL (ref 6.0–8.5)
eGFR: 92 mL/min/{1.73_m2} (ref >59)

## 2021-09-25 LAB — LIPID PANEL
Chol/HDL Ratio: 2.7 ratio (ref 0.0–4.4)
Cholesterol, Total: 120 mg/dL (ref 100–199)
HDL: 45 mg/dL (ref >39)
LDL Chol Calc (NIH): 52 mg/dL (ref 0–99)
Triglycerides: 134 mg/dL (ref 0–149)
VLDL Cholesterol Cal: 23 mg/dL (ref 5–40)

## 2021-09-25 LAB — MICROALBUMIN / CREATININE URINE RATIO
Creatinine, Urine: 34.1 mg/dL
Microalb/Creat Ratio: 10 mg/g creat (ref 0–29)
Microalbumin, Urine: 3.5 ug/mL

## 2021-09-25 LAB — THYROID PANEL WITH TSH
Free Thyroxine Index: 1.7 (ref 1.2–4.9)
T3 Uptake Ratio: 25 % (ref 24–39)
T4, Total: 6.6 ug/dL (ref 4.5–12.0)
TSH: 1.48 u[IU]/mL (ref 0.450–4.500)

## 2021-10-05 ENCOUNTER — Ambulatory Visit (INDEPENDENT_AMBULATORY_CARE_PROVIDER_SITE_OTHER): Payer: Medicare Other

## 2021-10-05 VITALS — BP 127/71 | Wt 202.0 lb

## 2021-10-05 DIAGNOSIS — Z0000 Encounter for general adult medical examination without abnormal findings: Secondary | ICD-10-CM

## 2021-10-05 NOTE — Progress Notes (Signed)
Subjective:   Mckenzie Leonard is a 71 y.o. female who presents for Medicare Annual (Subsequent) preventive examination.  Virtual Visit via Telephone Note  I connected with  Mckenzie Leonard on 10/05/21 at  9:45 AM EDT by telephone and verified that I am speaking with the correct person using two identifiers.  Location: Patient: Home Provider: [facility] Persons participating in the virtual visit: patient/Nurse Health Advisor   I discussed the limitations, risks, security and privacy concerns of performing an evaluation and management service by telephone and the availability of in person appointments. The patient expressed understanding and agreed to proceed.  Interactive audio and video telecommunications were attempted between this nurse and patient, however failed, due to patient having technical difficulties OR patient did not have access to video capability.  We continued and completed visit with audio only.  Some vital signs may be absent or patient reported.   Mckenzie Leonard E Mckenzie Tully, LPN   Review of Systems     Cardiac Risk Factors include: advanced age (>83mn, >>35women);diabetes mellitus;dyslipidemia;hypertension;obesity (BMI >30kg/m2)     Objective:    Today's Vitals   10/05/21 0950  BP: 127/71  Weight: 202 lb (91.6 kg)   Body mass index is 36.95 kg/m.     10/05/2021    9:57 AM 05/14/2021   10:50 AM 10/02/2020   10:12 AM 10/02/2019    9:44 AM 09/03/2018    9:58 AM 08/30/2017    8:55 AM 11/01/2016   11:44 AM  Advanced Directives  Does Patient Have a Medical Advance Directive? _0  No No  Would patient like information on creating a medical advance directive? No - Patient declined No - Patient declined No - Patient declined No - Patient declined No - Patient declined No - Patient declined     Current Medications (verified) Outpatient Encounter Medications as of 10/05/2021  Medication Sig   ALFALFA PO Take 1 capsule by mouth daily.   Ascorbic Acid (VITAMIN C) 100 MG tablet  Take 100 mg by mouth daily.   aspirin EC 81 MG tablet Take 81 mg by mouth daily. Swallow whole.   atorvastatin (LIPITOR) 40 MG tablet Take 1 tablet (40 mg total) by mouth daily.   BIOTIN PO Take 1 tablet by mouth daily.   Blood Glucose Monitoring Suppl (BLOOD GLUCOSE METER KIT AND SUPPLIES) KIT Dispense based on patient and insurance preference. Use to check BG once daily.  ICD 10 code = E11.9   calcium-vitamin D (OSCAL WITH D) 500-200 MG-UNIT tablet Take 1 tablet by mouth 2 (two) times daily.   Cholecalciferol (VITAMIN D) 2000 units CAPS Take 6,000 Units by mouth daily.    glucose blood (ONETOUCH ULTRA) test strip CHECK BLOOD SUGAR DAILY AS DIRECTED Dx E11.9   levothyroxine (SYNTHROID) 50 MCG tablet Take 1 tablet (50 mcg total) by mouth daily.   magnesium gluconate (MAGONATE) 500 MG tablet Take 500 mg by mouth daily.   Misc Natural Products (JOINT HEALTH PO) Take 1 tablet by mouth daily.   Multiple Vitamin (MULTIVITAMIN WITH MINERALS) TABS tablet Take 1 tablet by mouth daily.   naproxen sodium (ALEVE) 220 MG tablet Take 220 mg by mouth daily.   olmesartan-hydrochlorothiazide (BENICAR HCT) 40-25 MG tablet Take 1 tablet by mouth daily.   [DISCONTINUED] Multiple Vitamins-Minerals (ZINC PO) Take 1 tablet by mouth daily.   No facility-administered encounter medications on file as of 10/05/2021.    Allergies (verified) Sulfa antibiotics, Statins, and Tetanus toxoids   History: Past  Medical History:  Diagnosis Date   Arthritis    Diabetes mellitus without complication (HCC)    High cholesterol    Hypertension    Hypothyroidism    PONV (postoperative nausea and vomiting)    Thyroid disease    Past Surgical History:  Procedure Laterality Date   BREAST BIOPSY Right    negative   CARPAL TUNNEL RELEASE     Right   CLOSED REDUCTION MANDIBLE  02/20/2012   Procedure: CLOSED REDUCTION MANDIBULAR;  Surgeon: Isac Caddy, DDS;  Location: [place];  Service: Oral Surgery;  Laterality: Right;   Closed reduction zygonach arch fx   EXCISION MELANOMA WITH SENTINEL LYMPH NODE BIOPSY Left 05/24/2021   Procedure: MELANOMA WIDE LOCAL EXCISION;  Surgeon: Clovis Riley, MD;  Location: [place];  Service: General;  Laterality: Left;   JOINT REPLACEMENT     KNEE CARTILAGE SURGERY Left    ORIF FACIAL FRACTURE  02/20/2012   Procedure: OPEN REDUCTION INTERNAL FIXATION (ORIF) MULTIPLE FACIAL FRACTURES;  Surgeon: Isac Caddy, DDS;  Location: [place];  Service: Oral Surgery;  Laterality: Right;  ORIF Rt Z[facility] (zygomaxillary)   SENTINEL NODE BIOPSY Left 05/24/2021   Procedure: SENTINEL NODE BIOPSY;  Surgeon: Clovis Riley, MD;  Location: [place];  Service: General;  Laterality: Left;   TOTAL KNEE ARTHROPLASTY Left 10/12/2016   Procedure: TOTAL KNEE ARTHROPLASTY;  Surgeon: Carole Civil, MD;  Location: [facility];  Service: Orthopedics;  Laterality: Left;   Family History  Problem Relation Age of Onset   Arthritis Mother    Heart disease Mother        enlarged heart   Anemia Mother    Stroke Mother    Hearing loss Mother    Arthritis Father    Diabetes Father    Hypertension Father    Cancer Sister        breast   Diabetes Brother    CAD Brother    Healthy Daughter    Healthy Daughter    Social History   Socioeconomic History   Marital status: Divorced    Spouse name: Not on file   Number of children: 2   Years of education: 10   Highest education level: GED or equivalent  Occupational History   Occupation: retired    Comment: Charity fundraiser  Tobacco Use   Smoking status: Never   Smokeless tobacco: Never  Vaping Use   Vaping Use: Never used  Substance and Sexual Activity   Alcohol use: No   Drug use: No   Sexual activity: Yes    Birth control/protection: Post-menopausal  Other Topics Concern   Not on file  Social History Narrative   Spends lots of time with her daughters and sister, goes to church and senior center frequently; stays on the go   Social Determinants of  Health   Financial Resource Strain: Low Risk  (10/05/2021)   Overall Financial Resource Strain (CARDIA)    Difficulty of Paying Living Expenses: Not hard at all  Food Insecurity: No Food Insecurity (10/05/2021)   Hunger Vital Sign    Worried About Running Out of Food in the Last Year: Never true    [place] in the Last Year: Never true  Transportation Needs: No Transportation Needs (10/05/2021)   PRAPARE - Hydrologist (Medical): No    Lack of Transportation (Non-Medical): No  Physical Activity: Sufficiently Active (10/05/2021)   Exercise Vital Sign    Days of  Exercise per Week: 4 days    Minutes of Exercise per Session: 40 min  Stress: No Stress Concern Present (10/05/2021)   [place]    Feeling of Stress : Not at all  Social Connections: Moderately Integrated (10/05/2021)   Social Connection and Isolation Panel [NHANES]    Frequency of Communication with Friends and Family: More than three times a week    Frequency of Social Gatherings with Friends and Family: More than three times a week    Attends Religious Services: More than 4 times per year    Active Member of [employer] or Organizations: Yes    Attends Music therapist: More than 4 times per year    Marital Status: Divorced    Tobacco Counseling Counseling given: Not Answered   Clinical Intake:  Pre-visit preparation completed: Yes  Pain : No/denies pain     BMI - recorded: 36.95 Nutritional Status: BMI > 30  Obese Nutritional Risks: None Diabetes: Yes CBG done?: No Did pt. bring in CBG monitor from home?: No  How often do you need to have someone help you when you read instructions, pamphlets, or other written materials from your doctor or pharmacy?: 1 - Never  Diabetic? Nutrition Risk Assessment:  Has the patient had any N/V/D within the last 2 months?  No  Does the patient have any non-healing wounds?  No   Has the patient had any unintentional weight loss or weight gain?  No   Diabetes:  Is the patient diabetic?  Yes  If diabetic, was a CBG obtained today?  No  Did the patient bring in their glucometer from home?  No  How often do you monitor your CBG's? Once daily fasting - 137 this am per patient.   Financial Strains and Diabetes Management:  Are you having any financial strains with the device, your supplies or your medication? No .  Does the patient want to be seen by Chronic Care Management for management of their diabetes?  No  Would the patient like to be referred to a Nutritionist or for Diabetic Management?  No   Diabetic Exams:  Diabetic Eye Exam: Completed 04/2021 - we need record.   Diabetic Foot Exam: Completed 03/29/2021. Pt has been advised about the importance in completing this exam.   Interpreter Needed?: No  Information entered by :: Nyeem Stoke, LPN   Activities of Daily Living    10/05/2021    9:55 AM 05/14/2021   10:52 AM  In your present state of health, do you have any difficulty performing the following activities:  Hearing? 0   Vision? 0   Difficulty concentrating or making decisions? 0   Walking or climbing stairs? 0   Dressing or bathing? 0   Doing errands, shopping? 0 0  Preparing Food and eating ? N   Using the Toilet? N   In the past six months, have you accidently leaked urine? N   Do you have problems with loss of bowel control? N   Managing your Medications? N   Managing your Finances? N   Housekeeping or managing your Housekeeping? N     Patient Care Team: [name], FNP as PCP - General (Nurse Practitioner) Carole Civil, MD as Consulting Physician (Orthopedic Surgery) Celestia Khat, [place] (Optometry)  Indicate any recent Medical Services you may have received from other than [facility] providers in the past year (date may be approximate).     Assessment:  This is a routine wellness examination for  [place].  Hearing/Vision screen Hearing Screening - Comments:: Denies hearing difficulties   Vision Screening - Comments:: Wears rx glasses - up to date with routine eye exams with [facility]  Dietary issues and exercise activities discussed: Current Exercise Habits: Home exercise routine, Type of exercise: walking, Time (Minutes): 45, Frequency (Times/Week): 4, Weekly Exercise (Minutes/Week): 180, Intensity: Mild, Exercise limited by: orthopedic condition(s)   Goals Addressed             This Visit's Progress    Exercise 150 minutes per week (moderate activity)   On track    Walk for 30 minutes daily 8/8/20223 - hopes to stay as active and healthy as she already is       Depression Screen    10/05/2021    9:54 AM 09/24/2021   10:02 AM 03/29/2021   11:07 AM 10/02/2020    9:58 AM 09/24/2020   10:35 AM 03/25/2020   10:57 AM 12/24/2019   11:00 AM  PHQ 2/9 Scores  PHQ - 2 Score 0 0 0 0 0 0 0  PHQ- 9 Score 0 0 0 0 0      Fall Risk    10/05/2021    9:51 AM 09/24/2021   10:02 AM 03/29/2021   11:07 AM 10/02/2020   10:12 AM 09/24/2020   10:35 AM  Fall Risk   Falls in the past year? 0 0 0 0 0  Number falls in past yr: 0      Injury with Fall? 0   0   Risk for fall due to : Orthopedic patient   Orthopedic patient   Follow up Falls prevention discussed   Falls prevention discussed     [place]:  Any stairs in or around the home? No  If so, are there any without handrails? No  Home free of loose throw rugs in walkways, pet beds, electrical cords, etc? Yes  Adequate lighting in your home to reduce risk of falls? Yes   ASSISTIVE DEVICES UTILIZED TO PREVENT FALLS:  Life alert? No  Use of a cane, walker or w/c? No  Grab bars in the bathroom? No  Shower chair or bench in shower? No  Elevated toilet seat or a handicapped toilet? No   TIMED UP AND GO:  Was the test performed? No . Telephonic visit  Cognitive Function:    08/30/2017    9:02  AM 08/29/2016    3:02 PM  MMSE - Mini Mental State Exam  Orientation to time 5 5  Orientation to Place 5 5  Registration 3 3  Attention/ Calculation 5 5  Recall 3 3  Language- name 2 objects 2 2  Language- repeat 1 1  Language- follow 3 step command 3 3  Language- read & follow direction 1 1  Write a sentence 1 1  Copy design 1 1  Total score 30 30        10/05/2021    9:55 AM 10/02/2019    9:45 AM 09/03/2018   10:00 AM  6CIT Screen  What Year? 0 points 0 points 0 points  What month? 0 points 0 points 0 points  What time? 0 points 0 points 0 points  Count back from 20 0 points 0 points 0 points  Months in reverse 0 points 0 points 0 points  Repeat phrase 0 points 0 points 0 points  Total Score 0 points 0 points 0 points  Immunizations Immunization History  Administered Date(s) Administered   Tdap 02/18/2012    TDAP status: Up to date  Flu Vaccine status: Declined, Education has been provided regarding the importance of this vaccine but patient still declined. Advised may receive this vaccine at local pharmacy or Health Dept. Aware to provide a copy of the vaccination record if obtained from local pharmacy or Health Dept. Verbalized acceptance and understanding.  Pneumococcal vaccine status: Declined,  Education has been provided regarding the importance of this vaccine but patient still declined. Advised may receive this vaccine at local pharmacy or Health Dept. Aware to provide a copy of the vaccination record if obtained from local pharmacy or Health Dept. Verbalized acceptance and understanding.   Covid-19 vaccine status: Declined, Education has been provided regarding the importance of this vaccine but patient still declined. Advised may receive this vaccine at local pharmacy or Health Dept.or vaccine clinic. Aware to provide a copy of the vaccination record if obtained from local pharmacy or Health Dept. Verbalized acceptance and understanding.  Qualifies for Shingles  Vaccine? Yes   Zostavax completed No   Shingrix Completed?: No.    Education has been provided regarding the importance of this vaccine. Patient has been advised to call insurance company to determine out of pocket expense if they have not yet received this vaccine. Advised may also receive vaccine at local pharmacy or Health Dept. Verbalized acceptance and understanding.  Screening Tests Health Maintenance  Topic Date Due   COLON CANCER SCREENING ANNUAL FOBT  08/04/2020   OPHTHALMOLOGY EXAM  05/05/2021   INFLUENZA VACCINE  09/28/2021   COVID-19 Vaccine (1) 10/10/2021 (Originally 05/09/1951)   Zoster Vaccines- Shingrix (1 of 2) 12/25/2021 (Originally 11/10/1969)   Pneumonia Vaccine 44+ Years old (1 - PCV) 03/29/2022 (Originally 11/09/2013)   DEXA SCAN  12/24/2021   TETANUS/TDAP  02/17/2022   HEMOGLOBIN A1C  03/27/2022   FOOT EXAM  03/29/2022   MAMMOGRAM  09/07/2023   Hepatitis C Screening  Completed   HPV VACCINES  Aged Out   COLONOSCOPY (Pts 45-87yr Insurance coverage will need to be confirmed)  Discontinued    Health Maintenance  Health Maintenance Due  Topic Date Due   COLON CANCER SCREENING ANNUAL FOBT  08/04/2020   OPHTHALMOLOGY EXAM  05/05/2021   INFLUENZA VACCINE  09/28/2021    Colorectal cancer screening: Type of screening: FOBT/FIT. Completed 2021. Repeat every 1 years patient may consider, but declines further Colonoscopy testing  Mammogram status: Completed 09/06/2021. Repeat every year  Bone Density status: Completed 12/25/2019. Results reflect: Bone density results: OSTEOPENIA. Repeat every 2 years.  Lung Cancer Screening: (Low Dose CT Chest recommended if Age 73-80years, 30 pack-year currently smoking OR have quit w/in 15years.) does not qualify.   Additional Screening:  Hepatitis C Screening: does qualify; Completed 05/22/2015  Vision Screening: Recommended annual ophthalmology exams for early detection of glaucoma and other disorders of the eye. Is the  patient up to date with their annual eye exam?  Yes  Who is the provider or what is the name of the office in which the patient attends annual [facility] exams[place] [facility] is not established with a provider, would they like to be referred to a provider to establish care? No .   Dental Screening: Recommended annual dental exams for proper oral hygiene  Community Resource Referral / Chronic Care Management: CRR required this visit?  No   CCM required this visit?  No      Plan:  I have personally reviewed and noted the following in the patient's chart:   Medical and social history Use of alcohol, tobacco or illicit drugs  Current medications and supplements including opioid prescriptions.  Functional ability and status Nutritional status Physical activity Advanced directives List of other physicians Hospitalizations, surgeries, and ER visits in previous 12 months Vitals Screenings to include cognitive, depression, and falls Referrals and appointments  In addition, I have reviewed and discussed with patient certain preventive protocols, quality metrics, and best practice recommendations. A written personalized care plan for preventive services as well as general preventive health recommendations were provided to patient.     Sandrea Hammond, LPN   11/07/8719   Nurse Notes: Patient declines Colon Cancer screening and all vaccines at this time.

## 2021-10-05 NOTE — Patient Instructions (Signed)
Mckenzie Leonard , Thank you for taking time to come for your Medicare Wellness Visit. I appreciate your ongoing commitment to your health goals. Please review the following plan we discussed and let me know if I can assist you in the future.   Screening recommendations/referrals: Colonoscopy: Done 2009 - declines repeat - last FOBT 2021 - suggest repeat annually Mammogram: Done 09/06/2021 - Repeat annually  Bone Density: Done 11/30/2019 - Repeat every 2 years  Recommended yearly ophthalmology/optometry visit for glaucoma screening and checkup Recommended yearly dental visit for hygiene and checkup  Vaccinations: declines all Influenza vaccine: recommend every Fall Pneumococcal vaccine: recommend once per lifetime Prevnar-20 Tdap vaccine: Done 02/18/2012 - recommend every 10 years Shingles vaccine: recommend Shingrix which is 2 doses 2-6 months apart and over 90% effective     Covid-19: recommend 2 doses one month apart with a booster 6 months later   Advanced directives: Advance directive discussed with you today. Even though you declined this today, please call our office should you change your mind, and we can give you the proper paperwork for you to fill out.   Conditions/risks identified: Aim for 30 minutes of exercise or brisk walking, 6-8 glasses of water, and 5 servings of fruits and vegetables each day.   Next appointment: Follow up in one year for your annual wellness visit    Preventive Care 65 Years and Older, Female Preventive care refers to lifestyle choices and visits with your health care provider that can promote health and wellness. What does preventive care include? A yearly physical exam. This is also called an annual well check. Dental exams once or twice a year. Routine eye exams. Ask your health care provider how often you should have your eyes checked. Personal lifestyle choices, including: Daily care of your teeth and gums. Regular physical activity. Eating a healthy  diet. Avoiding tobacco and drug use. Limiting alcohol use. Practicing safe sex. Taking low-dose aspirin every day. Taking vitamin and mineral supplements as recommended by your health care provider. What happens during an annual well check? The services and screenings done by your health care provider during your annual well check will depend on your age, overall health, lifestyle risk factors, and family history of disease. Counseling  Your health care provider may ask you questions about your: Alcohol use. Tobacco use. Drug use. Emotional well-being. Home and relationship well-being. Sexual activity. Eating habits. History of falls. Memory and ability to understand (cognition). Work and work Statistician. Reproductive health. Screening  You may have the following tests or measurements: Height, weight, and BMI. Blood pressure. Lipid and cholesterol levels. These may be checked every 5 years, or more frequently if you are over 43 years old. Skin check. Lung cancer screening. You may have this screening every year starting at age 45 if you have a 30-pack-year history of smoking and currently smoke or have quit within the past 15 years. Fecal occult blood test (FOBT) of the stool. You may have this test every year starting at age 73. Flexible sigmoidoscopy or colonoscopy. You may have a sigmoidoscopy every 5 years or a colonoscopy every 10 years starting at age 19. Hepatitis C blood test. Hepatitis B blood test. Sexually transmitted disease (STD) testing. Diabetes screening. This is done by checking your blood sugar (glucose) after you have not eaten for a while (fasting). You may have this done every 1-3 years. Bone density scan. This is done to screen for osteoporosis. You may have this done starting at age 24. Mammogram.  This may be done every 1-2 years. Talk to your health care provider about how often you should have regular mammograms. Talk with your health care provider about  your test results, treatment options, and if necessary, the need for more tests. Vaccines  Your health care provider may recommend certain vaccines, such as: Influenza vaccine. This is recommended every year. Tetanus, diphtheria, and acellular pertussis (Tdap, Td) vaccine. You may need a Td booster every 10 years. Zoster vaccine. You may need this after age 57. Pneumococcal 13-valent conjugate (PCV13) vaccine. One dose is recommended after age 60. Pneumococcal polysaccharide (PPSV23) vaccine. One dose is recommended after age 87. Talk to your health care provider about which screenings and vaccines you need and how often you need them. This information is not intended to replace advice given to you by your health care provider. Make sure you discuss any questions you have with your health care provider. Document Released: 03/13/2015 Document Revised: 11/04/2015 Document Reviewed: 12/16/2014 Elsevier Interactive Patient Education  2017 [place] Prevention in the Home Falls can cause injuries. They can happen to people of all ages. There are many things you can do to make your home safe and to help prevent falls. What can I do on the outside of my home? Regularly fix the edges of walkways and driveways and fix any cracks. Remove anything that might make you trip as you walk through a door, such as a raised step or threshold. Trim any bushes or trees on the path to your home. Use bright outdoor lighting. Clear any walking paths of anything that might make someone trip, such as rocks or tools. Regularly check to see if handrails are loose or broken. Make sure that both sides of any steps have handrails. Any raised decks and porches should have guardrails on the edges. Have any leaves, snow, or ice cleared regularly. Use sand or salt on walking paths during winter. Clean up any spills in your garage right away. This includes oil or grease spills. What can I do in the bathroom? Use  night lights. Install grab bars by the toilet and in the tub and shower. Do not use towel bars as grab bars. Use non-skid mats or decals in the tub or shower. If you need to sit down in the shower, use a plastic, non-slip stool. Keep the floor dry. Clean up any water that spills on the floor as soon as it happens. Remove soap buildup in the tub or shower regularly. Attach bath mats securely with double-sided non-slip rug tape. Do not have throw rugs and other things on the floor that can make you trip. What can I do in the bedroom? Use night lights. Make sure that you have a light by your bed that is easy to reach. Do not use any sheets or blankets that are too big for your bed. They should not hang down onto the floor. Have a firm chair that has side arms. You can use this for support while you get dressed. Do not have throw rugs and other things on the floor that can make you trip. What can I do in the kitchen? Clean up any spills right away. Avoid walking on wet floors. Keep items that you use a lot in easy-to-reach places. If you need to reach something above you, use a strong step stool that has a grab bar. Keep electrical cords out of the way. Do not use floor polish or wax that makes floors slippery. If you  must use wax, use non-skid floor wax. Do not have throw rugs and other things on the floor that can make you trip. What can I do with my stairs? Do not leave any items on the stairs. Make sure that there are handrails on both sides of the stairs and use them. Fix handrails that are broken or loose. Make sure that handrails are as long as the stairways. Check any carpeting to make sure that it is firmly attached to the stairs. Fix any carpet that is loose or worn. Avoid having throw rugs at the top or bottom of the stairs. If you do have throw rugs, attach them to the floor with carpet tape. Make sure that you have a light switch at the top of the stairs and the bottom of the  stairs. If you do not have them, ask someone to add them for you. What else can I do to help prevent falls? Wear shoes that: Do not have high heels. Have rubber bottoms. Are comfortable and fit you well. Are closed at the toe. Do not wear sandals. If you use a stepladder: Make sure that it is fully opened. Do not climb a closed stepladder. Make sure that both sides of the stepladder are locked into place. Ask someone to hold it for you, if possible. Clearly mark and make sure that you can see: Any grab bars or handrails. First and last steps. Where the edge of each step is. Use tools that help you move around (mobility aids) if they are needed. These include: Canes. Walkers. Scooters. Crutches. Turn on the lights when you go into a dark area. Replace any light bulbs as soon as they burn out. Set up your furniture so you have a clear path. Avoid moving your furniture around. If any of your floors are uneven, fix them. If there are any pets around you, be aware of where they are. Review your medicines with your doctor. Some medicines can make you feel dizzy. This can increase your chance of falling. Ask your doctor what other things that you can do to help prevent falls. This information is not intended to replace advice given to you by your health care provider. Make sure you discuss any questions you have with your health care provider. Document Released: 12/11/2008 Document Revised: 07/23/2015 Document Reviewed: 03/21/2014 Elsevier Interactive Patient Education  2017 [employer].

## 2021-10-07 DIAGNOSIS — Z85820 Personal history of malignant melanoma of skin: Secondary | ICD-10-CM | POA: Diagnosis not present

## 2021-10-07 DIAGNOSIS — Z85828 Personal history of other malignant neoplasm of skin: Secondary | ICD-10-CM | POA: Diagnosis not present

## 2021-10-07 DIAGNOSIS — Z08 Encounter for follow-up examination after completed treatment for malignant neoplasm: Secondary | ICD-10-CM | POA: Diagnosis not present

## 2021-11-22 ENCOUNTER — Other Ambulatory Visit: Payer: Self-pay | Admitting: Nurse Practitioner

## 2021-12-02 DIAGNOSIS — X32XXXD Exposure to sunlight, subsequent encounter: Secondary | ICD-10-CM | POA: Diagnosis not present

## 2021-12-02 DIAGNOSIS — Z85820 Personal history of malignant melanoma of skin: Secondary | ICD-10-CM | POA: Diagnosis not present

## 2021-12-02 DIAGNOSIS — D225 Melanocytic nevi of trunk: Secondary | ICD-10-CM | POA: Diagnosis not present

## 2021-12-02 DIAGNOSIS — Z1283 Encounter for screening for malignant neoplasm of skin: Secondary | ICD-10-CM | POA: Diagnosis not present

## 2021-12-02 DIAGNOSIS — L570 Actinic keratosis: Secondary | ICD-10-CM | POA: Diagnosis not present

## 2021-12-02 DIAGNOSIS — Z08 Encounter for follow-up examination after completed treatment for malignant neoplasm: Secondary | ICD-10-CM | POA: Diagnosis not present

## 2022-03-10 DIAGNOSIS — Z85820 Personal history of malignant melanoma of skin: Secondary | ICD-10-CM | POA: Diagnosis not present

## 2022-03-10 DIAGNOSIS — D225 Melanocytic nevi of trunk: Secondary | ICD-10-CM | POA: Diagnosis not present

## 2022-03-10 DIAGNOSIS — L570 Actinic keratosis: Secondary | ICD-10-CM | POA: Diagnosis not present

## 2022-03-10 DIAGNOSIS — D485 Neoplasm of uncertain behavior of skin: Secondary | ICD-10-CM | POA: Diagnosis not present

## 2022-03-10 DIAGNOSIS — X32XXXD Exposure to sunlight, subsequent encounter: Secondary | ICD-10-CM | POA: Diagnosis not present

## 2022-03-10 DIAGNOSIS — Z08 Encounter for follow-up examination after completed treatment for malignant neoplasm: Secondary | ICD-10-CM | POA: Diagnosis not present

## 2022-03-10 DIAGNOSIS — Z1283 Encounter for screening for malignant neoplasm of skin: Secondary | ICD-10-CM | POA: Diagnosis not present

## 2022-03-21 ENCOUNTER — Other Ambulatory Visit: Payer: Self-pay | Admitting: Nurse Practitioner

## 2022-03-21 DIAGNOSIS — I10 Essential (primary) hypertension: Secondary | ICD-10-CM

## 2022-03-28 ENCOUNTER — Ambulatory Visit (INDEPENDENT_AMBULATORY_CARE_PROVIDER_SITE_OTHER): Payer: 59

## 2022-03-28 ENCOUNTER — Encounter: Payer: Self-pay | Admitting: Nurse Practitioner

## 2022-03-28 ENCOUNTER — Ambulatory Visit (INDEPENDENT_AMBULATORY_CARE_PROVIDER_SITE_OTHER): Payer: 59 | Admitting: Nurse Practitioner

## 2022-03-28 VITALS — BP 131/73 | HR 71 | Temp 97.1°F | Resp 20 | Ht 62.0 in | Wt 202.0 lb

## 2022-03-28 DIAGNOSIS — M8588 Other specified disorders of bone density and structure, other site: Secondary | ICD-10-CM

## 2022-03-28 DIAGNOSIS — I10 Essential (primary) hypertension: Secondary | ICD-10-CM

## 2022-03-28 DIAGNOSIS — E785 Hyperlipidemia, unspecified: Secondary | ICD-10-CM

## 2022-03-28 DIAGNOSIS — E559 Vitamin D deficiency, unspecified: Secondary | ICD-10-CM | POA: Diagnosis not present

## 2022-03-28 DIAGNOSIS — E119 Type 2 diabetes mellitus without complications: Secondary | ICD-10-CM | POA: Diagnosis not present

## 2022-03-28 DIAGNOSIS — R609 Edema, unspecified: Secondary | ICD-10-CM

## 2022-03-28 DIAGNOSIS — I739 Peripheral vascular disease, unspecified: Secondary | ICD-10-CM | POA: Diagnosis not present

## 2022-03-28 DIAGNOSIS — E876 Hypokalemia: Secondary | ICD-10-CM

## 2022-03-28 DIAGNOSIS — M85852 Other specified disorders of bone density and structure, left thigh: Secondary | ICD-10-CM | POA: Diagnosis not present

## 2022-03-28 DIAGNOSIS — M85851 Other specified disorders of bone density and structure, right thigh: Secondary | ICD-10-CM | POA: Diagnosis not present

## 2022-03-28 DIAGNOSIS — Z6832 Body mass index (BMI) 32.0-32.9, adult: Secondary | ICD-10-CM

## 2022-03-28 DIAGNOSIS — E039 Hypothyroidism, unspecified: Secondary | ICD-10-CM

## 2022-03-28 LAB — BAYER DCA HB A1C WAIVED: HB A1C (BAYER DCA - WAIVED): 6.3 % — ABNORMAL HIGH (ref 4.8–5.6)

## 2022-03-28 MED ORDER — ATORVASTATIN CALCIUM 40 MG PO TABS: 40.0000 mg | ORAL_TABLET | Freq: Every day | ORAL | 1 refills | Status: DC

## 2022-03-28 MED ORDER — OLMESARTAN MEDOXOMIL-HCTZ 40-25 MG PO TABS: 1.0000 | ORAL_TABLET | Freq: Every day | ORAL | 1 refills | Status: DC

## 2022-03-28 MED ORDER — LEVOTHYROXINE SODIUM 50 MCG PO TABS: 50.0000 ug | ORAL_TABLET | Freq: Every day | ORAL | 1 refills | Status: DC

## 2022-03-28 NOTE — Patient Instructions (Signed)
Bone Health Bones protect organs, store calcium, anchor muscles, and support the whole body. Keeping your bones strong is important, especially as you get older. You can take actions to help keep your bones strong and healthy. Why is keeping my bones healthy important?  Keeping your bones healthy is important because your body constantly replaces bone cells. Cells get old, and new cells take their place. As we age, we lose bone cells because the body may not be able to make enough new cells to replace the old cells. The amount of bone cells and bone tissue you have is referred to as bone mass. The higher your bone mass, the stronger your bones. The aging process leads to an overall loss of bone mass in the body, which can increase the likelihood of: Broken bones. A condition in which the bones become weak and brittle (osteoporosis). A large decline in bone mass occurs in older adults. In women, it occurs about the time of menopause. What actions can I take to keep my bones healthy? Good health habits are important for maintaining healthy bones. This includes eating nutritious foods and exercising regularly. To have healthy bones, you need to get enough of the right minerals and vitamins. Most nutrition experts recommend getting these nutrients from the foods that you eat. In some cases, taking supplements may also be recommended. Doing certain types of exercise is also important for bone health. What are the nutritional recommendations for healthy bones?  Eating a well-balanced diet with plenty of calcium and vitamin D will help to protect your bones. Nutritional recommendations vary from person to person. Ask your health care provider what is healthy for you. Here are some general guidelines. Get enough calcium Calcium is the most important (essential) mineral for bone health. Most people can get enough calcium from their diet, but supplements may be recommended for people who are at risk for  osteoporosis. Good sources of calcium include: Dairy products, such as low-fat or nonfat milk, cheese, and yogurt. Dark green leafy vegetables, such as bok choy and broccoli. Foods that have calcium added to them (are fortified). Foods that may be fortified with calcium include orange juice, cereal, bread, soy beverages, and tofu products. Nuts, such as almonds. Follow these recommended amounts for daily calcium intake: Infants, 0-6 months: 200 mg. Infants, 6-12 months: 260 mg. Children, age 1-3: 700 mg. Children, age 4-8: 1,000 mg. Children, age 9-13: 1,300 mg. Teens, age 14-18: 1,300 mg. Adults, age 19-50: 1,000 mg. Adults, age 51-70: Men: 1,000 mg. Women: 1,200 mg. Adults, age 71 or older: 1,200 mg. Pregnant and breastfeeding females: Teens: 1,300 mg. Adults: 1,000 mg. Get enough vitamin D Vitamin D is the most essential vitamin for bone health. It helps the body absorb calcium. Sunlight stimulates the skin to make vitamin D, so be sure to get enough sunlight. If you live in a cold climate or you do not get outside often, your health care provider may recommend that you take vitamin D supplements. Good sources of vitamin D in your diet include: Egg yolks. Saltwater fish. Milk and cereal fortified with vitamin D. Follow these recommended amounts for daily vitamin D intake: Infants, 0-12 months: 400 international units (IU). Children and teens, age 1-18: 600 international units. Adults, age 59 or younger: 600 international units. Adults, age 60 or older: 600-1,000 international units. Get other important nutrients Other nutrients that are important for bone health include: Phosphorus. This mineral is found in meat, poultry, dairy foods, nuts, and legumes. The   recommended daily intake for adult men and adult women is 700 mg. Magnesium. This mineral is found in seeds, nuts, dark green vegetables, and legumes. The recommended daily intake for adult men is 400-420 mg. For adult women,  it is 310-320 mg. Vitamin K. This vitamin is found in green leafy vegetables. The recommended daily intake is 120 mcg for adult men and 90 mcg for adult women. What type of physical activity is best for building and maintaining healthy bones? Weight-bearing and strength-building activities are important for building and maintaining healthy bones. Weight-bearing activities cause muscles and bones to work against gravity. Strength-building activities increase the strength of the muscles that support bones. Weight-bearing and muscle-building activities include: Walking and hiking. Jogging and running. Dancing. Gym exercises. Lifting weights. Tennis and racquetball. Climbing stairs. Aerobics. Adults should get at least 30 minutes of moderate physical activity on most days. Children should get at least 60 minutes of moderate physical activity on most days. Ask your health care provider what type of exercise is best for you. How can I find out if my bone mass is low? Bone mass can be measured with an X-ray test called a bone mineral density (BMD) test. This test is recommended for all women who are age 65 or older. It may also be recommended for: Men who are age 70 or older. People who are at risk for osteoporosis because of: Having a long-term disease that weakens bones, such as kidney disease or rheumatoid arthritis. Having menopause earlier than normal. Taking medicine that weakens bones, such as steroids, thyroid hormones, or hormone treatment for breast cancer or prostate cancer. Smoking. Drinking three or more alcoholic drinks a day. Being underweight. Sedentary lifestyle. If you find that you have a low bone mass, you may be able to prevent osteoporosis or further bone loss by changing your diet and lifestyle. Where can I find more information? Bone Health & Osteoporosis Foundation: www.nof.org/patients National Institutes of Health: www.bones.nih.gov International Osteoporosis  Foundation: www.iofbonehealth.org Summary The aging process leads to an overall loss of bone mass in the body, which can increase the likelihood of broken bones and osteoporosis. Eating a well-balanced diet with plenty of calcium and vitamin D will help to protect your bones. Weight-bearing and strength-building activities are also important for building and maintaining strong bones. Bone mass can be measured with an X-ray test called a bone mineral density (BMD) test. This information is not intended to replace advice given to you by your health care provider. Make sure you discuss any questions you have with your health care provider. Document Revised: 07/29/2020 Document Reviewed: 07/29/2020 Elsevier Patient Education  2023 [employer]  

## 2022-03-28 NOTE — Progress Notes (Addendum)
Subjective:    Patient ID: Mckenzie Leonard, female    DOB: Feb 15, 1951, 72 y.o.   MRN: [id number]   Chief Complaint: medical management of chronic issues     HPI:  Mckenzie Leonard is a 72 y.o. who identifies as a female who was assigned female at birth.   Social history: Lives with: by herself- family checks on her often Work history: retired   Scientist, forensic in today for follow up of the following chronic medical issues:  1. Primary hypertension No c.o chest pain, sob or headache. Doe snot check blood pressure at home. BP Readings from Last 3 Encounters:  10/05/21 127/71  09/24/21 (!) 152/78  05/24/21 (!) 148/78     2. Hyperlipidemia, unspecified hyperlipidemia type Does not really wathc diet and does no dedicated exercise. Lab Results  Component Value Date   CHOL 120 09/24/2021   HDL 45 09/24/2021   LDLCALC 52 09/24/2021   TRIG 134 09/24/2021   CHOLHDL 2.7 09/24/2021     3. Hypothyroidism (acquired) No problems that she is aware of. Lab Results  Component Value Date   TSH 1.480 09/24/2021     4. Controlled type 2 diabetes mellitus without complication, without long-term current use of insulin (HCC) Fasting blood sugars are running around 130-160. No low blood sugars  5. PAD (peripheral artery disease) (HCC) Lower ext. Get cold at times with mild edema.  6. Hypokalemia No cramping Lab Results  Component Value Date   K 4.2 09/24/2021     7. Peripheral edema Slight daily  8. Osteopenia of lumbar spine Last dexascan was done on 12/25/19. Her  score was -1.4.  9. Vitamin D deficiency Is on daily vitamin d supplement  10. BMI 32.0-32.9,adult No recent weight changes Wt Readings from Last 3 Encounters:  03/28/22 202 lb (91.6 kg)  10/05/21 202 lb (91.6 kg)  09/24/21 204 lb (92.5 kg)   BMI Readings from Last 3 Encounters:  03/28/22 36.95 kg/m  10/05/21 36.95 kg/m  09/24/21 37.31 kg/m      New complaints: None today  Allergies  Allergen  Reactions   Sulfa Antibiotics Anaphylaxis   Statins Other (See Comments)    One of them caused muscle pain   Tetanus Toxoids     Reports flu like symptoms and swelling at injection site only   Outpatient Encounter Medications as of 03/28/2022  Medication Sig   ALFALFA PO Take 1 capsule by mouth daily.   Ascorbic Acid (VITAMIN C) 100 MG tablet Take 100 mg by mouth daily.   aspirin EC 81 MG tablet Take 81 mg by mouth daily. Swallow whole.   atorvastatin (LIPITOR) 40 MG tablet Take 1 tablet (40 mg total) by mouth daily.   BIOTIN PO Take 1 tablet by mouth daily.   Blood Glucose Monitoring Suppl (BLOOD GLUCOSE METER KIT AND SUPPLIES) KIT Dispense based on patient and insurance preference. Use to check BG once daily.  ICD 10 code = E11.9   calcium-vitamin D (OSCAL WITH D) 500-200 MG-UNIT tablet Take 1 tablet by mouth 2 (two) times daily.   Cholecalciferol (VITAMIN D) 2000 units CAPS Take 6,000 Units by mouth daily.    glucose blood (ONETOUCH ULTRA) test strip CHECK BLOOD SUGAR DAILY AS DIRECTED   levothyroxine (SYNTHROID) 50 MCG tablet Take 1 tablet (50 mcg total) by mouth daily.   magnesium gluconate (MAGONATE) 500 MG tablet Take 500 mg by mouth daily.   Misc Natural Products (JOINT HEALTH PO) Take 1 tablet by mouth daily.  Multiple Vitamin (MULTIVITAMIN WITH MINERALS) TABS tablet Take 1 tablet by mouth daily.   naproxen sodium (ALEVE) 220 MG tablet Take 220 mg by mouth daily.   olmesartan-hydrochlorothiazide (BENICAR HCT) 40-25 MG tablet TAKE ONE TABLET ONCE DAILY   No facility-administered encounter medications on file as of 03/28/2022.    Past Surgical History:  Procedure Laterality Date   BREAST BIOPSY Right    negative   CARPAL TUNNEL RELEASE     Right   CLOSED REDUCTION MANDIBLE  02/20/2012   Procedure: CLOSED REDUCTION MANDIBULAR;  Surgeon: Isac Caddy, DDS;  Location: [place];  Service: Oral Surgery;  Laterality: Right;  Closed reduction zygonach arch fx   EXCISION  MELANOMA WITH SENTINEL LYMPH NODE BIOPSY Left 05/24/2021   Procedure: MELANOMA WIDE LOCAL EXCISION;  Surgeon: Clovis Riley, MD;  Location: [place];  Service: General;  Laterality: Left;   JOINT REPLACEMENT     KNEE CARTILAGE SURGERY Left    ORIF FACIAL FRACTURE  02/20/2012   Procedure: OPEN REDUCTION INTERNAL FIXATION (ORIF) MULTIPLE FACIAL FRACTURES;  Surgeon: Isac Caddy, DDS;  Location: [place];  Service: Oral Surgery;  Laterality: Right;  ORIF Rt ZMC (zygomaxillary)   SENTINEL NODE BIOPSY Left 05/24/2021   Procedure: SENTINEL NODE BIOPSY;  Surgeon: Clovis Riley, MD;  Location: [place];  Service: General;  Laterality: Left;   TOTAL KNEE ARTHROPLASTY Left 10/12/2016   Procedure: TOTAL KNEE ARTHROPLASTY;  Surgeon: Carole Civil, MD;  Location: [facility];  Service: Orthopedics;  Laterality: Left;    Family History  Problem Relation Age of Onset   Arthritis Mother    Heart disease Mother        enlarged heart   Anemia Mother    Stroke Mother    Hearing loss Mother    Arthritis Father    Diabetes Father    Hypertension Father    Cancer Sister        breast   Diabetes Brother    CAD Brother    Healthy Daughter    Healthy Daughter       Controlled substance contract: n/a     Review of Systems  Constitutional:  Negative for diaphoresis.  Eyes:  Negative for pain.  Respiratory:  Negative for shortness of breath.   Cardiovascular:  Negative for chest pain, palpitations and leg swelling.  Gastrointestinal:  Negative for abdominal pain.  Endocrine: Negative for polydipsia.  Skin:  Negative for rash.  Neurological:  Negative for dizziness, weakness and headaches.  Hematological:  Does not bruise/bleed easily.  All other systems reviewed and are negative.      Objective:   Physical Exam Vitals and nursing note reviewed.  Constitutional:      General: She is not in acute distress.    Appearance: Normal appearance. She is well-developed.  HENT:     Head:  Normocephalic.     Right Ear: Tympanic membrane normal.     Left Ear: Tympanic membrane normal.     Nose: Nose normal.     Mouth/Throat:     Mouth: Mucous membranes are moist.  Eyes:     Pupils: Pupils are equal, round, and reactive to light.  Neck:     Vascular: No carotid bruit or JVD.  Cardiovascular:     Rate and Rhythm: Normal rate and regular rhythm.     Heart sounds: Normal heart sounds.  Pulmonary:     Effort: Pulmonary effort is normal. No respiratory distress.     Breath  sounds: Normal breath sounds. No wheezing or rales.  Chest:     Chest wall: No tenderness.  Abdominal:     General: Bowel sounds are normal. There is no distension or abdominal bruit.     Palpations: Abdomen is soft. There is no hepatomegaly, splenomegaly, mass or pulsatile mass.     Tenderness: There is no abdominal tenderness.  Musculoskeletal:        General: Normal range of motion.     Cervical back: Normal range of motion and neck supple.     Right lower leg: Edema (1+) present.     Left lower leg: Edema (1+) present.  Lymphadenopathy:     Cervical: No cervical adenopathy.  Skin:    General: Skin is warm and dry.     Comments: Brownish spots all over face  Neurological:     Mental Status: She is alert and oriented to person, place, and time.     Deep Tendon Reflexes: Reflexes are normal and symmetric.  Psychiatric:        Behavior: Behavior normal.        Thought Content: Thought content normal.        Judgment: Judgment normal.     BP 131/73   Pulse 71   Temp (!) 97.1 F (36.2 C) (Temporal)   Resp 20   Ht '5\' 2"'$  (1.575 m)   Wt 202 lb (91.6 kg)   SpO2 98%   BMI 36.95 kg/m   Hgba1c 6.3%     Assessment & Plan:  INDA MCGLOTHEN comes in today with chief complaint of Medical Management of Chronic Issues   Diagnosis and orders addressed:  1. Primary hypertension Low sodium diet - CBC with Differential/Platelet - CMP14+EGFR - olmesartan-hydrochlorothiazide (BENICAR HCT) 40-25  MG tablet; Take 1 tablet by mouth daily.  Dispense: 90 tablet; Refill: 1  2. Hyperlipidemia, unspecified hyperlipidemia type Low fat diet  - Lipid panel - atorvastatin (LIPITOR) 40 MG tablet; Take 1 tablet (40 mg total) by mouth daily.  Dispense: 90 tablet; Refill: 1  3. Hypothyroidism (acquired) Labs pending - Thyroid Panel With TSH - levothyroxine (SYNTHROID) 50 MCG tablet; Take 1 tablet (50 mcg total) by mouth daily.  Dispense: 90 tablet; Refill: 1  4. Controlled type 2 diabetes mellitus without complication, without long-term current use of insulin (HCC) Continue to watch carbs in diet - Bayer DCA Hb A1c Waived  5. PAD (peripheral artery disease) (HCC) Elevate legs when sitting  6. Hypokalemia Labs pending  7. Peripheral edema Elevate legs when sitting  8. Osteopenia of lumbar spine Weight bearing exrcises - DG [facility] DEXA  9. Vitamin D deficiency Continue vittamin d supplement  10. BMI 32.0-32.9,adult Discussed diet and exercise for person with BMI >25 Will recheck weight in 3-6 months    Labs pending Health Maintenance reviewed Diet and exercise encouraged  Follow up plan: 3 months   Mary-Margaret Hassell Done, FNP

## 2022-03-29 LAB — CMP14+EGFR
ALT: 21 IU/L (ref 0–32)
AST: 19 IU/L (ref 0–40)
Albumin/Globulin Ratio: 2 (ref 1.2–2.2)
Albumin: 4.6 g/dL (ref 3.8–4.8)
Alkaline Phosphatase: 85 IU/L (ref 44–121)
BUN/Creatinine Ratio: 20 (ref 12–28)
BUN: 15 mg/dL (ref 8–27)
Bilirubin Total: 1.2 mg/dL (ref 0.0–1.2)
CO2: 24 mmol/L (ref 20–29)
Calcium: 9.3 mg/dL (ref 8.7–10.3)
Chloride: 97 mmol/L (ref 96–106)
Creatinine, Ser: 0.75 mg/dL (ref 0.57–1.00)
Globulin, Total: 2.3 g/dL (ref 1.5–4.5)
Glucose: 126 mg/dL — ABNORMAL HIGH (ref 70–99)
Potassium: 3.8 mmol/L (ref 3.5–5.2)
Sodium: 139 mmol/L (ref 134–144)
Total Protein: 6.9 g/dL (ref 6.0–8.5)
eGFR: 84 mL/min/{1.73_m2} (ref >59)

## 2022-03-29 LAB — LIPID PANEL
Chol/HDL Ratio: 2.9 ratio (ref 0.0–4.4)
Cholesterol, Total: 120 mg/dL (ref 100–199)
HDL: 41 mg/dL (ref >39)
LDL Chol Calc (NIH): 53 mg/dL (ref 0–99)
Triglycerides: 153 mg/dL — ABNORMAL HIGH (ref 0–149)
VLDL Cholesterol Cal: 26 mg/dL (ref 5–40)

## 2022-03-29 LAB — CBC WITH DIFFERENTIAL/PLATELET
Basophils Absolute: 0.1 10*3/uL (ref 0.0–0.2)
Basos: 1 %
EOS (ABSOLUTE): 0.2 10*3/uL (ref 0.0–0.4)
Eos: 2 %
Hematocrit: 38.6 % (ref 34.0–46.6)
Hemoglobin: 13 g/dL (ref 11.1–15.9)
Immature Grans (Abs): 0 10*3/uL (ref 0.0–0.1)
Immature Granulocytes: 0 %
Lymphocytes Absolute: 1.4 10*3/uL (ref 0.7–3.1)
Lymphs: 20 %
MCH: 30.4 pg (ref 26.6–33.0)
MCHC: 33.7 g/dL (ref 31.5–35.7)
MCV: 90 fL (ref 79–97)
Monocytes Absolute: 0.5 10*3/uL (ref 0.1–0.9)
Monocytes: 7 %
Neutrophils Absolute: 4.8 10*3/uL (ref 1.4–7.0)
Neutrophils: 70 %
Platelets: 246 10*3/uL (ref 150–450)
RBC: 4.28 x10E6/uL (ref 3.77–5.28)
RDW: 14 % (ref 11.7–15.4)
WBC: 6.9 10*3/uL (ref 3.4–10.8)

## 2022-03-29 LAB — THYROID PANEL WITH TSH
Free Thyroxine Index: 1.9 (ref 1.2–4.9)
T3 Uptake Ratio: 28 % (ref 24–39)
T4, Total: 6.9 ug/dL (ref 4.5–12.0)
TSH: 2.38 u[IU]/mL (ref 0.450–4.500)

## 2022-03-31 DIAGNOSIS — D485 Neoplasm of uncertain behavior of skin: Secondary | ICD-10-CM | POA: Diagnosis not present

## 2022-03-31 DIAGNOSIS — D225 Melanocytic nevi of trunk: Secondary | ICD-10-CM | POA: Diagnosis not present

## 2022-04-28 ENCOUNTER — Encounter: Payer: Self-pay | Admitting: Radiology

## 2022-05-04 ENCOUNTER — Encounter: Payer: Self-pay | Admitting: *Deleted

## 2022-05-05 ENCOUNTER — Ambulatory Visit (INDEPENDENT_AMBULATORY_CARE_PROVIDER_SITE_OTHER): Payer: 59 | Admitting: Nurse Practitioner

## 2022-05-05 ENCOUNTER — Encounter: Payer: Self-pay | Admitting: Nurse Practitioner

## 2022-05-05 VITALS — BP 134/71 | HR 82 | Temp 97.0°F | Resp 20 | Ht 62.0 in | Wt 204.0 lb

## 2022-05-05 DIAGNOSIS — N950 Postmenopausal bleeding: Secondary | ICD-10-CM | POA: Diagnosis not present

## 2022-05-05 DIAGNOSIS — M545 Low back pain, unspecified: Secondary | ICD-10-CM | POA: Diagnosis not present

## 2022-05-05 LAB — MICROSCOPIC EXAMINATION
Bacteria, UA: NONE SEEN
Epithelial Cells (non renal): NONE SEEN /hpf (ref 0–10)
RBC, Urine: NONE SEEN /hpf (ref 0–2)
Renal Epithel, UA: NONE SEEN /hpf
WBC, UA: NONE SEEN /hpf (ref 0–5)

## 2022-05-05 LAB — URINALYSIS, COMPLETE
Bilirubin, UA: NEGATIVE
Glucose, UA: NEGATIVE
Ketones, UA: NEGATIVE
Nitrite, UA: NEGATIVE
Protein,UA: NEGATIVE
Specific Gravity, UA: 1.015 (ref 1.005–1.030)
Urobilinogen, Ur: 0.2 mg/dL (ref 0.2–1.0)
pH, UA: 7.5 (ref 5.0–7.5)

## 2022-05-05 NOTE — Patient Instructions (Signed)
Postmenopausal Bleeding Postmenopausal bleeding is any bleeding that occurs after menopause. Menopause is a time in a woman's life when monthly periods stop. Any type of bleeding after menopause should be checked by your doctor. Treatment will depend on the cause. This kind of bleeding can be caused by: Taking hormones during menopause. Low or high amounts of female hormones in the body. This can cause the lining of the womb (uterus) to become too thin or too thick. Cancer. Growths in the womb that are not cancer. Follow these instructions at home:  Watch for any changes in your symptoms. Let your doctor know about them. Avoid using tampons and douches as told by your doctor. Change your pads regularly. Get regular pelvic exams. This includes Pap tests. Take iron pills as told by your doctor. Take over-the-counter and prescription medicines only as told by your doctor. Keep all follow-up visits. Contact a doctor if: You have new bleeding from the vagina after menopause. You have pain in your belly (abdomen). Get help right away if: You have a fever or chills. You have very bad pain with bleeding. You have clumps of blood (blood clots) coming from your vagina. You have a lot of bleeding, and: You use more than 1 pad an hour. This kind of bleeding has never happened before. You have headaches. You feel dizzy or you feel like you are going to pass out (faint). Summary Any type of bleeding after menopause should be checked by your doctor. Avoid using tampons or douches. Get regular pelvic exams. This includes Pap tests. Contact a doctor if you have new bleeding or pain in your belly. Watch for any changes in your symptoms. Let your doctor know about them. This information is not intended to replace advice given to you by your health care provider. Make sure you discuss any questions you have with your health care provider. Document Revised: 08/01/2019 Document Reviewed:  08/01/2019 Elsevier Patient Education  [place].

## 2022-05-05 NOTE — Progress Notes (Signed)
   Subjective:    Patient ID: Mckenzie Leonard, female    DOB: 1950/04/08, 72 y.o.   MRN: [id number]   Chief Complaint: Back Pain and Hematuria   HPI Patient c/o low back pain for about 1 week. Is not hurting now. She has also has been spotting blood for the last 2 days. Only sees it when she wipes. Does not see any in her panties or in the toilet.   Patient Active Problem List   Diagnosis Date Noted   Peripheral edema 09/03/2019   PAD (peripheral artery disease) ([place]) 09/03/2019   S/P total knee replacement, left 10/12/2016 12/12/2016   Osteopenia 04/30/2014   BMI 32.0-32.9,adult 09/16/2013   Vitamin D deficiency 09/16/2013   Hypokalemia 02/18/2012   HTN (hypertension) 02/18/2012   Type 2 diabetes mellitus, controlled ([place]) 02/18/2012   Hyperlipidemia 02/18/2012   Hypothyroidism (acquired) 02/18/2012       Review of Systems  Constitutional:  Negative for diaphoresis.  Eyes:  Negative for pain.  Respiratory:  Negative for shortness of breath.   Cardiovascular:  Negative for chest pain, palpitations and leg swelling.  Gastrointestinal:  Negative for abdominal pain.  Endocrine: Negative for polydipsia.  Genitourinary:  Positive for frequency, urgency and vaginal bleeding. Negative for dysuria and hematuria.  Skin:  Negative for rash.  Neurological:  Negative for dizziness, weakness and headaches.  Hematological:  Does not bruise/bleed easily.  All other systems reviewed and are negative.      Objective:   Physical Exam Vitals reviewed.  Constitutional:      Appearance: Normal appearance.  Cardiovascular:     Rate and Rhythm: Normal rate and regular rhythm.     Heart sounds: Normal heart sounds.  Pulmonary:     Effort: Pulmonary effort is normal.     Breath sounds: Normal breath sounds.  Abdominal:     General: Abdomen is flat.     Tenderness: There is no right CVA tenderness or left CVA tenderness.  Skin:    General: Skin is warm.  Neurological:     Mental  Status: She is alert and oriented to person, place, and time.  Psychiatric:        Behavior: Behavior normal.    BP 134/71   Pulse 82   Temp (!) 97 F (36.1 C) (Temporal)   Resp 20   Ht '5\' 2"'$  (1.575 m)   Wt 204 lb (92.5 kg)   SpO2 98%   BMI 37.31 kg/m         Assessment & Plan:  Mckenzie Leonard in today with chief complaint of Back Pain and Hematuria   1. Acute bilateral low back pain without sciatica Motrin otc as needed - Urinalysis, Complete - Urine Culture  2. Postmenopausal bleeding Will schedule for endometrial bx.    The above assessment and management plan was discussed with the patient. The patient verbalized understanding of and has agreed to the management plan. Patient is aware to call the clinic if symptoms persist or worsen. Patient is aware when to return to the clinic for a follow-up visit. Patient educated on when it is appropriate to go to the emergency department.   Mary-Margaret Hassell Done, FNP

## 2022-05-06 LAB — URINE CULTURE: Organism ID, Bacteria: NO GROWTH

## 2022-05-13 ENCOUNTER — Ambulatory Visit (INDEPENDENT_AMBULATORY_CARE_PROVIDER_SITE_OTHER): Payer: 59 | Admitting: Nurse Practitioner

## 2022-05-13 ENCOUNTER — Encounter: Payer: Self-pay | Admitting: Nurse Practitioner

## 2022-05-13 VITALS — BP 116/70 | HR 85 | Temp 97.2°F | Resp 20 | Ht 62.0 in | Wt 203.0 lb

## 2022-05-13 DIAGNOSIS — N950 Postmenopausal bleeding: Secondary | ICD-10-CM | POA: Diagnosis not present

## 2022-05-13 NOTE — Progress Notes (Signed)
   Subjective:    Patient ID: Mckenzie Leonard, female    DOB: June 14, 1950, 72 y.o.   MRN: [id number]   Chief Complaint: post menopausal bleeding  HPI Patient was seen in office on 05/05/22. She was c/o vaginal bleeding. She has been menopausal for years. Was only spotting , but was visible.  Patient Active Problem List   Diagnosis Date Noted   Peripheral edema 09/03/2019   PAD (peripheral artery disease) ([place]) 09/03/2019   S/P total knee replacement, left 10/12/2016 12/12/2016   Osteopenia 04/30/2014   BMI 32.0-32.9,adult 09/16/2013   Vitamin D deficiency 09/16/2013   Hypokalemia 02/18/2012   HTN (hypertension) 02/18/2012   Type 2 diabetes mellitus, controlled ([place]) 02/18/2012   Hyperlipidemia 02/18/2012   Hypothyroidism (acquired) 02/18/2012       Review of Systems  Constitutional:  Negative for diaphoresis.  Eyes:  Negative for pain.  Respiratory:  Negative for shortness of breath.   Cardiovascular:  Negative for chest pain, palpitations and leg swelling.  Gastrointestinal:  Negative for abdominal pain.  Endocrine: Negative for polydipsia.  Skin:  Negative for rash.  Neurological:  Negative for dizziness, weakness and headaches.  Hematological:  Does not bruise/bleed easily.  All other systems reviewed and are negative.      Objective:   Physical Exam Constitutional:      Appearance: Normal appearance.  Cardiovascular:     Rate and Rhythm: Normal rate and regular rhythm.     Heart sounds: Normal heart sounds.  Pulmonary:     Effort: Pulmonary effort is normal.     Breath sounds: Normal breath sounds.  Genitourinary:    General: Normal vulva.     Vagina: No vaginal discharge.     Comments: No adnexal masses or tenderness Cervix parous and pink Skin:    General: Skin is warm.  Neurological:     General: No focal deficit present.     Mental Status: She is alert and oriented to person, place, and time.  Psychiatric:        Mood and Affect: Mood normal.         Behavior: Behavior normal.      Procedure: endo metrail bx attempted Lithotomy position Cervix cleaned with betadine Single tooth tenaculum to anterior lip of cervix Unable to get into cervix Tenaculum removed Patient tolerated well.       Assessment & Plan:  TAVA MOHS in today with chief complaint of No chief complaint on file.   1. Post-menopausal bleeding Referral to gyn for endometrial bx.    The above assessment and management plan was discussed with the patient. The patient verbalized understanding of and has agreed to the management plan. Patient is aware to call the clinic if symptoms persist or worsen. Patient is aware when to return to the clinic for a follow-up visit. Patient educated on when it is appropriate to go to the emergency department.   Mary-Margaret Hassell Done, FNP

## 2022-06-09 DIAGNOSIS — R9389 Abnormal findings on diagnostic imaging of other specified body structures: Secondary | ICD-10-CM | POA: Diagnosis not present

## 2022-06-15 ENCOUNTER — Telehealth: Payer: Self-pay

## 2022-06-15 DIAGNOSIS — K921 Melena: Secondary | ICD-10-CM

## 2022-06-15 DIAGNOSIS — Z1211 Encounter for screening for malignant neoplasm of colon: Secondary | ICD-10-CM

## 2022-06-15 NOTE — Telephone Encounter (Signed)
Mckenzie Leonard with [facility] [employer] through [facility] called patient done FOBT and results were positive for blood in stool - patient is aware. I called and spoke to the patient and she states that she is not having any problems, denies being able to see any blood in stools or having any pains. Patient has had some issues with vaginal bleeding and has send Erskin and Chad  - surgery has been scheduled for May 3rd. Patient is aware that Daphine Deutscher is out of the office and is okay to wait until tomorrow. Please review and advise if okay to wait for Daphine Deutscher to address tomorrow.

## 2022-06-16 NOTE — Telephone Encounter (Signed)
Will need to dee GI for colonoscopy after she recovers from surgery

## 2022-06-17 NOTE — Addendum Note (Signed)
Addended by: Dorene Sorrow on: 06/17/2022 03:37 PM   Modules accepted: Orders

## 2022-06-17 NOTE — Telephone Encounter (Signed)
Pt made aware and understood. States that she does not want to go back to [facility] for colon that she would like to go to [place] or [place].  Referral placed.

## 2022-06-20 LAB — HM DIABETES EYE EXAM

## 2022-06-21 ENCOUNTER — Encounter (INDEPENDENT_AMBULATORY_CARE_PROVIDER_SITE_OTHER): Payer: Self-pay | Admitting: *Deleted

## 2022-06-30 DIAGNOSIS — R9389 Abnormal findings on diagnostic imaging of other specified body structures: Secondary | ICD-10-CM | POA: Diagnosis not present

## 2022-07-01 DIAGNOSIS — M4802 Spinal stenosis, cervical region: Secondary | ICD-10-CM | POA: Diagnosis not present

## 2022-07-01 DIAGNOSIS — Z7982 Long term (current) use of aspirin: Secondary | ICD-10-CM | POA: Diagnosis not present

## 2022-07-01 DIAGNOSIS — E785 Hyperlipidemia, unspecified: Secondary | ICD-10-CM | POA: Diagnosis not present

## 2022-07-01 DIAGNOSIS — E039 Hypothyroidism, unspecified: Secondary | ICD-10-CM | POA: Diagnosis not present

## 2022-07-01 DIAGNOSIS — R9389 Abnormal findings on diagnostic imaging of other specified body structures: Secondary | ICD-10-CM | POA: Diagnosis not present

## 2022-07-01 DIAGNOSIS — I10 Essential (primary) hypertension: Secondary | ICD-10-CM | POA: Diagnosis not present

## 2022-07-01 DIAGNOSIS — S0292XS Unspecified fracture of facial bones, sequela: Secondary | ICD-10-CM | POA: Diagnosis not present

## 2022-07-01 DIAGNOSIS — E119 Type 2 diabetes mellitus without complications: Secondary | ICD-10-CM | POA: Diagnosis not present

## 2022-08-08 ENCOUNTER — Encounter (INDEPENDENT_AMBULATORY_CARE_PROVIDER_SITE_OTHER): Payer: Self-pay | Admitting: Gastroenterology

## 2022-08-08 ENCOUNTER — Ambulatory Visit (INDEPENDENT_AMBULATORY_CARE_PROVIDER_SITE_OTHER): Payer: 59 | Admitting: Gastroenterology

## 2022-08-08 VITALS — BP 133/73 | HR 72 | Temp 96.0°F | Ht 63.0 in | Wt 204.4 lb

## 2022-08-08 DIAGNOSIS — R195 Other fecal abnormalities: Secondary | ICD-10-CM | POA: Diagnosis not present

## 2022-08-08 NOTE — Progress Notes (Signed)
Referring Provider: Bennie Pierini, * Primary Care Physician:  Bennie Pierini, FNP Primary GI Physician: new   Chief Complaint  Patient presents with   Blood In Stools    Patient here today due to having blood in stools per test done with [facility]. Patient denies any sight of dark or bloody stools, dizziness, chest pain, shortness of breath, or fatigue. January 29,2024  Hgb was 13.0 Hct 38.6. Last Tcs was 2009.   HPI:   Mckenzie Leonard is a 72 y.o. female with past medical history of arthritis, diabetes, high cholesterol, HTN, hypothyroidism   Patient presenting today for positive fobt   Last Hgb 13 in january 2024   Patient states that she had a stool test that showed blood. She denies rectal bleeding or melena. No abdominal pain, changes in bowel habits, weight loss or changes in appetite. Denies nausea, vomiting, dysphagia or odynophagia. She has no GI complaints at this time. Reports last TCS was in 2009, told to repeat in 5 years but lost to follow up for this.   NSAID use: takes naproxen every day x 4-5 years  Social hx: no etoh or tobacco  Fam hx: no CRC or liver disease   Last Colonoscopy: 2009 diverticulosis, single small polyp (told to repeat in 5 years)  Last Endoscopy: never   Recommendations:    Past Medical History:  Diagnosis Date   Arthritis    Diabetes mellitus without complication (HCC)    High cholesterol    Hypertension    Hypothyroidism    PONV (postoperative nausea and vomiting)    Thyroid disease     Past Surgical History:  Procedure Laterality Date   BREAST BIOPSY Right    negative   CARPAL TUNNEL RELEASE     Right   CLOSED REDUCTION MANDIBLE  02/20/2012   Procedure: CLOSED REDUCTION MANDIBULAR;  Surgeon: Francene Finders, DDS;  Location: [facility] OR;  Service: Oral Surgery;  Laterality: Right;  Closed reduction zygonach arch fx   EXCISION MELANOMA WITH SENTINEL LYMPH NODE BIOPSY Left 05/24/2021   Procedure: MELANOMA WIDE LOCAL  EXCISION;  Surgeon: Berna Bue, MD;  Location: [facility] OR;  Service: General;  Laterality: Left;   JOINT REPLACEMENT     KNEE CARTILAGE SURGERY Left    ORIF FACIAL FRACTURE  02/20/2012   Procedure: OPEN REDUCTION INTERNAL FIXATION (ORIF) MULTIPLE FACIAL FRACTURES;  Surgeon: Francene Finders, DDS;  Location: [facility] OR;  Service: Oral Surgery;  Laterality: Right;  ORIF Rt [facility] (zygomaxillary)   SENTINEL NODE BIOPSY Left 05/24/2021   Procedure: SENTINEL NODE BIOPSY;  Surgeon: Berna Bue, MD;  Location: [facility] OR;  Service: General;  Laterality: Left;   TOTAL KNEE ARTHROPLASTY Left 10/12/2016   Procedure: TOTAL KNEE ARTHROPLASTY;  Surgeon: Vickki Hearing, MD;  Location: [facility] ORS;  Service: Orthopedics;  Laterality: Left;    Current Outpatient Medications  Medication Sig Dispense Refill   Ascorbic Acid (VITAMIN C) 100 MG tablet Take 100 mg by mouth daily.     aspirin EC 81 MG tablet Take 81 mg by mouth daily. Swallow whole.     atorvastatin (LIPITOR) 40 MG tablet Take 1 tablet (40 mg total) by mouth daily. 90 tablet 1   BIOTIN PO Take 1 tablet by mouth daily.     Blood Glucose Monitoring Suppl (BLOOD GLUCOSE METER KIT AND SUPPLIES) KIT Dispense based on patient and insurance preference. Use to check BG once daily.  ICD 10 code = E11.9 1 each 0  calcium-vitamin D (OSCAL WITH D) 500-200 MG-UNIT tablet Take 1 tablet by mouth 2 (two) times daily.     Cholecalciferol (VITAMIN D) 2000 units CAPS Take 6,000 Units by mouth daily.      glucose blood (ONETOUCH ULTRA) test strip CHECK BLOOD SUGAR DAILY AS DIRECTED 100 strip 9   levothyroxine (SYNTHROID) 50 MCG tablet Take 1 tablet (50 mcg total) by mouth daily. 90 tablet 1   magnesium gluconate (MAGONATE) 500 MG tablet Take 500 mg by mouth daily.     Misc Natural Products (JOINT HEALTH PO) Take 1 tablet by mouth daily.     Multiple Vitamin (MULTIVITAMIN WITH MINERALS) TABS tablet Take 1 tablet by mouth daily.     naproxen sodium (ALEVE) 220 MG tablet  Take 220 mg by mouth daily.     olmesartan-hydrochlorothiazide (BENICAR HCT) 40-25 MG tablet Take 1 tablet by mouth daily. 90 tablet 1   No current facility-administered medications for this visit.    Allergies as of 08/08/2022 - Review Complete 08/08/2022  Allergen Reaction Noted   Sulfa antibiotics Anaphylaxis 02/18/2012   Statins Other (See Comments) 06/26/2012   Tetanus toxoids  02/18/2012    Family History  Problem Relation Age of Onset   Arthritis Mother    Heart disease Mother        enlarged heart   Anemia Mother    Stroke Mother    Hearing loss Mother    Arthritis Father    Diabetes Father    Hypertension Father    Cancer Sister        breast   Diabetes Brother    CAD Brother    Healthy Daughter    Healthy Daughter     Social History   Socioeconomic History   Marital status: Divorced    Spouse name: Not on file   Number of children: 2   Years of education: 10   Highest education level: GED or equivalent  Occupational History   Occupation: retired    Comment: Designer, fashion/clothing  Tobacco Use   Smoking status: Never   Smokeless tobacco: Never  Vaping Use   Vaping Use: Never used  Substance and Sexual Activity   Alcohol use: No   Drug use: No   Sexual activity: Yes    Birth control/protection: Post-menopausal  Other Topics Concern   Not on file  Social History Narrative   Spends lots of time with her daughters and sister, goes to church and senior center frequently; stays on the go   Social Determinants of Health   Financial Resource Strain: Low Risk  (10/05/2021)   Overall Financial Resource Strain (CARDIA)    Difficulty of Paying Living Expenses: Not hard at all  Food Insecurity: No Food Insecurity (10/05/2021)   Hunger Vital Sign    Worried About Running Out of Food in the Last Year: Never true    Ran Out of Food in the Last Year: Never true  Transportation Needs: No Transportation Needs (10/05/2021)   PRAPARE - Administrator, Civil Service  (Medical): No    Lack of Transportation (Non-Medical): No  Physical Activity: Sufficiently Active (10/05/2021)   Exercise Vital Sign    Days of Exercise per Week: 4 days    Minutes of Exercise per Session: 40 min  Stress: No Stress Concern Present (10/05/2021)   [employer] of Occupational Health - Occupational Stress Questionnaire    Feeling of Stress : Not at all  Social Connections: Moderately Integrated (10/05/2021)   Social Connection  and Isolation Panel [NHANES]    Frequency of Communication with Friends and Family: More than three times a week    Frequency of Social Gatherings with Friends and Family: More than three times a week    Attends Religious Services: More than 4 times per year    Active Member of [employer] or Organizations: Yes    Attends Engineer, structural: More than 4 times per year    Marital Status: Divorced    Review of systems General: negative for malaise, night sweats, fever, chills, weight loss Neck: Negative for lumps, goiter, pain and significant neck swelling Resp: Negative for cough, wheezing, dyspnea at rest CV: Negative for chest pain, leg swelling, palpitations, orthopnea GI: denies melena, hematochezia, nausea, vomiting, diarrhea, constipation, dysphagia, odyonophagia, early satiety or unintentional weight loss.  MSK: Negative for joint pain or swelling, back pain, and muscle pain. Derm: Negative for itching or rash Psych: Denies depression, anxiety, memory loss, confusion. No homicidal or suicidal ideation.  Heme: Negative for prolonged bleeding, bruising easily, and swollen nodes. Endocrine: Negative for cold or heat intolerance, polyuria, polydipsia and goiter. Neuro: negative for tremor, gait imbalance, syncope and seizures. The remainder of the review of systems is noncontributory.  Physical Exam: BP 133/73 (BP Location: Left Arm, Patient Position: Sitting, Cuff Size: Large)   Pulse 72   Temp (!) 96 F (35.6 C) (Temporal)   Ht 5\' 3"   (1.6 m)   Wt 204 lb 6.4 oz (92.7 kg)   BMI 36.21 kg/m  General:   Alert and oriented. No distress noted. Pleasant and cooperative.  Head:  Normocephalic and atraumatic. Eyes:  Conjuctiva clear without scleral icterus. Mouth:  Oral mucosa pink and moist. Good dentition. No lesions. Heart: Normal rate and rhythm, s1 and s2 heart sounds present.  Lungs: Clear lung sounds in all lobes. Respirations equal and unlabored. Abdomen:  +BS, soft, non-tender and non-distended. No rebound or guarding. No HSM or masses noted. Derm: No palmar erythema or jaundice Msk:  Symmetrical without gross deformities. Normal posture. Extremities:  Without edema. Neurologic:  Alert and  oriented x4 Psych:  Alert and cooperative. Normal mood and affect.  Invalid input(s): "6 MONTHS"   ASSESSMENT: Mckenzie Leonard is a 72 y.o. female presenting today for positive FOBT   FOBT done by [facility] as part of wellness visit, was positive. Last colonoscopy 2009. Patient denies any GI complaints today. Recommend proceeding with Colonoscopy for further evaluation to rule out causes of bleeding in the lower GI tract. Indications, risks and benefits of procedure discussed in detail with patient. Patient verbalized understanding and is in agreement to proceed with Colonoscopy.     PLAN:  Schedule colonoscopy-ASA III   All questions were answered, patient verbalized understanding and is in agreement with plan as outlined above.    Follow Up: TBD after procedure   Jazman Reuter L. Jeanmarie Hubert, MSN, APRN, AGNP-C Adult-Gerontology Nurse Practitioner [facility]   I have reviewed the note and agree with the APP's assessment as described in this progress note  Katrinka Blazing, MD Gastroenterology and Hepatology [facility]

## 2022-08-08 NOTE — Patient Instructions (Signed)
We will get you scheduled for a colonoscopy for further evaluation of your positive stool testing  Follow up will be determined after colonoscopy

## 2022-08-09 ENCOUNTER — Other Ambulatory Visit (INDEPENDENT_AMBULATORY_CARE_PROVIDER_SITE_OTHER): Payer: Self-pay | Admitting: Gastroenterology

## 2022-08-09 MED ORDER — PEG 3350-KCL-NA BICARB-NACL 420 G PO SOLR: 4000.0000 mL | Freq: Once | ORAL | 0 refills | Status: AC

## 2022-08-10 ENCOUNTER — Encounter (INDEPENDENT_AMBULATORY_CARE_PROVIDER_SITE_OTHER): Payer: Self-pay

## 2022-08-25 ENCOUNTER — Other Ambulatory Visit (HOSPITAL_COMMUNITY): Payer: Self-pay | Admitting: Nurse Practitioner

## 2022-08-25 DIAGNOSIS — Z1231 Encounter for screening mammogram for malignant neoplasm of breast: Secondary | ICD-10-CM

## 2022-09-08 DIAGNOSIS — Z85820 Personal history of malignant melanoma of skin: Secondary | ICD-10-CM | POA: Diagnosis not present

## 2022-09-08 DIAGNOSIS — D225 Melanocytic nevi of trunk: Secondary | ICD-10-CM | POA: Diagnosis not present

## 2022-09-08 DIAGNOSIS — Z1283 Encounter for screening for malignant neoplasm of skin: Secondary | ICD-10-CM | POA: Diagnosis not present

## 2022-09-08 DIAGNOSIS — L570 Actinic keratosis: Secondary | ICD-10-CM | POA: Diagnosis not present

## 2022-09-08 DIAGNOSIS — Z08 Encounter for follow-up examination after completed treatment for malignant neoplasm: Secondary | ICD-10-CM | POA: Diagnosis not present

## 2022-09-08 DIAGNOSIS — X32XXXD Exposure to sunlight, subsequent encounter: Secondary | ICD-10-CM | POA: Diagnosis not present

## 2022-09-12 NOTE — Patient Instructions (Signed)
Mckenzie Leonard  09/12/2022     @PREFPERIOPPHARMACY @   Your procedure is scheduled on  09/16/2022.   Report to [facility] at  0830  A.M.   Call this number if you have problems the morning of surgery:  [phone]  If you experience any cold or flu symptoms such as cough, fever, chills, shortness of breath, etc. between now and your scheduled surgery, please notify us at the above number.   Remember:  Follow the diet and prep instructions given to you by the office.     DO NOT take any medications for diabetes the morning of your procedure.     Take these medicines the morning of surgery with A SIP OF WATER                                      levothyroxine.     Do not wear jewelry, make-up or nail polish, including gel polish,  artificial nails, or any other type of covering on natural nails (fingers and  toes).  Do not wear lotions, powders, or perfumes, or deodorant.  Do not shave 48 hours prior to surgery.  Men may shave face and neck.  Do not bring valuables to the hospital.  [facility] is not responsible for any belongings or valuables.  Contacts, dentures or bridgework may not be worn into surgery.  Leave your suitcase in the car.  After surgery it may be brought to your room.  For patients admitted to the hospital, discharge time will be determined by your treatment team.  Patients discharged the day of surgery will not be allowed to drive home and must have someone with them for 24 hours.    Special instructions:   DO NOT smoke tobacco or vape for 24 hours before your procedure.  Please read over the following fact sheets that you were given. Anesthesia Post-op Instructions and Care and Recovery After Surgery      Colonoscopy, Adult, Care After The following information offers guidance on how to care for yourself after your procedure. Your health care provider may also give you more specific instructions. If you have problems or questions, contact  your health care provider. What can I expect after the procedure? After the procedure, it is common to have: A small amount of blood in your stool for 24 hours after the procedure. Some gas. Mild cramping or bloating of your abdomen. Follow these instructions at home: Eating and drinking  Drink enough fluid to keep your urine pale yellow. Follow instructions from your health care provider about eating or drinking restrictions. Resume your normal diet as told by your health care provider. Avoid heavy or fried foods that are hard to digest. Activity Rest as told by your health care provider. Avoid sitting for a long time without moving. Get up to take short walks every 1-2 hours. This is important to improve blood flow and breathing. Ask for help if you feel weak or unsteady. Return to your normal activities as told by your health care provider. Ask your health care provider what activities are safe for you. Managing cramping and bloating  Try walking around when you have cramps or feel bloated. If directed, apply heat to your abdomen as told by your health care provider. Use the heat source that your health care provider recommends, such as a moist heat pack or a heating  pad. Place a towel between your skin and the heat source. Leave the heat on for 20-30 minutes. Remove the heat if your skin turns bright red. This is especially important if you are unable to feel pain, heat, or cold. You have a greater risk of getting burned. General instructions If you were given a sedative during the procedure, it can affect you for several hours. Do not drive or operate machinery until your health care provider says that it is safe. For the first 24 hours after the procedure: Do not sign important documents. Do not drink alcohol. Do your regular daily activities at a slower pace than normal. Eat soft foods that are easy to digest. Take over-the-counter and prescription medicines only as told by your  health care provider. Keep all follow-up visits. This is important. Contact a health care provider if: You have blood in your stool 2-3 days after the procedure. Get help right away if: You have more than a small spotting of blood in your stool. You have large blood clots in your stool. You have swelling of your abdomen. You have nausea or vomiting. You have a fever. You have increasing pain in your abdomen that is not relieved with medicine. These symptoms may be an emergency. Get help right away. Call 911. Do not wait to see if the symptoms will go away. Do not drive yourself to the hospital. Summary After the procedure, it is common to have a small amount of blood in your stool. You may also have mild cramping and bloating of your abdomen. If you were given a sedative during the procedure, it can affect you for several hours. Do not drive or operate machinery until your health care provider says that it is safe. Get help right away if you have a lot of blood in your stool, nausea or vomiting, a fever, or increased pain in your abdomen. This information is not intended to replace advice given to you by your health care provider. Make sure you discuss any questions you have with your health care provider. Document Revised: 03/29/2022 Document Reviewed: 10/07/2020 Elsevier Patient Education  2024 Elsevier Inc. Monitored Anesthesia Care, Care After The following information offers guidance on how to care for yourself after your procedure. Your health care provider may also give you more specific instructions. If you have problems or questions, contact your health care provider. What can I expect after the procedure? After the procedure, it is common to have: Tiredness. Little or no memory about what happened during or after the procedure. Impaired judgment when it comes to making decisions. Nausea or vomiting. Some trouble with balance. Follow these instructions at home: For the time  period you were told by your health care provider:  Rest. Do not participate in activities where you could fall or become injured. Do not drive or use machinery. Do not drink alcohol. Do not take sleeping pills or medicines that cause drowsiness. Do not make important decisions or sign legal documents. Do not take care of children on your own. Medicines Take over-the-counter and prescription medicines only as told by your health care provider. If you were prescribed antibiotics, take them as told by your health care provider. Do not stop using the antibiotic even if you start to feel better. Eating and drinking Follow instructions from your health care provider about what you may eat and drink. Drink enough fluid to keep your urine pale yellow. If you vomit: Drink clear fluids slowly and in small amounts as  you are able. Clear fluids include water, ice chips, low-calorie sports drinks, and fruit juice that has water added to it (diluted fruit juice). Eat light and bland foods in small amounts as you are able. These foods include bananas, applesauce, rice, lean meats, toast, and crackers. General instructions  Have a responsible adult stay with you for the time you are told. It is important to have someone help care for you until you are awake and alert. If you have sleep apnea, surgery and some medicines can increase your risk for breathing problems. Follow instructions from your health care provider about wearing your sleep device: When you are sleeping. This includes during daytime naps. While taking prescription pain medicines, sleeping medicines, or medicines that make you drowsy. Do not use any products that contain nicotine or tobacco. These products include cigarettes, chewing tobacco, and vaping devices, such as e-cigarettes. If you need help quitting, ask your health care provider. Contact a health care provider if: You feel nauseous or vomit every time you eat or drink. You feel  light-headed. You are still sleepy or having trouble with balance after 24 hours. You get a rash. You have a fever. You have redness or swelling around the IV site. Get help right away if: You have trouble breathing. You have new confusion after you get home. These symptoms may be an emergency. Get help right away. Call 911. Do not wait to see if the symptoms will go away. Do not drive yourself to the hospital. This information is not intended to replace advice given to you by your health care provider. Make sure you discuss any questions you have with your health care provider. Document Revised: 07/12/2021 Document Reviewed: 07/12/2021 Elsevier Patient Education  2024 [employer].

## 2022-09-13 ENCOUNTER — Encounter (HOSPITAL_COMMUNITY): Payer: Self-pay

## 2022-09-13 ENCOUNTER — Encounter (HOSPITAL_COMMUNITY)
Admission: RE | Admit: 2022-09-13 | Discharge: 2022-09-13 | Disposition: A | Discharge status: Home / Self Care | Payer: 59 | Source: Ambulatory Visit | Attending: Gastroenterology | Admitting: Gastroenterology

## 2022-09-13 VITALS — BP 148/73 | HR 65 | Temp 97.8°F | Resp 18 | Ht 63.0 in | Wt 204.4 lb

## 2022-09-13 DIAGNOSIS — R9431 Abnormal electrocardiogram [ECG] [EKG]: Secondary | ICD-10-CM | POA: Diagnosis not present

## 2022-09-13 DIAGNOSIS — I10 Essential (primary) hypertension: Secondary | ICD-10-CM | POA: Insufficient documentation

## 2022-09-13 DIAGNOSIS — Z01810 Encounter for preprocedural cardiovascular examination: Secondary | ICD-10-CM | POA: Diagnosis not present

## 2022-09-15 ENCOUNTER — Encounter (HOSPITAL_COMMUNITY): Payer: Self-pay | Admitting: Anesthesiology

## 2022-09-16 DIAGNOSIS — R195 Other fecal abnormalities: Secondary | ICD-10-CM

## 2022-09-19 ENCOUNTER — Telehealth (INDEPENDENT_AMBULATORY_CARE_PROVIDER_SITE_OTHER): Payer: Self-pay | Admitting: Gastroenterology

## 2022-09-19 ENCOUNTER — Ambulatory Visit (HOSPITAL_COMMUNITY)
Admission: RE | Admit: 2022-09-19 | Discharge: 2022-09-19 | Disposition: A | Discharge status: Home / Self Care | Payer: 59 | Source: Ambulatory Visit | Attending: Nurse Practitioner | Admitting: Nurse Practitioner

## 2022-09-19 DIAGNOSIS — Z1231 Encounter for screening mammogram for malignant neoplasm of breast: Secondary | ICD-10-CM | POA: Diagnosis not present

## 2022-09-19 NOTE — Telephone Encounter (Signed)
Left message for pt to return call to reschedule TCS

## 2022-09-20 ENCOUNTER — Other Ambulatory Visit (INDEPENDENT_AMBULATORY_CARE_PROVIDER_SITE_OTHER): Payer: Self-pay | Admitting: Gastroenterology

## 2022-09-20 MED ORDER — CLENPIQ 10-3.5-12 MG-GM -GM/175ML PO SOLN: 1.0000 | ORAL | 0 refills | Status: DC

## 2022-09-20 NOTE — Telephone Encounter (Signed)
[facility]

## 2022-09-20 NOTE — Telephone Encounter (Signed)
Pt returned call. Pt has been rescheduled to 10/25/22. Pt prefers different prep. Sent clenpiq in and mailed instructions.

## 2022-09-26 ENCOUNTER — Ambulatory Visit: Payer: 59 | Admitting: Nurse Practitioner

## 2022-09-27 ENCOUNTER — Encounter (INDEPENDENT_AMBULATORY_CARE_PROVIDER_SITE_OTHER): Payer: Self-pay

## 2022-09-27 ENCOUNTER — Other Ambulatory Visit: Payer: Self-pay | Admitting: Nurse Practitioner

## 2022-09-27 DIAGNOSIS — E785 Hyperlipidemia, unspecified: Secondary | ICD-10-CM

## 2022-09-27 DIAGNOSIS — E039 Hypothyroidism, unspecified: Secondary | ICD-10-CM

## 2022-10-04 ENCOUNTER — Ambulatory Visit (INDEPENDENT_AMBULATORY_CARE_PROVIDER_SITE_OTHER): Payer: 59 | Admitting: Nurse Practitioner

## 2022-10-04 ENCOUNTER — Encounter: Payer: Self-pay | Admitting: Nurse Practitioner

## 2022-10-04 VITALS — BP 134/71 | HR 70 | Temp 97.3°F | Ht 63.0 in | Wt 206.2 lb

## 2022-10-04 DIAGNOSIS — E785 Hyperlipidemia, unspecified: Secondary | ICD-10-CM

## 2022-10-04 DIAGNOSIS — E039 Hypothyroidism, unspecified: Secondary | ICD-10-CM

## 2022-10-04 DIAGNOSIS — I739 Peripheral vascular disease, unspecified: Secondary | ICD-10-CM | POA: Diagnosis not present

## 2022-10-04 DIAGNOSIS — Z789 Other specified health status: Secondary | ICD-10-CM | POA: Insufficient documentation

## 2022-10-04 DIAGNOSIS — Z6832 Body mass index (BMI) 32.0-32.9, adult: Secondary | ICD-10-CM

## 2022-10-04 DIAGNOSIS — R600 Localized edema: Secondary | ICD-10-CM | POA: Diagnosis not present

## 2022-10-04 DIAGNOSIS — I10 Essential (primary) hypertension: Secondary | ICD-10-CM

## 2022-10-04 DIAGNOSIS — E559 Vitamin D deficiency, unspecified: Secondary | ICD-10-CM | POA: Diagnosis not present

## 2022-10-04 DIAGNOSIS — E119 Type 2 diabetes mellitus without complications: Secondary | ICD-10-CM

## 2022-10-04 DIAGNOSIS — M8588 Other specified disorders of bone density and structure, other site: Secondary | ICD-10-CM

## 2022-10-04 DIAGNOSIS — E876 Hypokalemia: Secondary | ICD-10-CM | POA: Diagnosis not present

## 2022-10-04 DIAGNOSIS — E1169 Type 2 diabetes mellitus with other specified complication: Secondary | ICD-10-CM | POA: Diagnosis not present

## 2022-10-04 LAB — LIPID PANEL
Chol/HDL Ratio: 4.9 ratio — ABNORMAL HIGH (ref 0.0–4.4)
Cholesterol, Total: 214 mg/dL — ABNORMAL HIGH (ref 100–199)
HDL: 44 mg/dL (ref >39)
LDL Chol Calc (NIH): 131 mg/dL — ABNORMAL HIGH (ref 0–99)
Triglycerides: 221 mg/dL — ABNORMAL HIGH (ref 0–149)
VLDL Cholesterol Cal: 39 mg/dL (ref 5–40)

## 2022-10-04 LAB — CMP14+EGFR
ALT: 20 IU/L (ref 0–32)
AST: 20 IU/L (ref 0–40)
Albumin: 4.4 g/dL (ref 3.8–4.8)
Alkaline Phosphatase: 75 IU/L (ref 44–121)
BUN/Creatinine Ratio: 19 (ref 12–28)
BUN: 15 mg/dL (ref 8–27)
Bilirubin Total: 0.8 mg/dL (ref 0.0–1.2)
CO2: 26 mmol/L (ref 20–29)
Calcium: 9.3 mg/dL (ref 8.7–10.3)
Chloride: 100 mmol/L (ref 96–106)
Creatinine, Ser: 0.8 mg/dL (ref 0.57–1.00)
Globulin, Total: 2.3 g/dL (ref 1.5–4.5)
Glucose: 113 mg/dL — ABNORMAL HIGH (ref 70–99)
Potassium: 4 mmol/L (ref 3.5–5.2)
Sodium: 142 mmol/L (ref 134–144)
Total Protein: 6.7 g/dL (ref 6.0–8.5)
eGFR: 78 mL/min/{1.73_m2} (ref >59)

## 2022-10-04 LAB — CBC WITH DIFFERENTIAL/PLATELET
Basophils Absolute: 0.1 10*3/uL (ref 0.0–0.2)
Basos: 1 %
EOS (ABSOLUTE): 0.3 10*3/uL (ref 0.0–0.4)
Eos: 5 %
Hematocrit: 39.5 % (ref 34.0–46.6)
Hemoglobin: 12.8 g/dL (ref 11.1–15.9)
Immature Grans (Abs): 0 10*3/uL (ref 0.0–0.1)
Immature Granulocytes: 0 %
Lymphocytes Absolute: 1.4 10*3/uL (ref 0.7–3.1)
Lymphs: 22 %
MCH: 29.1 pg (ref 26.6–33.0)
MCHC: 32.4 g/dL (ref 31.5–35.7)
MCV: 90 fL (ref 79–97)
Monocytes Absolute: 0.5 10*3/uL (ref 0.1–0.9)
Monocytes: 7 %
Neutrophils Absolute: 4.2 10*3/uL (ref 1.4–7.0)
Neutrophils: 65 %
Platelets: 265 10*3/uL (ref 150–450)
RBC: 4.4 x10E6/uL (ref 3.77–5.28)
RDW: 14.5 % (ref 11.7–15.4)
WBC: 6.4 10*3/uL (ref 3.4–10.8)

## 2022-10-04 LAB — BAYER DCA HB A1C WAIVED: HB A1C (BAYER DCA - WAIVED): 6.3 % — ABNORMAL HIGH (ref 4.8–5.6)

## 2022-10-04 MED ORDER — OLMESARTAN MEDOXOMIL-HCTZ 40-25 MG PO TABS
1.0000 | ORAL_TABLET | Freq: Every day | ORAL | 1 refills | Status: DC
Start: 2022-10-04 — End: 2023-04-06

## 2022-10-04 MED ORDER — FUROSEMIDE 20 MG PO TABS: 20.0000 mg | ORAL_TABLET | Freq: Every day | ORAL | 3 refills | Status: DC

## 2022-10-04 MED ORDER — LEVOTHYROXINE SODIUM 50 MCG PO TABS
50.0000 ug | ORAL_TABLET | Freq: Every day | ORAL | 0 refills | Status: DC
Start: 2022-10-04 — End: 2023-03-31

## 2022-10-04 NOTE — Progress Notes (Signed)
Subjective:    Patient ID: Mckenzie Leonard, female    DOB: [birth year], 72 y.o.   MRN: [id number]   Chief Complaint: medical management of chronic issues     HPI:  Mckenzie Leonard is a 72 y.o. who identifies as a female who was assigned female at birth.   Social history: Lives with: by herself- family checks on her frequently Work history: retired   Water engineer in today for follow up of the following chronic medical issues:  1. Primary hypertension No c/o chest pain, sob or headache. Does not check blood pressure at home. BP Readings from Last 3 Encounters:  09/13/22 (!) 148/73  08/08/22 133/73  05/13/22 116/70     2. Hyperlipidemia associated with type 2 diabetes mellitus (HCC) Does not watch diet and does no dedicated exercise. She stopped taking statin due to myalgia. Lab Results  Component Value Date   CHOL 120 03/28/2022   HDL 41 03/28/2022   LDLCALC 53 03/28/2022   TRIG 153 (H) 03/28/2022   CHOLHDL 2.9 03/28/2022     3. PAD (peripheral artery disease) (HCC) Mainly in bil lower ext. Not bothering her currently  4. Hypothyroidism (acquired) No issues that she is aware of Lab Results  Component Value Date   TSH 2.380 03/28/2022     5. Diet-controlled diabetes mellitus (HCC) Fasting blood sugars are running around 120-140. No low blood sugars Lab Results  Component Value Date   HGBA1C 6.3 (H) 03/28/2022     6. Hypokalemia No c/o muscle cramping Lab Results  Component Value Date   K 3.8 03/28/2022     7. Peripheral edema Has daily lower ext edema. Resolves mostly at night.  8. Vitamin D deficiency Daily vitamin d supplement  9. Osteopenia of lumbar spine Last dexscan as done on 03/28/22. T score was -1.0  10. BMI 32.0-32.9,adult Weight is unchanged Wt Readings from Last 3 Encounters:  10/04/22 206 lb 3.2 oz (93.5 kg)  09/13/22 204 lb 5.9 oz (92.7 kg)  08/08/22 204 lb 6.4 oz (92.7 kg)   BMI Readings from Last 3 Encounters:  10/04/22 36.53  kg/m  09/13/22 36.20 kg/m  08/08/22 36.21 kg/m     New complaints: None today  Allergies  Allergen Reactions   Sulfa Antibiotics Anaphylaxis   Statins Other (See Comments)    One of them caused muscle pain   Tetanus Toxoids     Reports flu like symptoms and swelling at injection site only   Outpatient Encounter Medications as of 10/04/2022  Medication Sig   aspirin EC 81 MG tablet Take 81 mg by mouth daily. Swallow whole.   atorvastatin (LIPITOR) 40 MG tablet TAKE 1 TABLET DAILY   BIOTIN PO Take 1 tablet by mouth daily.   Blood Glucose Monitoring Suppl (BLOOD GLUCOSE METER KIT AND SUPPLIES) KIT Dispense based on patient and insurance preference. Use to check BG once daily.  ICD 10 code = E11.9   glucose blood (ONETOUCH ULTRA) test strip CHECK BLOOD SUGAR DAILY AS DIRECTED   levothyroxine (SYNTHROID) 50 MCG tablet TAKE 1 TABLET DAILY   Misc Natural Products (JOINT HEALTH PO) Take 1 tablet by mouth daily.   Multiple Vitamin (MULTIVITAMIN WITH MINERALS) TABS tablet Take 1 tablet by mouth daily.   olmesartan-hydrochlorothiazide (BENICAR HCT) 40-25 MG tablet Take 1 tablet by mouth daily.   Sod Picosulfate-Mag Ox-Cit Acd (CLENPIQ) 10-3.5-12 MG-GM -GM/175ML SOLN Take 1 kit by mouth as directed.   No facility-administered encounter medications on file as of 10/04/2022.  Past Surgical History:  Procedure Laterality Date   BREAST BIOPSY Right    negative   CARPAL TUNNEL RELEASE     Right   CLOSED REDUCTION MANDIBLE  02/20/2012   Procedure: CLOSED REDUCTION MANDIBULAR;  Surgeon: Francene Finders, DDS;  Location: [facility] OR;  Service: Oral Surgery;  Laterality: Right;  Closed reduction zygonach arch fx   EXCISION MELANOMA WITH SENTINEL LYMPH NODE BIOPSY Left 05/24/2021   Procedure: MELANOMA WIDE LOCAL EXCISION;  Surgeon: Berna Bue, MD;  Location: [facility] OR;  Service: General;  Laterality: Left;   JOINT REPLACEMENT     KNEE CARTILAGE SURGERY Left    ORIF FACIAL FRACTURE  02/20/2012    Procedure: OPEN REDUCTION INTERNAL FIXATION (ORIF) MULTIPLE FACIAL FRACTURES;  Surgeon: Francene Finders, DDS;  Location: [facility] OR;  Service: Oral Surgery;  Laterality: Right;  ORIF Rt [facility] (zygomaxillary)   SENTINEL NODE BIOPSY Left 05/24/2021   Procedure: SENTINEL NODE BIOPSY;  Surgeon: Berna Bue, MD;  Location: [facility] OR;  Service: General;  Laterality: Left;   TOTAL KNEE ARTHROPLASTY Left 10/12/2016   Procedure: TOTAL KNEE ARTHROPLASTY;  Surgeon: Vickki Hearing, MD;  Location: [facility] ORS;  Service: Orthopedics;  Laterality: Left;    Family History  Problem Relation Age of Onset   Arthritis Mother    Heart disease Mother        enlarged heart   Anemia Mother    Stroke Mother    Hearing loss Mother    Arthritis Father    Diabetes Father    Hypertension Father    Cancer Sister        breast   Diabetes Brother    CAD Brother    Healthy Daughter    Healthy Daughter       Controlled substance contract: n/a      Review of Systems  Constitutional:  Negative for diaphoresis.  Eyes:  Negative for pain.  Respiratory:  Negative for shortness of breath.   Cardiovascular:  Negative for chest pain, palpitations and leg swelling.  Gastrointestinal:  Negative for abdominal pain.  Endocrine: Negative for polydipsia.  Skin:  Negative for rash.  Neurological:  Negative for dizziness, weakness and headaches.  Hematological:  Does not bruise/bleed easily.  All other systems reviewed and are negative.      Objective:   Physical Exam Vitals and nursing note reviewed.  Constitutional:      General: She is not in acute distress.    Appearance: Normal appearance. She is well-developed.  HENT:     Head: Normocephalic.     Right Ear: Tympanic membrane normal.     Left Ear: Tympanic membrane normal.     Nose: Nose normal.     Mouth/Throat:     Mouth: Mucous membranes are moist.  Eyes:     Pupils: Pupils are equal, round, and reactive to light.  Neck:     Vascular: No  carotid bruit or JVD.  Cardiovascular:     Rate and Rhythm: Normal rate and regular rhythm.     Heart sounds: Normal heart sounds.  Pulmonary:     Effort: Pulmonary effort is normal. No respiratory distress.     Breath sounds: Normal breath sounds. No wheezing or rales.  Chest:     Chest wall: No tenderness.  Abdominal:     General: Bowel sounds are normal. There is no distension or abdominal bruit.     Palpations: Abdomen is soft. There is no hepatomegaly, splenomegaly, mass or pulsatile mass.  Tenderness: There is no abdominal tenderness.  Musculoskeletal:        General: Normal range of motion.     Cervical back: Normal range of motion and neck supple.     Right lower leg: Edema (2+) present.     Left lower leg: Edema (2+) present.  Lymphadenopathy:     Cervical: No cervical adenopathy.  Skin:    General: Skin is warm and dry.  Neurological:     Mental Status: She is alert and oriented to person, place, and time.     Deep Tendon Reflexes: Reflexes are normal and symmetric.  Psychiatric:        Behavior: Behavior normal.        Thought Content: Thought content normal.        Judgment: Judgment normal.    BP 134/71   Pulse 70   Temp (!) 97.3 F (36.3 C) (Temporal)   Ht 5\' 3"  (1.6 m)   Wt 206 lb 3.2 oz (93.5 kg)   SpO2 96%   BMI 36.53 kg/m   HGBA1c 6.3%       Assessment & Plan:  Mckenzie Leonard comes in today with chief complaint of No chief complaint on file.   Diagnosis and orders addressed:  1. Primary hypertension Low sodium diet - CBC with Differential/Platelet - CMP14+EGFR - olmesartan-hydrochlorothiazide (BENICAR HCT) 40-25 MG tablet; Take 1 tablet by mouth daily.  Dispense: 90 tablet; Refill: 1  2. Hyperlipidemia associated with type 2 diabetes mellitus (HCC) Low fat diet - Lipid panel  3. Statin intolerance   4. PAD (peripheral artery disease) (HCC)   5. Hypothyroidism (acquired) Labs pending - levothyroxine (SYNTHROID) 50 MCG tablet;  Take 1 tablet (50 mcg total) by mouth daily.  Dispense: 90 tablet; Refill: 0  6. Diet-controlled diabetes mellitus (HCC) Continue to watch carbs in diet - Bayer DCA Hb A1c Waived - Microalbumin / creatinine urine ratio  7. Hypokalemia Labs pending  8. Peripheral edema Elevate legs when sitting Compression socks Added lasix today - furosemide (LASIX) 20 MG tablet; Take 1 tablet (20 mg total) by mouth daily.  Dispense: 30 tablet; Refill: 3  9. Vitamin D deficiency Continue vitamin d supplement  10. Osteopenia of lumbar spine Weight bearing exercises encouraged  11. BMI 32.0-32.9,adult Discussed diet and exercise for person with BMI >25 Will recheck weight in 3-6 months    Labs pending Health Maintenance reviewed Diet and exercise encouraged  Follow up plan: 6 months   Mary-Margaret Daphine Deutscher, FNP

## 2022-10-05 LAB — THYROID PANEL WITH TSH

## 2022-10-05 LAB — SPECIMEN STATUS REPORT

## 2022-10-07 ENCOUNTER — Ambulatory Visit (INDEPENDENT_AMBULATORY_CARE_PROVIDER_SITE_OTHER): Payer: 59

## 2022-10-07 VITALS — Ht 63.0 in | Wt 203.0 lb

## 2022-10-07 DIAGNOSIS — Z0000 Encounter for general adult medical examination without abnormal findings: Secondary | ICD-10-CM

## 2022-10-07 NOTE — Patient Instructions (Signed)
Mckenzie Leonard , Thank you for taking time to come for your Medicare Wellness Visit. I appreciate your ongoing commitment to your health goals. Please review the following plan we discussed and let me know if I can assist you in the future.   Referrals/Orders/Follow-Ups/Clinician Recommendations: Aim for 30 minutes of exercise or brisk walking, 6-8 glasses of water, and 5 servings of fruits and vegetables each day.   This is a list of the screening recommended for you and due dates:  Health Maintenance  Topic Date Due   Eye exam for diabetics  05/05/2021   DTaP/Tdap/Td vaccine (2 - Td or Tdap) 02/17/2022   Complete foot exam   03/29/2022   Yearly kidney health urinalysis for diabetes  09/25/2022   COVID-19 Vaccine (1) 10/20/2022*   Zoster (Shingles) Vaccine (1 of 2) 01/04/2023*   Stool Blood Test  04/06/2023*   Pneumonia Vaccine (1 of 1 - PCV) 05/05/2023*   Flu Shot  05/29/2023*   Hemoglobin A1C  04/06/2023   Yearly kidney function blood test for diabetes  10/04/2023   Medicare Annual Wellness Visit  10/07/2023   DEXA scan (bone density measurement)  03/28/2024   Mammogram  09/18/2024   Hepatitis C Screening  Completed   HPV Vaccine  Aged Out   Colon Cancer Screening  Discontinued  *Topic was postponed. The date shown is not the original due date.    Advanced directives: (ACP Link)Information on Advanced Care Planning can be found at [facility] Advance Health Care Directives Advance Health Care Directives ([email or web address] Information on Advanced Care Planning can be found at [facility] Advance Health Care Directives Advance Health Care Directives ([email or web address]    Next Medicare Annual Wellness Visit scheduled for next year: Yes  Preventive Care 65 Years and Older, Female Preventive care refers to lifestyle choices and visits with your health care provider that can promote health and wellness. What does preventive care include? A yearly physical  exam. This is also called an annual well check. Dental exams once or twice a year. Routine eye exams. Ask your health care provider how often you should have your eyes checked. Personal lifestyle choices, including: Daily care of your teeth and gums. Regular physical activity. Eating a healthy diet. Avoiding tobacco and drug use. Limiting alcohol use. Practicing safe sex. Taking low-dose aspirin every day. Taking vitamin and mineral supplements as recommended by your health care provider. What happens during an annual well check? The services and screenings done by your health care provider during your annual well check will depend on your age, overall health, lifestyle risk factors, and family history of disease. Counseling  Your health care provider may ask you questions about your: Alcohol use. Tobacco use. Drug use. Emotional well-being. Home and relationship well-being. Sexual activity. Eating habits. History of falls. Memory and ability to understand (cognition). Work and work Astronomer. Reproductive health. Screening  You may have the following tests or measurements: Height, weight, and BMI. Blood pressure. Lipid and cholesterol levels. These may be checked every 5 years, or more frequently if you are over 41 years old. Skin check. Lung cancer screening. You may have this screening every year starting at age 3 if you have a 30-pack-year history of smoking and currently smoke or have quit within the past 15 years. Fecal occult blood test (FOBT) of the stool. You may have this test every year starting at age 67. Flexible sigmoidoscopy or colonoscopy. You may have a sigmoidoscopy every 5 years  or a colonoscopy every 10 years starting at age 28. Hepatitis C blood test. Hepatitis B blood test. Sexually transmitted disease (STD) testing. Diabetes screening. This is done by checking your blood sugar (glucose) after you have not eaten for a while (fasting). You may have this  done every 1-3 years. Bone density scan. This is done to screen for osteoporosis. You may have this done starting at age 79. Mammogram. This may be done every 1-2 years. Talk to your health care provider about how often you should have regular mammograms. Talk with your health care provider about your test results, treatment options, and if necessary, the need for more tests. Vaccines  Your health care provider may recommend certain vaccines, such as: Influenza vaccine. This is recommended every year. Tetanus, diphtheria, and acellular pertussis (Tdap, Td) vaccine. You may need a Td booster every 10 years. Zoster vaccine. You may need this after age 40. Pneumococcal 13-valent conjugate (PCV13) vaccine. One dose is recommended after age 30. Pneumococcal polysaccharide (PPSV23) vaccine. One dose is recommended after age 25. Talk to your health care provider about which screenings and vaccines you need and how often you need them. This information is not intended to replace advice given to you by your health care provider. Make sure you discuss any questions you have with your health care provider. Document Released: 03/13/2015 Document Revised: 11/04/2015 Document Reviewed: 12/16/2014 Elsevier Interactive Patient Education  2017 [employer].  Fall Prevention in the Home Falls can cause injuries. They can happen to people of all ages. There are many things you can do to make your home safe and to help prevent falls. What can I do on the outside of my home? Regularly fix the edges of walkways and driveways and fix any cracks. Remove anything that might make you trip as you walk through a door, such as a raised step or threshold. Trim any bushes or trees on the path to your home. Use bright outdoor lighting. Clear any walking paths of anything that might make someone trip, such as rocks or tools. Regularly check to see if handrails are loose or broken. Make sure that both sides of any steps have  handrails. Any raised decks and porches should have guardrails on the edges. Have any leaves, snow, or ice cleared regularly. Use sand or salt on walking paths during winter. Clean up any spills in your garage right away. This includes oil or grease spills. What can I do in the bathroom? Use night lights. Install grab bars by the toilet and in the tub and shower. Do not use towel bars as grab bars. Use non-skid mats or decals in the tub or shower. If you need to sit down in the shower, use a plastic, non-slip stool. Keep the floor dry. Clean up any water that spills on the floor as soon as it happens. Remove soap buildup in the tub or shower regularly. Attach bath mats securely with double-sided non-slip rug tape. Do not have throw rugs and other things on the floor that can make you trip. What can I do in the bedroom? Use night lights. Make sure that you have a light by your bed that is easy to reach. Do not use any sheets or blankets that are too big for your bed. They should not hang down onto the floor. Have a firm chair that has side arms. You can use this for support while you get dressed. Do not have throw rugs and other things on the floor  that can make you trip. What can I do in the kitchen? Clean up any spills right away. Avoid walking on wet floors. Keep items that you use a lot in easy-to-reach places. If you need to reach something above you, use a strong step stool that has a grab bar. Keep electrical cords out of the way. Do not use floor polish or wax that makes floors slippery. If you must use wax, use non-skid floor wax. Do not have throw rugs and other things on the floor that can make you trip. What can I do with my stairs? Do not leave any items on the stairs. Make sure that there are handrails on both sides of the stairs and use them. Fix handrails that are broken or loose. Make sure that handrails are as long as the stairways. Check any carpeting to make sure  that it is firmly attached to the stairs. Fix any carpet that is loose or worn. Avoid having throw rugs at the top or bottom of the stairs. If you do have throw rugs, attach them to the floor with carpet tape. Make sure that you have a light switch at the top of the stairs and the bottom of the stairs. If you do not have them, ask someone to add them for you. What else can I do to help prevent falls? Wear shoes that: Do not have high heels. Have rubber bottoms. Are comfortable and fit you well. Are closed at the toe. Do not wear sandals. If you use a stepladder: Make sure that it is fully opened. Do not climb a closed stepladder. Make sure that both sides of the stepladder are locked into place. Ask someone to hold it for you, if possible. Clearly mark and make sure that you can see: Any grab bars or handrails. First and last steps. Where the edge of each step is. Use tools that help you move around (mobility aids) if they are needed. These include: Canes. Walkers. Scooters. Crutches. Turn on the lights when you go into a dark area. Replace any light bulbs as soon as they burn out. Set up your furniture so you have a clear path. Avoid moving your furniture around. If any of your floors are uneven, fix them. If there are any pets around you, be aware of where they are. Review your medicines with your doctor. Some medicines can make you feel dizzy. This can increase your chance of falling. Ask your doctor what other things that you can do to help prevent falls. This information is not intended to replace advice given to you by your health care provider. Make sure you discuss any questions you have with your health care provider. Document Released: 12/11/2008 Document Revised: 07/23/2015 Document Reviewed: 03/21/2014 Elsevier Interactive Patient Education  2017 [employer].

## 2022-10-07 NOTE — Progress Notes (Signed)
Subjective:   Mckenzie Leonard is a 72 y.o. female who presents for Medicare Annual (Subsequent) preventive examination.  Visit Complete: Virtual  I connected with  [name] on 10/07/22 by a audio enabled telemedicine application and verified that I am speaking with the correct person using two identifiers.  Patient Location: Home  Provider Location: Home Office  I discussed the limitations of evaluation and management by telemedicine. The patient expressed understanding and agreed to proceed.  Patient Medicare AWV questionnaire was completed by the patient on 10/07/2022; I have confirmed that all information answered by patient is correct and no changes since this date.  Review of Systems    Vital Signs: Unable to obtain new vitals due to this being a telehealth visit.  Cardiac Risk Factors include: advanced age (>97men, >5 women);diabetes mellitus;dyslipidemia;hypertensionNutrition Risk Assessment:  Has the patient had any N/V/D within the last 2 months?  No  Does the patient have any non-healing wounds?  No  Has the patient had any unintentional weight loss or weight gain?  No   Diabetes:  Is the patient diabetic?  Yes  If diabetic, was a CBG obtained today?  No  Did the patient bring in their glucometer from home?  No  How often do you monitor your CBG's? Daily .   Financial Strains and Diabetes Management:  Are you having any financial strains with the device, your supplies or your medication? No .  Does the patient want to be seen by Chronic Care Management for management of their diabetes?  No  Would the patient like to be referred to a Nutritionist or for Diabetic Management?  No   Diabetic Exams:  Diabetic Eye Exam: Completed 08/2022 Diabetic Foot Exam: Overdue, Pt has been advised about the importance in completing this exam. Pt is scheduled for diabetic foot exam on next office visit .      Objective:    Today's Vitals   10/07/22 0813  Weight: 203 lb  (92.1 kg)  Height: 5\' 3"  (1.6 m)   Body mass index is 35.96 kg/m.     10/07/2022    8:17 AM 09/13/2022   11:29 AM 10/05/2021    9:57 AM 05/14/2021   10:50 AM 10/02/2020   10:12 AM 10/02/2019    9:44 AM 09/03/2018    9:58 AM  Advanced Directives  Does Patient Have a Medical Advance Directive? No No No No No No No  Would patient like information on creating a medical advance directive? Yes (MAU/Ambulatory/Procedural Areas - Information given) No - Patient declined No - Patient declined No - Patient declined No - Patient declined No - Patient declined No - Patient declined    Current Medications (verified) Outpatient Encounter Medications as of 10/07/2022  Medication Sig   Ascorbic Acid (VITAMIN C PO) Take 2,000 mg by mouth daily.   aspirin EC 81 MG tablet Take 81 mg by mouth daily. Swallow whole.   BIOTIN PO Take 1 tablet by mouth daily.   Blood Glucose Monitoring Suppl (BLOOD GLUCOSE METER KIT AND SUPPLIES) KIT Dispense based on patient and insurance preference. Use to check BG once daily.  ICD 10 code = E11.9   Calcium Citrate-Vitamin D (CALCIUM + D PO) Take 1 tablet by mouth daily.   Cholecalciferol (VITAMIN D3 ULTRA STRENGTH) 125 MCG (5000 UT) capsule Take 5,000 Units by mouth daily.   furosemide (LASIX) 20 MG tablet Take 1 tablet (20 mg total) by mouth daily.   glucose blood (ONETOUCH ULTRA) test  strip CHECK BLOOD SUGAR DAILY AS DIRECTED   levothyroxine (SYNTHROID) 50 MCG tablet Take 1 tablet (50 mcg total) by mouth daily.   Magnesium 400 MG TABS Take 400 mg by mouth daily.   Misc Natural Products (JOINT HEALTH PO) Take 1 tablet by mouth daily.   Multiple Vitamin (MULTIVITAMIN WITH MINERALS) TABS tablet Take 1 tablet by mouth daily.   naproxen sodium (ALEVE) 220 MG tablet Take 220 mg by mouth daily.   olmesartan-hydrochlorothiazide (BENICAR HCT) 40-25 MG tablet Take 1 tablet by mouth daily.   Zinc 50 MG TABS Take 50 mg by mouth daily.   No facility-administered encounter medications on  file as of 10/07/2022.    Allergies (verified) Sulfa antibiotics, Statins, and Tetanus toxoids   History: Past Medical History:  Diagnosis Date   Arthritis    Diabetes mellitus without complication (HCC)    High cholesterol    Hypertension    Hypothyroidism    PONV (postoperative nausea and vomiting)    Thyroid disease    Past Surgical History:  Procedure Laterality Date   BREAST BIOPSY Right    negative   CARPAL TUNNEL RELEASE     Right   CLOSED REDUCTION MANDIBLE  02/20/2012   Procedure: CLOSED REDUCTION MANDIBULAR;  Surgeon: Francene Finders, DDS;  Location: [facility] OR;  Service: Oral Surgery;  Laterality: Right;  Closed reduction zygonach arch fx   EXCISION MELANOMA WITH SENTINEL LYMPH NODE BIOPSY Left 05/24/2021   Procedure: MELANOMA WIDE LOCAL EXCISION;  Surgeon: Berna Bue, MD;  Location: [facility] OR;  Service: General;  Laterality: Left;   JOINT REPLACEMENT     KNEE CARTILAGE SURGERY Left    ORIF FACIAL FRACTURE  02/20/2012   Procedure: OPEN REDUCTION INTERNAL FIXATION (ORIF) MULTIPLE FACIAL FRACTURES;  Surgeon: Francene Finders, DDS;  Location: [facility] OR;  Service: Oral Surgery;  Laterality: Right;  ORIF Rt [facility] (zygomaxillary)   SENTINEL NODE BIOPSY Left 05/24/2021   Procedure: SENTINEL NODE BIOPSY;  Surgeon: Berna Bue, MD;  Location: [facility] OR;  Service: General;  Laterality: Left;   TOTAL KNEE ARTHROPLASTY Left 10/12/2016   Procedure: TOTAL KNEE ARTHROPLASTY;  Surgeon: Vickki Hearing, MD;  Location: [facility] OR[facility];  Service: Orthopedics;  Laterality: Left;   Family History  Problem Relation Age of Onset   Arthritis Mother    Heart disease Mother        enlarged heart   Anemia Mother    Stroke Mother    Hearing loss Mother    Arthritis Father    Diabetes Father    Hypertension Father    Cancer Sister        breast   Diabetes Brother    CAD Brother    Healthy Daughter    Healthy Daughter    Social History   Socioeconomic History   Marital status:  Divorced    Spouse name: Not on file   Number of children: 2   Years of education: 10   Highest education level: GED or equivalent  Occupational History   Occupation: retired    Comment: Designer, fashion/clothing  Tobacco Use   Smoking status: Never   Smokeless tobacco: Never  Vaping Use   Vaping status: Never Used  Substance and Sexual Activity   Alcohol use: No   Drug use: No   Sexual activity: Yes    Birth control/protection: Post-menopausal  Other Topics Concern   Not on file  Social History Narrative   Spends lots of time with her daughters and  sister, goes to church and senior center frequently; stays on the go   Social Determinants of Health   Financial Resource Strain: Low Risk  (10/07/2022)   Overall Financial Resource Strain (CARDIA)    Difficulty of Paying Living Expenses: Not hard at all  Food Insecurity: No Food Insecurity (10/07/2022)   Hunger Vital Sign    Worried About Running Out of Food in the Last Year: Never true    Ran Out of Food in the Last Year: Never true  Transportation Needs: No Transportation Needs (10/07/2022)   PRAPARE - Administrator, Civil Service (Medical): No    Lack of Transportation (Non-Medical): No  Physical Activity: Insufficiently Active (10/07/2022)   Exercise Vital Sign    Days of Exercise per Week: 2 days    Minutes of Exercise per Session: 30 min  Stress: No Stress Concern Present (10/07/2022)   [employer] of Occupational Health - Occupational Stress Questionnaire    Feeling of Stress : Not at all  Social Connections: Moderately Isolated (10/07/2022)   Social Connection and Isolation Panel [NHANES]    Frequency of Communication with Friends and Family: More than three times a week    Frequency of Social Gatherings with Friends and Family: More than three times a week    Attends Religious Services: More than 4 times per year    Active Member of [employer] or Organizations: No    Attends Engineer, structural: Never    Marital  Status: Divorced    Tobacco Counseling Counseling given: Not Answered   Clinical Intake:  Pre-visit preparation completed: Yes  Pain : No/denies pain     Nutritional Risks: None Diabetes: No  How often do you need to have someone help you when you read instructions, pamphlets, or other written materials from your doctor or pharmacy?: 1 - Never  Interpreter Needed?: No  Information entered by :: Renie Ora, LPN   Activities of Daily Living    10/07/2022    8:17 AM 09/13/2022   11:30 AM  In your present state of health, do you have any difficulty performing the following activities:  Hearing? 0   Vision? 0   Difficulty concentrating or making decisions? 0   Walking or climbing stairs? 0   Dressing or bathing? 0   Doing errands, shopping? 0 0  Preparing Food and eating ? N   Using the Toilet? N   In the past six months, have you accidently leaked urine? N   Do you have problems with loss of bowel control? N   Managing your Medications? N   Managing your Finances? N   Housekeeping or managing your Housekeeping? N     Patient Care Team: Bennie Pierini, FNP as PCP - General (Nurse Practitioner) Vickki Hearing, MD as Consulting Physician (Orthopedic Surgery) Delora Fuel, OD (Optometry)  Indicate any recent Medical Services you may have received from other than [facility] providers in the past year (date may be approximate).     Assessment:   This is a routine wellness examination for [place].  Hearing/Vision screen Vision Screening - Comments:: Wears rx glasses - up to date with routine eye exams with  Dr.johnson   Dietary issues and exercise activities discussed:     Goals Addressed             This Visit's Progress    DIET - INCREASE WATER INTAKE         Depression Screen    10/07/2022  8:15 AM 10/04/2022    8:14 AM 05/05/2022    8:52 AM 05/05/2022    8:37 AM 03/28/2022    8:10 AM 10/05/2021    9:54 AM 09/24/2021   10:02 AM  PHQ 2/9 Scores   PHQ - 2 Score 0 0 0 0 0 0 0  PHQ- 9 Score 0 0 0 0 0 0 0    Fall Risk    10/07/2022    8:14 AM 10/04/2022    8:13 AM 05/05/2022    8:52 AM 05/05/2022    8:37 AM 03/28/2022    8:09 AM  Fall Risk   Falls in the past year? 0 0 0 0 0  Number falls in past yr: 0 0     Injury with Fall? 0 0     Risk for fall due to : No Fall Risks      Follow up Falls prevention discussed        MEDICARE RISK AT HOME:  Medicare Risk at Home - 10/07/22 0814     Any stairs in or around the home? No    If so, are there any without handrails? No    Home free of loose throw rugs in walkways, pet beds, electrical cords, etc? Yes    Adequate lighting in your home to reduce risk of falls? Yes    Life alert? No    Use of a cane, walker or w/c? No    Grab bars in the bathroom? Yes    Shower chair or bench in shower? Yes    Elevated toilet seat or a handicapped toilet? Yes             TIMED UP AND GO:  Was the test performed?  No    Cognitive Function:    08/30/2017    9:02 AM 08/29/2016    3:02 PM  MMSE - Mini Mental State Exam  Orientation to time 5 5  Orientation to Place 5 5  Registration 3 3  Attention/ Calculation 5 5  Recall 3 3  Language- name 2 objects 2 2  Language- repeat 1 1  Language- follow 3 step command 3 3  Language- read & follow direction 1 1  Write a sentence 1 1  Copy design 1 1  Total score 30 30        10/07/2022    8:17 AM 10/05/2021    9:55 AM 10/02/2019    9:45 AM 09/03/2018   10:00 AM  6CIT Screen  What Year? 0 points 0 points 0 points 0 points  What month? 0 points 0 points 0 points 0 points  What time? 0 points 0 points 0 points 0 points  Count back from 20 0 points 0 points 0 points 0 points  Months in reverse 0 points 0 points 0 points 0 points  Repeat phrase 0 points 0 points 0 points 0 points  Total Score 0 points 0 points 0 points 0 points    Immunizations Immunization History  Administered Date(s) Administered   Tdap 02/18/2012    TDAP status: Due,  Education has been provided regarding the importance of this vaccine. Advised may receive this vaccine at local pharmacy or Health Dept. Aware to provide a copy of the vaccination record if obtained from local pharmacy or Health Dept. Verbalized acceptance and understanding.  Flu Vaccine status: Declined, Education has been provided regarding the importance of this vaccine but patient still declined. Advised may receive this vaccine at local pharmacy  or Health Dept. Aware to provide a copy of the vaccination record if obtained from local pharmacy or Health Dept. Verbalized acceptance and understanding.  Pneumococcal vaccine status: Declined,  Education has been provided regarding the importance of this vaccine but patient still declined. Advised may receive this vaccine at local pharmacy or Health Dept. Aware to provide a copy of the vaccination record if obtained from local pharmacy or Health Dept. Verbalized acceptance and understanding.   Covid-19 vaccine status: Declined, Education has been provided regarding the importance of this vaccine but patient still declined. Advised may receive this vaccine at local pharmacy or Health Dept.or vaccine clinic. Aware to provide a copy of the vaccination record if obtained from local pharmacy or Health Dept. Verbalized acceptance and understanding.  Qualifies for Shingles Vaccine? Yes   Zostavax completed No   Shingrix Completed?: No.    Education has been provided regarding the importance of this vaccine. Patient has been advised to call insurance company to determine out of pocket expense if they have not yet received this vaccine. Advised may also receive vaccine at local pharmacy or Health Dept. Verbalized acceptance and understanding.  Screening Tests Health Maintenance  Topic Date Due   OPHTHALMOLOGY EXAM  05/05/2021   DTaP/Tdap/Td (2 - Td or Tdap) 02/17/2022   FOOT EXAM  03/29/2022   Diabetic kidney evaluation - Urine ACR  09/25/2022   COVID-19  Vaccine (1) 10/20/2022 (Originally 11/09/1955)   Zoster Vaccines- Shingrix (1 of 2) 01/04/2023 (Originally 11/10/1969)   COLON CANCER SCREENING ANNUAL FOBT  04/06/2023 (Originally 08/04/2020)   Pneumonia Vaccine 38+ Years old (1 of 1 - PCV) 05/05/2023 (Originally 11/09/2013)   INFLUENZA VACCINE  05/29/2023 (Originally 09/29/2022)   HEMOGLOBIN A1C  04/06/2023   Diabetic kidney evaluation - eGFR measurement  10/04/2023   Medicare Annual Wellness (AWV)  10/07/2023   DEXA SCAN  03/28/2024   MAMMOGRAM  09/18/2024   Hepatitis C Screening  Completed   HPV VACCINES  Aged Out   Colonoscopy  Discontinued    Health Maintenance  Health Maintenance Due  Topic Date Due   OPHTHALMOLOGY EXAM  05/05/2021   DTaP/Tdap/Td (2 - Td or Tdap) 02/17/2022   FOOT EXAM  03/29/2022   Diabetic kidney evaluation - Urine ACR  09/25/2022    Colorectal cancer screening: Type of screening: Colonoscopy. Completed scheduled 10/25/2022. Repeat every 5 years  Mammogram status: Completed 09/19/2022. Repeat every year  Bone Density status: Completed 03/28/2022. Results reflect: Bone density results: OSTEOPOROSIS. Repeat every 2 years.  Lung Cancer Screening: (Low Dose CT Chest recommended if Age 13-80 years, 20 pack-year currently smoking OR have quit w/in 15years.) does not qualify.   Lung Cancer Screening Referral: n/a  Additional Screening:  Hepatitis C Screening: does not qualify; Completed 05/22/2015  Vision Screening: Recommended annual ophthalmology exams for early detection of glaucoma and other disorders of the eye. Is the patient up to date with their annual eye exam?  Yes  Who is the provider or what is the name of the office in which the patient attends annual eye exams? Dr.johnson  If pt is not established with a provider, would they like to be referred to a provider to establish care? No .   Dental Screening: Recommended annual dental exams for proper oral hygiene    Community Resource Referral /  Chronic Care Management: CRR required this visit?  No   CCM required this visit?  No     Plan:     I have personally reviewed and  noted the following in the patient's chart:   Medical and social history Use of alcohol, tobacco or illicit drugs  Current medications and supplements including opioid prescriptions. Patient is not currently taking opioid prescriptions. Functional ability and status Nutritional status Physical activity Advanced directives List of other physicians Hospitalizations, surgeries, and ER visits in previous 12 months Vitals Screenings to include cognitive, depression, and falls Referrals and appointments  In addition, I have reviewed and discussed with patient certain preventive protocols, quality metrics, and best practice recommendations. A written personalized care plan for preventive services as well as general preventive health recommendations were provided to patient.     Lorrene Reid, LPN   11/04/2950   After Visit Summary: (MyChart) Due to this being a telephonic visit, the after visit summary with patients personalized plan was offered to patient via MyChart   Nurse Notes: none

## 2022-10-20 ENCOUNTER — Other Ambulatory Visit: Payer: Self-pay

## 2022-10-20 ENCOUNTER — Encounter (HOSPITAL_COMMUNITY)
Admission: RE | Admit: 2022-10-20 | Discharge: 2022-10-20 | Disposition: A | Discharge status: Home / Self Care | Payer: 59 | Source: Ambulatory Visit | Attending: Gastroenterology | Admitting: Gastroenterology

## 2022-10-20 ENCOUNTER — Encounter (HOSPITAL_COMMUNITY): Payer: Self-pay

## 2022-10-25 ENCOUNTER — Encounter (HOSPITAL_COMMUNITY)
Admission: RE | Disposition: A | Discharge status: Home / Self Care | Payer: Self-pay | Source: Home / Self Care | Attending: Gastroenterology

## 2022-10-25 ENCOUNTER — Ambulatory Visit (HOSPITAL_COMMUNITY): Payer: 59 | Admitting: Anesthesiology

## 2022-10-25 ENCOUNTER — Ambulatory Visit (HOSPITAL_BASED_OUTPATIENT_CLINIC_OR_DEPARTMENT_OTHER): Payer: 59 | Admitting: Anesthesiology

## 2022-10-25 ENCOUNTER — Ambulatory Visit (HOSPITAL_COMMUNITY)
Admission: RE | Admit: 2022-10-25 | Discharge: 2022-10-25 | Disposition: A | Discharge status: Home / Self Care | Payer: 59 | Attending: Gastroenterology | Admitting: Gastroenterology

## 2022-10-25 ENCOUNTER — Encounter (INDEPENDENT_AMBULATORY_CARE_PROVIDER_SITE_OTHER): Payer: Self-pay | Admitting: *Deleted

## 2022-10-25 ENCOUNTER — Encounter (HOSPITAL_COMMUNITY): Payer: Self-pay | Admitting: Gastroenterology

## 2022-10-25 DIAGNOSIS — K5730 Diverticulosis of large intestine without perforation or abscess without bleeding: Secondary | ICD-10-CM

## 2022-10-25 DIAGNOSIS — E039 Hypothyroidism, unspecified: Secondary | ICD-10-CM | POA: Insufficient documentation

## 2022-10-25 DIAGNOSIS — K644 Residual hemorrhoidal skin tags: Secondary | ICD-10-CM | POA: Diagnosis not present

## 2022-10-25 DIAGNOSIS — Z800 Family history of malignant neoplasm of digestive organs: Secondary | ICD-10-CM | POA: Diagnosis not present

## 2022-10-25 DIAGNOSIS — E119 Type 2 diabetes mellitus without complications: Secondary | ICD-10-CM | POA: Diagnosis not present

## 2022-10-25 DIAGNOSIS — Z1211 Encounter for screening for malignant neoplasm of colon: Secondary | ICD-10-CM | POA: Diagnosis not present

## 2022-10-25 DIAGNOSIS — M1990 Unspecified osteoarthritis, unspecified site: Secondary | ICD-10-CM | POA: Insufficient documentation

## 2022-10-25 DIAGNOSIS — D126 Benign neoplasm of colon, unspecified: Secondary | ICD-10-CM | POA: Diagnosis not present

## 2022-10-25 DIAGNOSIS — I10 Essential (primary) hypertension: Secondary | ICD-10-CM | POA: Diagnosis not present

## 2022-10-25 DIAGNOSIS — D123 Benign neoplasm of transverse colon: Secondary | ICD-10-CM

## 2022-10-25 DIAGNOSIS — R195 Other fecal abnormalities: Secondary | ICD-10-CM

## 2022-10-25 DIAGNOSIS — K635 Polyp of colon: Secondary | ICD-10-CM | POA: Diagnosis not present

## 2022-10-25 HISTORY — PX: POLYPECTOMY: SHX5525

## 2022-10-25 HISTORY — PX: COLONOSCOPY WITH PROPOFOL: SHX5780

## 2022-10-25 LAB — HM COLONOSCOPY

## 2022-10-25 LAB — GLUCOSE, CAPILLARY: Glucose-Capillary: 123 mg/dL — ABNORMAL HIGH (ref 70–99)

## 2022-10-25 SURGERY — COLONOSCOPY WITH PROPOFOL
Anesthesia: General

## 2022-10-25 MED ORDER — LACTATED RINGERS IV SOLN: INTRAVENOUS | Status: DC

## 2022-10-25 MED ORDER — PROPOFOL 500 MG/50ML IV EMUL
INTRAVENOUS | Status: DC | PRN
  Administered 2022-10-25: 150 ug/kg/min via INTRAVENOUS

## 2022-10-25 MED ORDER — PROPOFOL 10 MG/ML IV BOLUS
INTRAVENOUS | Status: DC | PRN
  Administered 2022-10-25: 50 mg via INTRAVENOUS

## 2022-10-25 NOTE — Op Note (Signed)
[facility] Patient Name: Mckenzie Leonard Procedure Date: 10/25/2022 7:53 AM MRN: [id number] Date of Birth: 1951-01-25 Attending MD: Katrinka Blazing , , [id number] CSN: [id number] Age: 72 Admit Type: Outpatient Procedure:                Colonoscopy Indications:              Positive fecal occult blood test Providers:                Katrinka Blazing, Sheran Fava, Lennice Sites                            Technician, Technician, Elinor Parkinson Referring MD:              Medicines:                Monitored Anesthesia Care Complications:            No immediate complications. Estimated Blood Loss:     Estimated blood loss: none. Procedure:                Pre-Anesthesia Assessment:                           - Prior to the procedure, a History and Physical                            was performed, and patient medications, allergies                            and sensitivities were reviewed. The patient's                            tolerance of previous anesthesia was reviewed.                           - The risks and benefits of the procedure and the                            sedation options and risks were discussed with the                            patient. All questions were answered and informed                            consent was obtained.                           - ASA Grade Assessment: II - A patient with mild                            systemic disease.                           After obtaining informed consent, the colonoscope                            was passed under direct vision. Throughout the  procedure, the patient's blood pressure, pulse, and                            oxygen saturations were monitored continuously. The                            [id number]-[id number] ([id number]) scope was introduced through                            the anus and advanced to the the cecum, identified                            by appendiceal orifice and  ileocecal valve. The                            colonoscopy was performed without difficulty. The                            patient tolerated the procedure well. The quality                            of the bowel preparation was excellent. Scope In: 8:07:34 AM Scope Out: 8:28:31 AM Scope Withdrawal Time: 0 hours 14 minutes 29 seconds  Total Procedure Duration: 0 hours 20 minutes 57 seconds  Findings:      Skin tags were found on perianal exam.      A 3 mm polyp was found in the transverse colon. The polyp was sessile.       The polyp was removed with a cold snare. Resection and retrieval were       complete.      Scattered medium-mouthed and small-mouthed diverticula were found in the       sigmoid colon and descending colon.      The retroflexed view of the distal rectum and anal verge was normal and       showed no anal or rectal abnormalities. Impression:               - Perianal skin tags found on perianal exam.                           - One 3 mm polyp in the transverse colon, removed                            with a cold snare. Resected and retrieved.                           - Diverticulosis in the sigmoid colon and in the                            descending colon.                           - The distal rectum and anal verge are normal on  retroflexion view. Moderate Sedation:      Per Anesthesia Care Recommendation:           - Discharge patient to home (ambulatory).                           - Resume previous diet.                           - Await pathology results.                           - Repeat colonoscopy for surveillance based on                            pathology results. Procedure Code(s):        --- Professional ---                           [phone], Colonoscopy, flexible; with removal of                            tumor(s), polyp(s), or other lesion(s) by snare                            technique Diagnosis Code(s):        ---  Professional ---                           D12.3, Benign neoplasm of transverse colon (hepatic                            flexure or splenic flexure)                           K64.4, Residual hemorrhoidal skin tags                           R19.5, Other fecal abnormalities                           K57.30, Diverticulosis of large intestine without                            perforation or abscess without bleeding CPT copyright 2022 American Medical Association. All rights reserved. The codes documented in this report are preliminary and upon coder review may  be revised to meet current compliance requirements. Katrinka Blazing, MD Katrinka Blazing,  10/25/2022 8:33:57 AM This report has been signed electronically. Number of Addenda: 0

## 2022-10-25 NOTE — H&P (Signed)
Mckenzie Leonard is an 72 y.o. female.   Chief Complaint: positive FOBT HPI: 72 y/o F with PMH DM, HTN, hypothyroidism, arthritis, coming for positive FOBT.   The patient denies having any nausea, vomiting, fever, chills, hematochezia, melena, hematemesis, abdominal distention, abdominal pain, diarrhea, jaundice, pruritus or weight loss.  Patient never had a colonoscopy in the past.  Sister was diagnosed with colon cancer in her 103s.  Past Medical History:  Diagnosis Date   Arthritis    Diabetes mellitus without complication (HCC)    High cholesterol    Hypertension    Hypothyroidism    PONV (postoperative nausea and vomiting)    Thyroid disease     Past Surgical History:  Procedure Laterality Date   BREAST BIOPSY Right    negative   CARPAL TUNNEL RELEASE     Right   CLOSED REDUCTION MANDIBLE  02/20/2012   Procedure: CLOSED REDUCTION MANDIBULAR;  Surgeon: Francene Finders, DDS;  Location: [facility] OR;  Service: Oral Surgery;  Laterality: Right;  Closed reduction zygonach arch fx   EXCISION MELANOMA WITH SENTINEL LYMPH NODE BIOPSY Left 05/24/2021   Procedure: MELANOMA WIDE LOCAL EXCISION;  Surgeon: Berna Bue, MD;  Location: [facility] OR;  Service: General;  Laterality: Left;   JOINT REPLACEMENT     KNEE CARTILAGE SURGERY Left    ORIF FACIAL FRACTURE  02/20/2012   Procedure: OPEN REDUCTION INTERNAL FIXATION (ORIF) MULTIPLE FACIAL FRACTURES;  Surgeon: Francene Finders, DDS;  Location: [facility] OR;  Service: Oral Surgery;  Laterality: Right;  ORIF Rt [facility] (zygomaxillary)   SENTINEL NODE BIOPSY Left 05/24/2021   Procedure: SENTINEL NODE BIOPSY;  Surgeon: Berna Bue, MD;  Location: [facility] OR;  Service: General;  Laterality: Left;   TOTAL KNEE ARTHROPLASTY Left 10/12/2016   Procedure: TOTAL KNEE ARTHROPLASTY;  Surgeon: Vickki Hearing, MD;  Location: [facility];  Service: Orthopedics;  Laterality: Left;    Family History  Problem Relation Age of Onset   Arthritis Mother    Heart disease  Mother        enlarged heart   Anemia Mother    Stroke Mother    Hearing loss Mother    Arthritis Father    Diabetes Father    Hypertension Father    Cancer Sister        breast   Diabetes Brother    CAD Brother    Healthy Daughter    Healthy Daughter    Social History:  reports that she has never smoked. She has never used smokeless tobacco. She reports that she does not drink alcohol and does not use drugs.  Allergies:  Allergies  Allergen Reactions   Sulfa Antibiotics Anaphylaxis   Statins Other (See Comments)    One of them caused muscle pain   Tetanus Toxoids     Reports flu like symptoms and swelling at injection site only    Medications Prior to Admission  Medication Sig Dispense Refill   Ascorbic Acid (VITAMIN C PO) Take 2,000 mg by mouth daily.     aspirin EC 81 MG tablet Take 81 mg by mouth daily. Swallow whole.     BIOTIN PO Take 1 tablet by mouth daily.     Blood Glucose Monitoring Suppl (BLOOD GLUCOSE METER KIT AND SUPPLIES) KIT Dispense based on patient and insurance preference. Use to check BG once daily.  ICD 10 code = E11.9 1 each 0   Calcium Citrate-Vitamin D (CALCIUM + D PO) Take 1 tablet by mouth  daily.     Cholecalciferol (VITAMIN D3 ULTRA STRENGTH) 125 MCG (5000 UT) capsule Take 5,000 Units by mouth daily.     furosemide (LASIX) 20 MG tablet Take 1 tablet (20 mg total) by mouth daily. 30 tablet 3   glucose blood (ONETOUCH ULTRA) test strip CHECK BLOOD SUGAR DAILY AS DIRECTED 100 strip 9   levothyroxine (SYNTHROID) 50 MCG tablet Take 1 tablet (50 mcg total) by mouth daily. 90 tablet 0   Magnesium 400 MG TABS Take 400 mg by mouth daily.     Misc Natural Products (JOINT HEALTH PO) Take 1 tablet by mouth daily.     Multiple Vitamin (MULTIVITAMIN WITH MINERALS) TABS tablet Take 1 tablet by mouth daily.     naproxen sodium (ALEVE) 220 MG tablet Take 220 mg by mouth daily.     olmesartan-hydrochlorothiazide (BENICAR HCT) 40-25 MG tablet Take 1 tablet by mouth  daily. 90 tablet 1   Zinc 50 MG TABS Take 50 mg by mouth daily.      Results for orders placed or performed during the hospital encounter of 10/25/22 (from the past 48 hour(s))  Glucose, capillary     Status: Abnormal   Collection Time: 10/25/22  7:03 AM  Result Value Ref Range   Glucose-Capillary 123 (H) 70 - 99 mg/dL    Comment: Glucose reference range applies only to samples taken after fasting for at least 8 hours.   No results found.  Review of Systems  All other systems reviewed and are negative.   Blood pressure 134/68, temperature 97.9 F (36.6 C), resp. rate 18, height 5\' 3"  (1.6 m), weight 92 kg, SpO2 98%. Physical Exam  GENERAL: The patient is AO x3, in no acute distress. HEENT: Head is normocephalic and atraumatic. EOMI are intact. Mouth is well hydrated and without lesions. NECK: Supple. No masses LUNGS: Clear to auscultation. No presence of rhonchi/wheezing/rales. Adequate chest expansion HEART: RRR, normal s1 and s2. ABDOMEN: Soft, nontender, no guarding, no peritoneal signs, and nondistended. BS +. No masses. EXTREMITIES: Without any cyanosis, clubbing, rash, lesions or edema. NEUROLOGIC: AOx3, no focal motor deficit. SKIN: no jaundice, no rashes  Assessment/Plan 72 y/o F with PMH DM, HTN, hypothyroidism, arthritis, coming for positive FOBT.  Will proceed with colonoscopy.  Dolores Frame, MD 10/25/2022, 7:25 AM

## 2022-10-25 NOTE — Discharge Instructions (Signed)
You are being discharged to home.  Resume your previous diet.  We are waiting for your pathology results.  Your physician has recommended a repeat colonoscopy for surveillance based on pathology results.  

## 2022-10-25 NOTE — Anesthesia Preprocedure Evaluation (Signed)
Anesthesia Evaluation  Patient identified by MRN, date of birth, ID band Patient awake    Reviewed: Allergy & Precautions, H&P , NPO status , Patient's Chart, lab work & pertinent test results, reviewed documented beta blocker date and time   History of Anesthesia Complications (+) PONV and history of anesthetic complications  Airway Mallampati: II  TM Distance: >3 FB Neck ROM: full    Dental no notable dental hx.    Pulmonary neg pulmonary ROS   Pulmonary exam normal breath sounds clear to auscultation       Cardiovascular Exercise Tolerance: Good hypertension, + Peripheral Vascular Disease  negative cardio ROS  Rhythm:regular Rate:Normal     Neuro/Psych negative neurological ROS  negative psych ROS   GI/Hepatic negative GI ROS, Neg liver ROS,,,  Endo/Other  negative endocrine ROSdiabetesHypothyroidism    Renal/GU negative Renal ROS  negative genitourinary   Musculoskeletal   Abdominal   Peds  Hematology negative hematology ROS (+)   Anesthesia Other Findings   Reproductive/Obstetrics negative OB ROS                             Anesthesia Physical Anesthesia Plan  ASA: 2  Anesthesia Plan: General   Post-op Pain Management:    Induction:   PONV Risk Score and Plan: Propofol infusion  Airway Management Planned:   Additional Equipment:   Intra-op Plan:   Post-operative Plan:   Informed Consent: I have reviewed the patients History and Physical, chart, labs and discussed the procedure including the risks, benefits and alternatives for the proposed anesthesia with the patient or authorized representative who has indicated his/her understanding and acceptance.     Dental Advisory Given  Plan Discussed with: CRNA  Anesthesia Plan Comments:        Anesthesia Quick Evaluation

## 2022-10-25 NOTE — Transfer of Care (Signed)
Immediate Anesthesia Transfer of Care Note  Patient: Mckenzie Leonard  Procedure(s) Performed: COLONOSCOPY WITH PROPOFOL POLYPECTOMY  Patient Location: Short Stay  Anesthesia Type:General  Level of Consciousness: awake  Airway & Oxygen Therapy: Patient Spontanous Breathing  Post-op Assessment: Report given to RN and Post -op Vital signs reviewed and stable  Post vital signs: Reviewed and stable  Last Vitals:  Vitals Value Taken Time  BP 112/56 10/25/22 0833  Temp 36.4 C 10/25/22 0833  Pulse 70 10/25/22 0833  Resp 22 10/25/22 0833  SpO2 97 % 10/25/22 0833    Last Pain:  Vitals:   10/25/22 0804  PainSc: 0-No pain         Complications: No notable events documented.

## 2022-10-26 LAB — SURGICAL PATHOLOGY

## 2022-10-26 NOTE — Anesthesia Postprocedure Evaluation (Signed)
Anesthesia Post Note  Patient: Mckenzie Leonard  Procedure(s) Performed: COLONOSCOPY WITH PROPOFOL POLYPECTOMY  Patient location during evaluation: Phase II Anesthesia Type: General Level of consciousness: awake Pain management: pain level controlled Vital Signs Assessment: post-procedure vital signs reviewed and stable Respiratory status: spontaneous breathing and respiratory function stable Cardiovascular status: blood pressure returned to baseline and stable Postop Assessment: no headache and no apparent nausea or vomiting Anesthetic complications: no Comments: Late entry   No notable events documented.   Last Vitals:  Vitals:   10/25/22 0711 10/25/22 0833  BP: 134/68 (!) 112/56  Pulse:  70  Resp: 18 (!) 22  Temp: 36.6 C (!) 36.4 C  SpO2: 98% 97%    Last Pain:  Vitals:   10/25/22 0833  PainSc: 0-No pain                 Windell Norfolk

## 2022-10-27 ENCOUNTER — Encounter (HOSPITAL_COMMUNITY): Payer: Self-pay | Admitting: Gastroenterology

## 2022-11-04 ENCOUNTER — Encounter (INDEPENDENT_AMBULATORY_CARE_PROVIDER_SITE_OTHER): Payer: Self-pay | Admitting: *Deleted

## 2022-11-04 ENCOUNTER — Telehealth: Payer: Self-pay | Admitting: Nurse Practitioner

## 2022-12-08 DIAGNOSIS — L570 Actinic keratosis: Secondary | ICD-10-CM | POA: Diagnosis not present

## 2022-12-08 DIAGNOSIS — D225 Melanocytic nevi of trunk: Secondary | ICD-10-CM | POA: Diagnosis not present

## 2022-12-08 DIAGNOSIS — Z08 Encounter for follow-up examination after completed treatment for malignant neoplasm: Secondary | ICD-10-CM | POA: Diagnosis not present

## 2022-12-08 DIAGNOSIS — Z85820 Personal history of malignant melanoma of skin: Secondary | ICD-10-CM | POA: Diagnosis not present

## 2022-12-08 DIAGNOSIS — X32XXXD Exposure to sunlight, subsequent encounter: Secondary | ICD-10-CM | POA: Diagnosis not present

## 2022-12-08 DIAGNOSIS — Z1283 Encounter for screening for malignant neoplasm of skin: Secondary | ICD-10-CM | POA: Diagnosis not present

## 2023-01-09 ENCOUNTER — Other Ambulatory Visit: Payer: Self-pay | Admitting: Nurse Practitioner

## 2023-01-17 ENCOUNTER — Other Ambulatory Visit: Payer: Self-pay | Admitting: Nurse Practitioner

## 2023-01-17 DIAGNOSIS — E785 Hyperlipidemia, unspecified: Secondary | ICD-10-CM

## 2023-01-24 ENCOUNTER — Ambulatory Visit: Payer: Self-pay | Admitting: Nurse Practitioner

## 2023-01-24 DIAGNOSIS — E119 Type 2 diabetes mellitus without complications: Secondary | ICD-10-CM | POA: Diagnosis not present

## 2023-01-24 DIAGNOSIS — I10 Essential (primary) hypertension: Secondary | ICD-10-CM | POA: Diagnosis not present

## 2023-01-24 DIAGNOSIS — E785 Hyperlipidemia, unspecified: Secondary | ICD-10-CM | POA: Diagnosis not present

## 2023-01-24 DIAGNOSIS — S91104A Unspecified open wound of right lesser toe(s) without damage to nail, initial encounter: Secondary | ICD-10-CM | POA: Diagnosis not present

## 2023-01-24 DIAGNOSIS — Z7982 Long term (current) use of aspirin: Secondary | ICD-10-CM | POA: Diagnosis not present

## 2023-01-24 DIAGNOSIS — Z79899 Other long term (current) drug therapy: Secondary | ICD-10-CM | POA: Diagnosis not present

## 2023-01-24 DIAGNOSIS — S91109A Unspecified open wound of unspecified toe(s) without damage to nail, initial encounter: Secondary | ICD-10-CM | POA: Diagnosis not present

## 2023-01-24 DIAGNOSIS — E079 Disorder of thyroid, unspecified: Secondary | ICD-10-CM | POA: Diagnosis not present

## 2023-01-24 NOTE — Telephone Encounter (Signed)
   Chief Complaint: bleeding Symptoms: bleeding from 3rd toe, open cut Frequency: constant Disposition: [x] ED /[] Urgent Care (no appt availability in office) / [] Appointment(In office/virtual)/ []  [facility]/ [] Home Care/ [] Refused Recommended Disposition /[] [facility]/ []  Follow-up with PCP Additional Notes: Patient called in reporting that she cut her 3rd toe on her right foot last night while cutting her toenails. Patient reports that she does take a baby aspirin every day and has not been able to stop the bleeding since it was cut last night. Per protocol, this RN recommended patient go to ED. This RN advised patient to maintain pressure on wound. Patient verbalized understanding.   Copied from CRM [id number]. Topic: Clinical - Red Word Triage >> Jan 24, 2023 10:07 AM Lorin Glass B wrote: Red Word that prompted transfer to Nurse Triage: While cutting toenails last night patient accidentally cut into toe and began heavily bleeding. Bandaged up tightly before bed but this morning patient says toe is still pouring out blood. Reason for Disposition  [1] Bleeding AND [2] won't stop after 10 minutes of direct pressure (using correct technique)  Answer Assessment - Initial Assessment Questions 1. APPEARANCE of INJURY: "What does the injury look like?"      Bleeding and open 2. SIZE: "How large is the cut?"      I'm not sure 3. BLEEDING: "Is it bleeding now?" If Yes, ask: "Is it difficult to stop?"      Yes, difficult to stop 4. LOCATION: "Where is the injury located?"      Third toe, right 5. ONSET: "How long ago did the injury occur?"      Last night 6. MECHANISM: "Tell me how it happened."      I was cutting my toe nails and cut my nail  Protocols used: Cuts and Lacerations-A-AH

## 2023-01-27 ENCOUNTER — Other Ambulatory Visit: Payer: Self-pay | Admitting: Nurse Practitioner

## 2023-01-27 DIAGNOSIS — R600 Localized edema: Secondary | ICD-10-CM

## 2023-01-31 ENCOUNTER — Ambulatory Visit (INDEPENDENT_AMBULATORY_CARE_PROVIDER_SITE_OTHER): Payer: 59 | Admitting: Nurse Practitioner

## 2023-01-31 ENCOUNTER — Encounter: Payer: Self-pay | Admitting: Nurse Practitioner

## 2023-01-31 VITALS — BP 133/79 | HR 82 | Temp 97.3°F | Resp 20 | Ht 63.0 in | Wt 203.0 lb

## 2023-01-31 DIAGNOSIS — S91114D Laceration without foreign body of right lesser toe(s) without damage to nail, subsequent encounter: Secondary | ICD-10-CM

## 2023-01-31 NOTE — Progress Notes (Signed)
   Subjective:    Patient ID: Mckenzie Leonard, female    DOB: Dec 05, 1950, 72 y.o.   MRN: [id number]   Chief Complaint: Hospitalization Follow-up   HPI  Patient went to the ED on 01/24/23 with laceration to toe. She cut her toe with some toe nail clippers. Wound not quit bleeding. In the ED they cauterized with silver nitrate sticks. Is better now.  Patient Active Problem List   Diagnosis Date Noted   Statin intolerance 10/04/2022   Occult blood positive stool 08/08/2022   Peripheral edema 09/03/2019   PAD (peripheral artery disease) (HCC) 09/03/2019   S/P total knee replacement, left 10/12/2016 12/12/2016   Osteopenia 04/30/2014   BMI 32.0-32.9,adult 09/16/2013   Vitamin D deficiency 09/16/2013   Hypokalemia 02/18/2012   HTN (hypertension) 02/18/2012   Diet-controlled diabetes mellitus (HCC) 02/18/2012   Hyperlipidemia associated with type 2 diabetes mellitus (HCC) 02/18/2012   Hypothyroidism (acquired) 02/18/2012       Review of Systems  Constitutional:  Negative for diaphoresis.  Eyes:  Negative for pain.  Respiratory:  Negative for shortness of breath.   Cardiovascular:  Negative for chest pain, palpitations and leg swelling.  Gastrointestinal:  Negative for abdominal pain.  Endocrine: Negative for polydipsia.  Skin:  Negative for rash.  Neurological:  Negative for dizziness, weakness and headaches.  Hematological:  Does not bruise/bleed easily.  All other systems reviewed and are negative.      Objective:   Physical Exam Constitutional:      Appearance: Normal appearance.  Cardiovascular:     Rate and Rhythm: Normal rate and regular rhythm.     Heart sounds: Normal heart sounds.  Pulmonary:     Breath sounds: Normal breath sounds.  Skin:    Comments: Laceration to right middle toe on tip- clean and dry- no erythema and no drainage  Neurological:     General: No focal deficit present.     Mental Status: She is alert and oriented to person, place, and time.     BP 133/79   Pulse 82   Temp (!) 97.3 F (36.3 C) (Temporal)   Resp 20   Ht 5\' 3"  (1.6 m)   Wt 203 lb (92.1 kg)   SpO2 98%   BMI 35.96 kg/m         Assessment & Plan:   Mckenzie Leonard in today with chief complaint of Hospitalization Follow-up   1. Laceration of lesser toe of right foot without foreign body present or damage to nail, subsequent encounter Keep clean and dry Soak in epsom salt BID Watch for signs of infection    The above assessment and management plan was discussed with the patient. The patient verbalized understanding of and has agreed to the management plan. Patient is aware to call the clinic if symptoms persist or worsen. Patient is aware when to return to the clinic for a follow-up visit. Patient educated on when it is appropriate to go to the emergency department.   Mary-Margaret Daphine Deutscher, FNP

## 2023-01-31 NOTE — Patient Instructions (Signed)

## 2023-02-21 ENCOUNTER — Encounter: Payer: Self-pay | Admitting: Nurse Practitioner

## 2023-02-21 NOTE — Telephone Encounter (Signed)
 Care team updated and letter sent for eye exam notes.

## 2023-03-02 ENCOUNTER — Other Ambulatory Visit: Payer: Self-pay | Admitting: Nurse Practitioner

## 2023-03-02 DIAGNOSIS — Z01818 Encounter for other preprocedural examination: Secondary | ICD-10-CM | POA: Diagnosis not present

## 2023-03-02 DIAGNOSIS — R9389 Abnormal findings on diagnostic imaging of other specified body structures: Secondary | ICD-10-CM | POA: Diagnosis not present

## 2023-03-02 DIAGNOSIS — E1365 Other specified diabetes mellitus with hyperglycemia: Secondary | ICD-10-CM | POA: Diagnosis not present

## 2023-03-02 DIAGNOSIS — R600 Localized edema: Secondary | ICD-10-CM

## 2023-03-08 DIAGNOSIS — E039 Hypothyroidism, unspecified: Secondary | ICD-10-CM | POA: Diagnosis not present

## 2023-03-08 DIAGNOSIS — Z79899 Other long term (current) drug therapy: Secondary | ICD-10-CM | POA: Diagnosis not present

## 2023-03-08 DIAGNOSIS — Z7982 Long term (current) use of aspirin: Secondary | ICD-10-CM | POA: Diagnosis not present

## 2023-03-08 DIAGNOSIS — I10 Essential (primary) hypertension: Secondary | ICD-10-CM | POA: Diagnosis not present

## 2023-03-08 DIAGNOSIS — E785 Hyperlipidemia, unspecified: Secondary | ICD-10-CM | POA: Diagnosis not present

## 2023-03-08 DIAGNOSIS — E1151 Type 2 diabetes mellitus with diabetic peripheral angiopathy without gangrene: Secondary | ICD-10-CM | POA: Diagnosis not present

## 2023-03-08 DIAGNOSIS — K317 Polyp of stomach and duodenum: Secondary | ICD-10-CM | POA: Diagnosis not present

## 2023-03-08 DIAGNOSIS — Z5331 Laparoscopic surgical procedure converted to open procedure: Secondary | ICD-10-CM | POA: Diagnosis not present

## 2023-03-09 DIAGNOSIS — I10 Essential (primary) hypertension: Secondary | ICD-10-CM | POA: Diagnosis not present

## 2023-03-09 DIAGNOSIS — Z7982 Long term (current) use of aspirin: Secondary | ICD-10-CM | POA: Diagnosis not present

## 2023-03-09 DIAGNOSIS — Z79899 Other long term (current) drug therapy: Secondary | ICD-10-CM | POA: Diagnosis not present

## 2023-03-09 DIAGNOSIS — E785 Hyperlipidemia, unspecified: Secondary | ICD-10-CM | POA: Diagnosis not present

## 2023-03-09 DIAGNOSIS — Z5331 Laparoscopic surgical procedure converted to open procedure: Secondary | ICD-10-CM | POA: Diagnosis not present

## 2023-03-09 DIAGNOSIS — E039 Hypothyroidism, unspecified: Secondary | ICD-10-CM | POA: Diagnosis not present

## 2023-03-09 DIAGNOSIS — E1151 Type 2 diabetes mellitus with diabetic peripheral angiopathy without gangrene: Secondary | ICD-10-CM | POA: Diagnosis not present

## 2023-03-10 DIAGNOSIS — E785 Hyperlipidemia, unspecified: Secondary | ICD-10-CM | POA: Diagnosis not present

## 2023-03-10 DIAGNOSIS — E1151 Type 2 diabetes mellitus with diabetic peripheral angiopathy without gangrene: Secondary | ICD-10-CM | POA: Diagnosis not present

## 2023-03-10 DIAGNOSIS — Z79899 Other long term (current) drug therapy: Secondary | ICD-10-CM | POA: Diagnosis not present

## 2023-03-10 DIAGNOSIS — I10 Essential (primary) hypertension: Secondary | ICD-10-CM | POA: Diagnosis not present

## 2023-03-10 DIAGNOSIS — E039 Hypothyroidism, unspecified: Secondary | ICD-10-CM | POA: Diagnosis not present

## 2023-03-10 DIAGNOSIS — Z5331 Laparoscopic surgical procedure converted to open procedure: Secondary | ICD-10-CM | POA: Diagnosis not present

## 2023-03-10 DIAGNOSIS — Z7982 Long term (current) use of aspirin: Secondary | ICD-10-CM | POA: Diagnosis not present

## 2023-03-31 ENCOUNTER — Other Ambulatory Visit: Payer: Self-pay | Admitting: Nurse Practitioner

## 2023-03-31 DIAGNOSIS — E039 Hypothyroidism, unspecified: Secondary | ICD-10-CM

## 2023-04-06 ENCOUNTER — Ambulatory Visit: Payer: 59 | Admitting: Nurse Practitioner

## 2023-04-06 ENCOUNTER — Encounter: Payer: Self-pay | Admitting: Nurse Practitioner

## 2023-04-06 VITALS — BP 131/78 | HR 68 | Temp 96.5°F | Ht 63.0 in | Wt 195.0 lb

## 2023-04-06 DIAGNOSIS — I10 Essential (primary) hypertension: Secondary | ICD-10-CM | POA: Diagnosis not present

## 2023-04-06 DIAGNOSIS — E1151 Type 2 diabetes mellitus with diabetic peripheral angiopathy without gangrene: Secondary | ICD-10-CM

## 2023-04-06 DIAGNOSIS — M8588 Other specified disorders of bone density and structure, other site: Secondary | ICD-10-CM | POA: Diagnosis not present

## 2023-04-06 DIAGNOSIS — E1169 Type 2 diabetes mellitus with other specified complication: Secondary | ICD-10-CM | POA: Diagnosis not present

## 2023-04-06 DIAGNOSIS — E785 Hyperlipidemia, unspecified: Secondary | ICD-10-CM

## 2023-04-06 DIAGNOSIS — E119 Type 2 diabetes mellitus without complications: Secondary | ICD-10-CM

## 2023-04-06 DIAGNOSIS — E039 Hypothyroidism, unspecified: Secondary | ICD-10-CM | POA: Diagnosis not present

## 2023-04-06 DIAGNOSIS — R600 Localized edema: Secondary | ICD-10-CM | POA: Diagnosis not present

## 2023-04-06 DIAGNOSIS — E559 Vitamin D deficiency, unspecified: Secondary | ICD-10-CM

## 2023-04-06 DIAGNOSIS — I739 Peripheral vascular disease, unspecified: Secondary | ICD-10-CM

## 2023-04-06 DIAGNOSIS — Z6832 Body mass index (BMI) 32.0-32.9, adult: Secondary | ICD-10-CM

## 2023-04-06 MED ORDER — OLMESARTAN MEDOXOMIL-HCTZ 40-25 MG PO TABS
1.0000 | ORAL_TABLET | Freq: Every day | ORAL | 1 refills | Status: DC
Start: 2023-04-06 — End: 2023-10-03

## 2023-04-06 NOTE — Patient Instructions (Signed)

## 2023-04-06 NOTE — Progress Notes (Signed)
 Subjective:    Patient ID: Mckenzie Leonard, female    DOB: [birth year], 73 y.o.   MRN: [id number]   Chief Complaint: medical management of chronic issues     HPI:  Mckenzie Leonard is a 73 y.o. who identifies as a female who was assigned female at birth.   Social history: Lives with: by herself- family checks on her frequently Work history: retired   Water Engineer in today for follow up of the following chronic medical issues:  1. Primary hypertension No c/o chest pain, sob or headache. Does not check blood pressure at home. BP Readings from Last 3 Encounters:  01/31/23 133/79  10/25/22 (!) 112/56  10/04/22 134/71     2. Hyperlipidemia associated with type 2 diabetes mellitus (HCC) Does not watch diet and does no dedicated exercise. She stopped taking statin due to myalgia. Lab Results  Component Value Date   CHOL 214 (H) 10/04/2022   HDL 44 10/04/2022   LDLCALC 131 (H) 10/04/2022   TRIG 221 (H) 10/04/2022   CHOLHDL 4.9 (H) 10/04/2022   The 10-year ASCVD risk score (Arnett DK, et al., 2019) is: 35.3%   3. PAD (peripheral artery disease) (HCC) Mainly in bil lower ext. Not bothering her currently  4. Hypothyroidism (acquired) No issues that she is aware of Lab Results  Component Value Date   TSH 2.340 10/04/2022     5. Diet-controlled diabetes mellitus (HCC) Fasting blood sugars are running around 120-140. No low blood sugars HGBA1c at [facility]- 03/02/23 6.1%   6. Hypokalemia No c/o muscle cramping Lab Results  Component Value Date   K 4.0 10/04/2022     7. Peripheral edema Has daily lower ext edema. Resolves mostly at night.  8. Vitamin D  deficiency Daily vitamin d  supplement  9. Osteopenia of lumbar spine Last dexscan as done on 03/28/22. T score was -1.0  10. BMI 32.0-32.9,adult Weight is unchanged Wt Readings from Last 3 Encounters:  04/06/23 195 lb (88.5 kg)  01/31/23 203 lb (92.1 kg)  10/25/22 202 lb 13.2 oz (92 kg)   BMI Readings from Last 3  Encounters:  04/06/23 34.54 kg/m  01/31/23 35.96 kg/m  10/25/22 35.93 kg/m      New complaints: HAD HYSTERECTOMY AT FIRST OF jANUARY  Allergies  Allergen Reactions   Sulfa Antibiotics Anaphylaxis   Statins Other (See Comments)    One of them caused muscle pain   Tetanus Toxoids     Reports flu like symptoms and swelling at injection site only   Outpatient Encounter Medications as of 04/06/2023  Medication Sig   Ascorbic Acid (VITAMIN C PO) Take 2,000 mg by mouth daily.   aspirin  EC 81 MG tablet Take 81 mg by mouth daily. Swallow whole.   BIOTIN PO Take 1 tablet by mouth daily.   Blood Glucose Monitoring Suppl (BLOOD GLUCOSE METER KIT AND SUPPLIES) KIT Dispense based on patient and insurance preference. Use to check BG once daily.  ICD 10 code = E11.9   Calcium  Citrate-Vitamin D  (CALCIUM  + D PO) Take 1 tablet by mouth daily.   Cholecalciferol  (VITAMIN D3 ULTRA STRENGTH) 125 MCG (5000 UT) capsule Take 5,000 Units by mouth daily.   furosemide  (LASIX ) 20 MG tablet TAKE ONE TABLET DAILY   levothyroxine  (SYNTHROID ) 50 MCG tablet TAKE ONE TABLET DAILY   Magnesium  400 MG TABS Take 400 mg by mouth daily.   Misc Natural Products (JOINT HEALTH PO) Take 1 tablet by mouth daily.   Multiple Vitamin (MULTIVITAMIN WITH MINERALS)  TABS tablet Take 1 tablet by mouth daily.   naproxen sodium (ALEVE) 220 MG tablet Take 220 mg by mouth daily.   olmesartan -hydrochlorothiazide  (BENICAR  HCT) 40-25 MG tablet Take 1 tablet by mouth daily.   ONETOUCH ULTRA test strip CHECK BLOOD SUGAR DAILY AS DIRECTED   Zinc  50 MG TABS Take 50 mg by mouth daily.   No facility-administered encounter medications on file as of 04/06/2023.    Past Surgical History:  Procedure Laterality Date   BREAST BIOPSY Right    negative   CARPAL TUNNEL RELEASE     Right   CLOSED REDUCTION MANDIBLE  02/20/2012   Procedure: CLOSED REDUCTION MANDIBULAR;  Surgeon: Lonni LITTIE Sax, DDS;  Location: [facility] OR;  Service: Oral Surgery;   Laterality: Right;  Closed reduction zygonach arch fx   COLONOSCOPY WITH PROPOFOL  N/A 10/25/2022   Procedure: COLONOSCOPY WITH PROPOFOL ;  Surgeon: Eartha Angelia Sieving, MD;  Location: [facility];  Service: Gastroenterology;  Laterality: N/A;  10:30am;asa 3   EXCISION MELANOMA WITH SENTINEL LYMPH NODE BIOPSY Left 05/24/2021   Procedure: MELANOMA WIDE LOCAL EXCISION;  Surgeon: Signe Mitzie LABOR, MD;  Location: [facility] OR;  Service: General;  Laterality: Left;   JOINT REPLACEMENT     KNEE CARTILAGE SURGERY Left    ORIF FACIAL FRACTURE  02/20/2012   Procedure: OPEN REDUCTION INTERNAL FIXATION (ORIF) MULTIPLE FACIAL FRACTURES;  Surgeon: Lonni LITTIE Sax, DDS;  Location: [facility] OR;  Service: Oral Surgery;  Laterality: Right;  ORIF Rt [facility] (zygomaxillary)   POLYPECTOMY  10/25/2022   Procedure: POLYPECTOMY;  Surgeon: Eartha Angelia Sieving, MD;  Location: [facility];  Service: Gastroenterology;;   SENTINEL NODE BIOPSY Left 05/24/2021   Procedure: SENTINEL NODE BIOPSY;  Surgeon: Signe Mitzie LABOR, MD;  Location: [facility] OR;  Service: General;  Laterality: Left;   TOTAL KNEE ARTHROPLASTY Left 10/12/2016   Procedure: TOTAL KNEE ARTHROPLASTY;  Surgeon: Margrette Taft BRAVO, MD;  Location: [facility];  Service: Orthopedics;  Laterality: Left;    Family History  Problem Relation Age of Onset   Arthritis Mother    Heart disease Mother        enlarged heart   Anemia Mother    Stroke Mother    Hearing loss Mother    Arthritis Father    Diabetes Father    Hypertension Father    Cancer Sister        breast   Diabetes Brother    CAD Brother    Healthy Daughter    Healthy Daughter       Controlled substance contract: n/a      Review of Systems  Constitutional:  Negative for diaphoresis.  Eyes:  Negative for pain.  Respiratory:  Negative for shortness of breath.   Cardiovascular:  Negative for chest pain, palpitations and leg swelling.  Gastrointestinal:  Negative for abdominal pain.   Endocrine: Negative for polydipsia.  Skin:  Negative for rash.  Neurological:  Negative for dizziness, weakness and headaches.  Hematological:  Does not bruise/bleed easily.  All other systems reviewed and are negative.      Objective:   Physical Exam Vitals and nursing note reviewed.  Constitutional:      General: She is not in acute distress.    Appearance: Normal appearance. She is well-developed.  HENT:     Head: Normocephalic.     Right Ear: Tympanic membrane normal.     Left Ear: Tympanic membrane normal.     Nose: Nose normal.     Mouth/Throat:  Mouth: Mucous membranes are moist.  Eyes:     Pupils: Pupils are equal, round, and reactive to light.  Neck:     Vascular: No carotid bruit or JVD.  Cardiovascular:     Rate and Rhythm: Normal rate and regular rhythm.     Heart sounds: Normal heart sounds.  Pulmonary:     Effort: Pulmonary effort is normal. No respiratory distress.     Breath sounds: Normal breath sounds. No wheezing or rales.  Chest:     Chest wall: No tenderness.  Abdominal:     General: Bowel sounds are normal. There is no distension or abdominal bruit.     Palpations: Abdomen is soft. There is no hepatomegaly, splenomegaly, mass or pulsatile mass.     Tenderness: There is no abdominal tenderness.  Musculoskeletal:        General: Normal range of motion.     Cervical back: Normal range of motion and neck supple.     Right lower leg: Edema (2+) present.     Left lower leg: Edema (2+) present.  Lymphadenopathy:     Cervical: No cervical adenopathy.  Skin:    General: Skin is warm and dry.  Neurological:     Mental Status: She is alert and oriented to person, place, and time.     Deep Tendon Reflexes: Reflexes are normal and symmetric.  Psychiatric:        Behavior: Behavior normal.        Thought Content: Thought content normal.        Judgment: Judgment normal.    BP 131/78   Pulse 68   Temp (!) 96.5 F (35.8 C) (Temporal)   Ht 5' 3  (1.6 m)   Wt 195 lb (88.5 kg)   SpO2 99%   BMI 34.54 kg/m    HGBA1c 6.3%       Assessment & Plan:  Mckenzie Leonard comes in today with chief complaint of No chief complaint on file.   Diagnosis and orders addressed:  1. Primary hypertension Low sodium diet - CBC with Differential/Platelet - CMP14+EGFR - olmesartan -hydrochlorothiazide  (BENICAR  HCT) 40-25 MG tablet; Take 1 tablet by mouth daily.  Dispense: 90 tablet; Refill: 1  2. Hyperlipidemia associated with type 2 diabetes mellitus (HCC) Low fat diet - Lipid panel  3. Statin intolerance   4. PAD (peripheral artery disease) (HCC)   5. Hypothyroidism (acquired) Labs pending - levothyroxine  (SYNTHROID ) 50 MCG tablet; Take 1 tablet (50 mcg total) by mouth daily.  Dispense: 90 tablet; Refill: 0  6. Diet-controlled diabetes mellitus (HCC) Continue to watch carbs in diet - Bayer DCA Hb A1c Waived - Microalbumin / creatinine urine ratio  7. Hypokalemia Labs pending  8. Peripheral edema Elevate legs when sitting Compression socks Added lasix  today - furosemide  (LASIX ) 20 MG tablet; Take 1 tablet (20 mg total) by mouth daily.  Dispense: 30 tablet; Refill: 3  9. Vitamin D  deficiency Continue vitamin d  supplement  10. Osteopenia of lumbar spine Weight bearing exercises encouraged  11. BMI 32.0-32.9,adult Discussed diet and exercise for person with BMI >25 Will recheck weight in 3-6 months    Labs reveiwed from hospital at appointment Health Maintenance reviewed Diet and exercise encouraged  Follow up plan: 6 months   Mary-Margaret Gladis, FNP

## 2023-05-01 ENCOUNTER — Other Ambulatory Visit: Payer: Self-pay | Admitting: Nurse Practitioner

## 2023-05-01 DIAGNOSIS — R600 Localized edema: Secondary | ICD-10-CM

## 2023-07-03 ENCOUNTER — Other Ambulatory Visit (INDEPENDENT_AMBULATORY_CARE_PROVIDER_SITE_OTHER)

## 2023-07-03 ENCOUNTER — Encounter: Payer: Self-pay | Admitting: Orthopedic Surgery

## 2023-07-03 ENCOUNTER — Ambulatory Visit (INDEPENDENT_AMBULATORY_CARE_PROVIDER_SITE_OTHER): Admitting: Orthopedic Surgery

## 2023-07-03 VITALS — BP 126/64 | HR 77 | Ht 63.0 in | Wt 196.0 lb

## 2023-07-03 DIAGNOSIS — M25572 Pain in left ankle and joints of left foot: Secondary | ICD-10-CM

## 2023-07-03 DIAGNOSIS — I872 Venous insufficiency (chronic) (peripheral): Secondary | ICD-10-CM

## 2023-07-03 NOTE — Patient Instructions (Signed)
 You will get a call from [facility] for appointment

## 2023-07-03 NOTE — Progress Notes (Signed)
  Intake history:  There were no vitals taken for this visit. There is no height or weight on file to calculate BMI.    WHAT ARE WE SEEING YOU FOR TODAY?   left ankle(s)/ leg swollen has rash on front of lower leg   How long has this bothered you? (DOI?DOS?WS?)  Since knee surgery left 2018 has had swelling  Anticoag.  No  Diabetes yes  Heart disease No  Hypertension Yes  SMOKING HX No  Kidney disease No  Any ALLERGIES ______________ Allergies  Allergen Reactions   Sulfa Antibiotics Anaphylaxis   Statins Other (See Comments)    One of them caused muscle pain   Tetanus Toxoids     Reports flu like symptoms and swelling at injection site only   ________________________________   Treatment:  Have you taken:  Tylenol  No  Advil  No  Had PT No  Had injection No  Other  _________________________

## 2023-07-03 NOTE — Progress Notes (Signed)
 New patient Last seen on September 2021 She had a left total knee replacement in 2018 did well  Chief Complaint  Patient presents with   Ankle Problem    left    73 year old female no history of trauma complains of swelling in the left ankle and discoloration  She has some discomfort and what she describes as an indentation on the medial side of her left ankle  Review of systems negative for any chest pain shortness of breath or arthritic type ankle pain   Problem list, medical hx, medications and allergies reviewed     Physical Exam Vitals and nursing note reviewed.  Constitutional:      Appearance: Normal appearance.  HENT:     Head: Normocephalic and atraumatic.  Eyes:     General: No scleral icterus.       Right eye: No discharge.        Left eye: No discharge.     Extraocular Movements: Extraocular movements intact.     Conjunctiva/sclera: Conjunctivae normal.     Pupils: Pupils are equal, round, and reactive to light.  Cardiovascular:     Rate and Rhythm: Normal rate.     Pulses: Normal pulses.  Musculoskeletal:     Right lower leg: 1+ Edema present.     Left lower leg: 3+ Edema present.  Skin:    General: Skin is warm and dry.     Capillary Refill: Capillary refill takes less than 2 seconds.  Neurological:     General: No focal deficit present.     Mental Status: She is alert and oriented to person, place, and time.  Psychiatric:        Mood and Affect: Mood normal.        Behavior: Behavior normal.        Thought Content: Thought content normal.        Judgment: Judgment normal.    Assessment and plan  Encounter Diagnoses  Name Primary?   Venous insufficiency (chronic) (peripheral) Yes   Pain in left ankle and joints of left foot    DG Ankle Complete Left Result Date: 07/03/2023 Left ankle x-ray left ankle swelling No fracture dislocation normal ankle mortise Normal x-ray left ankle     Venous insufficiency chronic left greater than right with  skin changes consistent with venous stasis disease  Consult vascular surgery for definitive management and treatment

## 2023-07-31 ENCOUNTER — Encounter: Payer: Self-pay | Admitting: Nurse Practitioner

## 2023-07-31 ENCOUNTER — Ambulatory Visit: Admitting: Nurse Practitioner

## 2023-07-31 VITALS — BP 144/79 | HR 78 | Temp 97.7°F | Ht 63.0 in | Wt 199.0 lb

## 2023-07-31 DIAGNOSIS — L03012 Cellulitis of left finger: Secondary | ICD-10-CM | POA: Diagnosis not present

## 2023-07-31 MED ORDER — DOXYCYCLINE HYCLATE 100 MG PO TABS: 100.0000 mg | ORAL_TABLET | Freq: Two times a day (BID) | ORAL | 0 refills | Status: DC

## 2023-07-31 NOTE — Patient Instructions (Signed)
 Paronychia Paronychia is an infection of the skin that surrounds a nail. It usually affects the skin around a fingernail, but it may also occur near a toenail. It often causes pain and swelling around the nail. In some cases, a collection of pus (abscess) can form near or under the nail.  This condition may develop suddenly, or it may develop gradually over a longer period. In most cases, paronychia is not serious, and it will clear up with treatment. What are the causes? This condition may be caused by bacteria or a fungus, such as yeast. The bacteria or fungus can enter the body through an opening in the skin, such as a cut or a hangnail, and cause an infection in your fingernail or toenail. Other causes may include: Recurrent injury to the fingernail or toenail area. Irritation of the base and sides of the nail (cuticle). Injury and irritation can result in inflammation, swelling, and thickened skin around the nail. What increases the risk? This condition is more likely to develop in people who: Get their hands wet often, such as those who work as Fish farm manager, bartenders, or housekeepers. Bite their fingernails or cuticles. Have underlying skin conditions. Have hangnails or injured fingertips. Are exposed to irritants like detergents and other chemicals. Have diabetes. What are the signs or symptoms? Symptoms of this condition include: Redness and swelling of the skin near the nail. Tenderness around the nail when you touch the area. Pus-filled bumps under the cuticle. Fluid or pus under the nail. Throbbing pain in the area. How is this diagnosed? This condition is diagnosed with a physical exam. In some cases, a sample of pus may be tested to determine what type of bacteria or fungus is causing the condition. How is this treated? Treatment depends on the cause and severity of your condition. If your condition is mild, it may clear up on its own in a few days or after soaking in warm  water. If needed, treatment may include: Antibiotic medicine, if your infection is caused by bacteria. Antifungal medicine, if your infection is caused by a fungus. A procedure to drain pus from an abscess. Anti-inflammatory medicine (corticosteroids). Removal of part of an ingrown toenail. A bandage (dressing) may be placed over the affected area if an abscess or part of a nail has been removed. Follow these instructions at home: Wound care Keep the affected area clean. Soak the affected area in warm water if told to do so by your health care provider. You may be told to do this for 20 minutes, 2-3 times a day. Keep the area dry when you are not soaking it. Do not try to drain an abscess yourself. Follow instructions from your health care provider about how to take care of the affected area. Make sure you: Wash your hands with soap and water for at least 20 seconds before and after you change your dressing. If soap and water are not available, use hand sanitizer. Change your dressing as told by your health care provider. If you had an abscess drained, check the area every day for signs of infection. Check for: Redness, swelling, or pain. Fluid or blood. Warmth. Pus or a bad smell. Medicines  Take over-the-counter and prescription medicines only as told by your health care provider. If you were prescribed an antibiotic medicine, take it as told by your health care provider. Do not stop taking the antibiotic even if you start to feel better. General instructions Avoid contact with any skin irritants or allergens.  Do not pick at the affected area. Keep all follow-up visits as told. This is important. Prevention To prevent this condition from happening again: Wear rubber gloves when washing dishes or doing other tasks that require your hands to get wet. Wear gloves if your hands might come in contact with cleaners or other chemicals. Avoid injuring your nails or fingertips. Do not bite  your nails or tear hangnails. Do not cut your nails very short. Do not cut your cuticles. Use clean nail clippers or scissors when trimming nails. Contact a health care provider if: Your symptoms get worse or do not improve with treatment. You have continued or increased fluid, blood, or pus coming from the affected area. Your affected finger, toe, or joint becomes swollen or difficult to move. You have a fever or chills. There is redness spreading away from the affected area. Summary Paronychia is an infection of the skin that surrounds a nail. It often causes pain and swelling around the nail. In some cases, a collection of pus (abscess) can form near or under the nail. This condition may be caused by bacteria or a fungus. These germs can enter the body through an opening in the skin, such as a cut or a hangnail. If your condition is mild, it may clear up on its own in a few days. If needed, treatment may include medicine or a procedure to drain pus from an abscess. To prevent this condition from happening again, wear gloves if doing tasks that require your hands to get wet or to come in contact with chemicals. Also avoid injuring your nails or fingertips. This information is not intended to replace advice given to you by your health care provider. Make sure you discuss any questions you have with your health care provider. Document Revised: 05/18/2020 Document Reviewed: 05/18/2020 Elsevier Patient Education  2024 [employer].

## 2023-07-31 NOTE — Progress Notes (Signed)
   Subjective:    Patient ID: Mckenzie Leonard, female    DOB: [birth year], 73 y.o.   MRN: [id number]   Chief Complaint: Middle finger swollen (Left hand)   HPI  Patient comes in today c/o middle finger being swollen. Started Saturday. Is red and very tender to tough.  Patient Active Problem List   Diagnosis Date Noted   Statin intolerance 10/04/2022   Occult blood positive stool 08/08/2022   Peripheral edema 09/03/2019   PAD (peripheral artery disease) (HCC) 09/03/2019   S/P total knee replacement, left 10/12/2016 12/12/2016   Osteopenia 04/30/2014   BMI 32.0-32.9,adult 09/16/2013   Vitamin D  deficiency 09/16/2013   Hypokalemia 02/18/2012   HTN (hypertension) 02/18/2012   Diet-controlled diabetes mellitus (HCC) 02/18/2012   Hyperlipidemia associated with type 2 diabetes mellitus (HCC) 02/18/2012   Hypothyroidism (acquired) 02/18/2012       Review of Systems  Constitutional:  Negative for diaphoresis.  Eyes:  Negative for pain.  Respiratory:  Negative for shortness of breath.   Cardiovascular:  Negative for chest pain, palpitations and leg swelling.  Gastrointestinal:  Negative for abdominal pain.  Endocrine: Negative for polydipsia.  Skin:  Negative for rash.  Neurological:  Negative for dizziness, weakness and headaches.  Hematological:  Does not bruise/bleed easily.  All other systems reviewed and are negative.      Objective:   Physical Exam Constitutional:      Appearance: Normal appearance.  Cardiovascular:     Rate and Rhythm: Normal rate and regular rhythm.     Heart sounds: Normal heart sounds.  Pulmonary:     Breath sounds: Normal breath sounds.  Skin:    General: Skin is warm.  Neurological:     General: No focal deficit present.     Mental Status: She is alert and oriented to person, place, and time.  Psychiatric:        Mood and Affect: Mood normal.        Behavior: Behavior normal.     BP (!) 144/79   Pulse 78   Temp 97.7 F (36.5 C)  (Temporal)   Ht 5\' 3"  (1.6 m)   Wt 199 lb (90.3 kg)   SpO2 97%   BMI 35.25 kg/m        Assessment & Plan:   Mckenzie Leonard in today with chief complaint of Middle finger swollen (Left hand)   1. Paronychia of left middle finger (Primary) Epsom salt soaks Take all of antibiotics RTO prn  Meds ordered this encounter  Medications   doxycycline (VIBRA-TABS) 100 MG tablet    Sig: Take 1 tablet (100 mg total) by mouth 2 (two) times daily. 1 po bid    Dispense:  20 tablet    Refill:  0    Supervising Provider:   Jolyne Needs A [[id number]]       The above assessment and management plan was discussed with the patient. The patient verbalized understanding of and has agreed to the management plan. Patient is aware to call the clinic if symptoms persist or worsen. Patient is aware when to return to the clinic for a follow-up visit. Patient educated on when it is appropriate to go to the emergency department.   Mary-Margaret Gaylyn Keas, FNP

## 2023-08-04 ENCOUNTER — Telehealth: Payer: Self-pay | Admitting: Family Medicine

## 2023-08-04 NOTE — Telephone Encounter (Signed)
 Patient calling in returning a call from the office

## 2023-08-04 NOTE — Telephone Encounter (Signed)
 Called to schedule dm eye exam

## 2023-08-04 NOTE — Telephone Encounter (Signed)
 Please schedule patient a diabetic eye exam if she is willing to have it done here

## 2023-08-07 NOTE — Telephone Encounter (Signed)
 Spoke with pt stated she goes to my eye dr and has appt in July

## 2023-08-21 ENCOUNTER — Other Ambulatory Visit (HOSPITAL_COMMUNITY): Payer: Self-pay | Admitting: Nurse Practitioner

## 2023-08-21 DIAGNOSIS — Z1231 Encounter for screening mammogram for malignant neoplasm of breast: Secondary | ICD-10-CM

## 2023-09-04 ENCOUNTER — Other Ambulatory Visit: Payer: Self-pay | Admitting: Vascular Surgery

## 2023-09-04 DIAGNOSIS — M7989 Other specified soft tissue disorders: Secondary | ICD-10-CM

## 2023-09-05 LAB — HM DIABETES EYE EXAM

## 2023-09-14 ENCOUNTER — Ambulatory Visit (HOSPITAL_COMMUNITY)
Admission: RE | Admit: 2023-09-14 | Discharge: 2023-09-14 | Disposition: A | Discharge status: Home / Self Care | Source: Ambulatory Visit | Attending: Vascular Surgery | Admitting: Vascular Surgery

## 2023-09-14 ENCOUNTER — Ambulatory Visit: Attending: Vascular Surgery | Admitting: Vascular Surgery

## 2023-09-14 ENCOUNTER — Encounter: Payer: Self-pay | Admitting: Vascular Surgery

## 2023-09-14 VITALS — BP 131/71 | HR 73 | Temp 97.4°F | Resp 16 | Ht 63.0 in | Wt 197.5 lb

## 2023-09-14 DIAGNOSIS — M7989 Other specified soft tissue disorders: Secondary | ICD-10-CM

## 2023-09-14 DIAGNOSIS — I872 Venous insufficiency (chronic) (peripheral): Secondary | ICD-10-CM | POA: Diagnosis not present

## 2023-09-14 DIAGNOSIS — I890 Lymphedema, not elsewhere classified: Secondary | ICD-10-CM | POA: Diagnosis not present

## 2023-09-14 NOTE — Progress Notes (Signed)
 Office Note     CC: Left lower extremity edema Requesting Provider:  Margrette Taft BRAVO, MD  HPI: Mckenzie Leonard is a 73 y.o. ([birth year]) female who presents at the request of Gladis Mustard, FNP for evaluation of left lower extremity edema.  On exam, [name] was doing well, accompanied by her daughter.  A native of [place] , she has lived there her entire life.  She has 2 children, and works full-time at [employer] prior to retirement.  2019, she had her left knee replaced.  Postoperatively, she has significant edema in the left lower extremity involving the foot and calf.  This swelling waxes and wanes.  It is best in the morning and worse in the evenings.  She notes significant, tired, heavy feeling.  She has associated aching and throbbing pains.  She denies bleeding, ulcerations.  Denies varicosities.  She has no history of DVT.  Has not worn compression stockings.   Past Medical History:  Diagnosis Date   Arthritis    Diabetes mellitus without complication (HCC)    High cholesterol    Hypertension    Hypothyroidism    PONV (postoperative nausea and vomiting)    Thyroid  disease     Past Surgical History:  Procedure Laterality Date   BREAST BIOPSY Right    negative   CARPAL TUNNEL RELEASE     Right   CLOSED REDUCTION MANDIBLE  02/20/2012   Procedure: CLOSED REDUCTION MANDIBULAR;  Surgeon: Lonni LITTIE Sax, DDS;  Location: [facility] OR;  Service: Oral Surgery;  Laterality: Right;  Closed reduction zygonach arch fx   COLONOSCOPY WITH PROPOFOL  N/A 10/25/2022   Procedure: COLONOSCOPY WITH PROPOFOL ;  Surgeon: Eartha Angelia Sieving, MD;  Location: [facility];  Service: Gastroenterology;  Laterality: N/A;  10:30am;asa 3   EXCISION MELANOMA WITH SENTINEL LYMPH NODE BIOPSY Left 05/24/2021   Procedure: MELANOMA WIDE LOCAL EXCISION;  Surgeon: Signe Mitzie LABOR, MD;  Location: [facility] OR;  Service: General;  Laterality: Left;   JOINT REPLACEMENT     KNEE  CARTILAGE SURGERY Left    ORIF FACIAL FRACTURE  02/20/2012   Procedure: OPEN REDUCTION INTERNAL FIXATION (ORIF) MULTIPLE FACIAL FRACTURES;  Surgeon: Lonni LITTIE Sax, DDS;  Location: [facility] OR;  Service: Oral Surgery;  Laterality: Right;  ORIF Rt [facility] (zygomaxillary)   POLYPECTOMY  10/25/2022   Procedure: POLYPECTOMY;  Surgeon: Eartha Angelia Sieving, MD;  Location: [facility];  Service: Gastroenterology;;   SENTINEL NODE BIOPSY Left 05/24/2021   Procedure: SENTINEL NODE BIOPSY;  Surgeon: Signe Mitzie LABOR, MD;  Location: [facility] OR;  Service: General;  Laterality: Left;   TOTAL KNEE ARTHROPLASTY Left 10/12/2016   Procedure: TOTAL KNEE ARTHROPLASTY;  Surgeon: Margrette Taft BRAVO, MD;  Location: [facility] ORS;  Service: Orthopedics;  Laterality: Left;    Social History   Socioeconomic History   Marital status: Divorced    Spouse name: Not on file   Number of children: 2   Years of education: 10   Highest education level: GED or equivalent  Occupational History   Occupation: retired    Comment: Designer, fashion/clothing  Tobacco Use   Smoking status: Never   Smokeless tobacco: Never  Vaping Use   Vaping status: Never Used  Substance and Sexual Activity   Alcohol use: No   Drug use: No   Sexual activity: Yes    Birth control/protection: Post-menopausal  Other Topics Concern   Not on file  Social History Narrative   Spends lots of time with her daughters and  sister, goes to church and senior center frequently; stays on the go   Social Drivers of Health   Financial Resource Strain: Low Risk  (10/07/2022)   Overall Financial Resource Strain (CARDIA)    Difficulty of Paying Living Expenses: Not hard at all  Food Insecurity: No Food Insecurity (10/07/2022)   Hunger Vital Sign    Worried About Running Out of Food in the Last Year: Never true    Ran Out of Food in the Last Year: Never true  Transportation Needs: No Transportation Needs (04/25/2023)   Received from [facility] - [facility]     Lack of Transportation (Medical): No    Lack of Transportation (Non-Medical): No  Physical Activity: Insufficiently Active (10/07/2022)   Exercise Vital Sign    Days of Exercise per Week: 2 days    Minutes of Exercise per Session: 30 min  Stress: No Stress Concern Present (10/07/2022)   [employer] of Occupational Health - Occupational Stress Questionnaire    Feeling of Stress : Not at all  Social Connections: Moderately Isolated (10/07/2022)   Social Connection and Isolation Panel    Frequency of Communication with Friends and Family: More than three times a week    Frequency of Social Gatherings with Friends and Family: More than three times a week    Attends Religious Services: More than 4 times per year    Active Member of [employer] or Organizations: No    Attends Banker Meetings: Never    Marital Status: Divorced  Catering manager Violence: Not At Risk (04/25/2023)   Received from [facility]   Humiliation, Afraid, Rape, and Kick questionnaire    Within the last year, have you been afraid of your partner or ex-partner?: No    Within the last year, have you been humiliated or emotionally abused in other ways by your partner or ex-partner?: No    Within the last year, have you been kicked, hit, slapped, or otherwise physically hurt by your partner or ex-partner?: No    Within the last year, have you been raped or forced to have any kind of sexual activity by your partner or ex-partner?: No   Family History  Problem Relation Age of Onset   Arthritis Mother    Heart disease Mother        enlarged heart   Anemia Mother    Stroke Mother    Hearing loss Mother    Arthritis Father    Diabetes Father    Hypertension Father    Cancer Sister        breast   Diabetes Brother    CAD Brother    Healthy Daughter    Healthy Daughter     Current Outpatient Medications  Medication Sig Dispense Refill   aspirin  EC 81 MG tablet Take 81 mg by mouth daily. Swallow whole.      BIOTIN PO Take 1 tablet by mouth daily.     Blood Glucose Monitoring Suppl (BLOOD GLUCOSE METER KIT AND SUPPLIES) KIT Dispense based on patient and insurance preference. Use to check BG once daily.  ICD 10 code = E11.9 1 each 0   Calcium  Citrate-Vitamin D  (CALCIUM  + D PO) Take 1 tablet by mouth daily.     Cholecalciferol  (VITAMIN D3 ULTRA STRENGTH) 125 MCG (5000 UT) capsule Take 5,000 Units by mouth daily.     doxycycline  (VIBRA -TABS) 100 MG tablet Take 1 tablet (100 mg total) by mouth 2 (two) times daily.  1 po bid 20 tablet 0   furosemide  (LASIX ) 20 MG tablet TAKE ONE TABLET DAILY 30 tablet 5   Iron-Vitamins (GERITOL PO) Take by mouth.     levothyroxine  (SYNTHROID ) 50 MCG tablet TAKE ONE TABLET DAILY 90 tablet 2   MAGNESIUM  PO Take by mouth.     Misc Natural Products (JOINT HEALTH PO) Take 1 tablet by mouth daily.     Multiple Vitamins-Minerals (ZINC  PO) Take by mouth.     naproxen sodium (ALEVE) 220 MG tablet Take 220 mg by mouth daily.     olmesartan -hydrochlorothiazide  (BENICAR  HCT) 40-25 MG tablet Take 1 tablet by mouth daily. 90 tablet 1   ONETOUCH ULTRA test strip CHECK BLOOD SUGAR DAILY AS DIRECTED 100 strip 9   No current facility-administered medications for this visit.    Allergies  Allergen Reactions   Sulfa Antibiotics Anaphylaxis   Statins Other (See Comments)    One of them caused muscle pain   Tetanus Toxoids     Reports flu like symptoms and swelling at injection site only     REVIEW OF SYSTEMS:  [X]  denotes positive finding, [ ]  denotes negative finding Cardiac  Comments:  Chest pain or chest pressure:    Shortness of breath upon exertion:    Short of breath when lying flat:    Irregular heart rhythm:        Vascular    Pain in calf, thigh, or hip brought on by ambulation:    Pain in feet at night that wakes you up from your sleep:     Blood clot in your veins:    Leg swelling:         Pulmonary    Oxygen at home:    Productive cough:     Wheezing:          Neurologic    Sudden weakness in arms or legs:     Sudden numbness in arms or legs:     Sudden onset of difficulty speaking or slurred speech:    Temporary loss of vision in one eye:     Problems with dizziness:         Gastrointestinal    Blood in stool:     Vomited blood:         Genitourinary    Burning when urinating:     Blood in urine:        Psychiatric    Major depression:         Hematologic    Bleeding problems:    Problems with blood clotting too easily:        Skin    Rashes or ulcers:        Constitutional    Fever or chills:      PHYSICAL EXAMINATION:  Vitals:   09/14/23 1355  BP: 131/71  Pulse: 73  Resp: 16  Temp: (!) 97.4 F (36.3 C)  TempSrc: Temporal  SpO2: 97%  Weight: 197 lb 8 oz (89.6 kg)  Height: 5' 3 (1.6 m)    General:  WDWN in NAD; vital signs documented above Gait: Not observed HENT: WNL, normocephalic Pulmonary: normal non-labored breathing , without Rales, rhonchi,  wheezing Cardiac: regular HR,  Abdomen: soft, NT, no masses Skin: without rashes Vascular Exam/Pulses:  Right Left  Radial 2+ (normal) 2+ (normal)                       Extremities: without ischemic changes, without Gangrene ,  without cellulitis; without open wounds;  Significant edema in the left lower extremity.  1+ pitting due to some fibrosis.  She has lipodermatosclerosis on the medial aspect of the distal calf.  Swelling extends into the foot.  There is a buffalo hump as well as Stemmer sign Musculoskeletal: no muscle wasting or atrophy  Neurologic: A&O X 3;  No focal weakness or paresthesias are detected Psychiatric:  The pt has Normal affect.   Non-Invasive Vascular Imaging:     +--------------+---------+------+-----------+------------+-----------------  ----+  LEFT         Reflux NoRefluxReflux TimeDiameter cmsComments                                        Yes                                                  +--------------+---------+------+-----------+------------+-----------------  ----+  CFV                    yes   >1 second                                     +--------------+---------+------+-----------+------------+-----------------  ----+  FV mid        no                                                            +--------------+---------+------+-----------+------------+-----------------  ----+  Popliteal    no                                                            +--------------+---------+------+-----------+------------+-----------------  ----+  GSV at [facility]              yes    >500 ms      0.76                            +--------------+---------+------+-----------+------------+-----------------  ----+  GSV prox thigh          yes    >500 ms      0.68                            +--------------+---------+------+-----------+------------+-----------------  ----+  GSV mid thigh           yes    >500 ms      0.51                            +--------------+---------+------+-----------+------------+-----------------  ----+  GSV dist thigh          yes    >500 ms      0.52                            +--------------+---------+------+-----------+------------+-----------------  ----+  GSV at knee             yes    >500 ms      0.39                            +--------------+---------+------+-----------+------------+-----------------  ----+  GSV prox calf           yes    >500 ms      0.44                            +--------------+---------+------+-----------+------------+-----------------  ----+  GSV mid calf            yes    >500 ms      0.48                            +--------------+---------+------+-----------+------------+-----------------  ----+  SSV at [facility]    no                            0.25    posterior thigh                                                             extension  at  knee      +--------------+---------+------+-----------+------------+-----------------  ----+  SSV prox calf no                            0.26                            +--------------+---------+------+-----------+------------+-----------------  ----+  SSV mid calf  no                            0.25                            +--------------+---------+------+-----------+------------+-----------------  ----+     ASSESSMENT/PLAN:: 73 y.o. female presenting with secondary lymphedema from prior knee surgery, as well as primary venous insufficiency.  On physical exam, she has chronic skin changes with lipodermatosclerosis. CEAP4. Edema extends into the foot with a positive buffalo hump and Stemmer sign.  I had a long conversation with Rock regarding the above.  I think she would be best served with lower extremity compression stockings in an effort to manage the edema.  We discussed the importance of elevation and exercise as well.  While I do think that she will require lifelong compression for lymphedema secondary to knee replacement, I think that should she continue to have issues, she would be a candidate for venous ablation judging by the reflux and size of her greater saphenous vein.  My plan is to have her follow-up in 3 months with one of my partners who performs venous ablation to discuss her symptoms, and candidacy after compression stocking trial.    Jonael Paradiso E Haliyah Fryman, MD [facility] [phone]

## 2023-09-22 ENCOUNTER — Encounter (HOSPITAL_COMMUNITY): Payer: Self-pay

## 2023-09-22 ENCOUNTER — Ambulatory Visit (HOSPITAL_COMMUNITY)
Admission: RE | Admit: 2023-09-22 | Discharge: 2023-09-22 | Disposition: A | Discharge status: Home / Self Care | Source: Ambulatory Visit | Attending: Nurse Practitioner | Admitting: Nurse Practitioner

## 2023-09-22 DIAGNOSIS — Z1231 Encounter for screening mammogram for malignant neoplasm of breast: Secondary | ICD-10-CM | POA: Diagnosis not present

## 2023-10-03 ENCOUNTER — Encounter: Payer: Self-pay | Admitting: Nurse Practitioner

## 2023-10-03 ENCOUNTER — Ambulatory Visit: Payer: 59 | Admitting: Nurse Practitioner

## 2023-10-03 VITALS — BP 134/68 | HR 69 | Temp 97.6°F | Ht 63.0 in | Wt 199.0 lb

## 2023-10-03 DIAGNOSIS — I739 Peripheral vascular disease, unspecified: Secondary | ICD-10-CM | POA: Diagnosis not present

## 2023-10-03 DIAGNOSIS — E876 Hypokalemia: Secondary | ICD-10-CM | POA: Diagnosis not present

## 2023-10-03 DIAGNOSIS — E559 Vitamin D deficiency, unspecified: Secondary | ICD-10-CM | POA: Diagnosis not present

## 2023-10-03 DIAGNOSIS — E1159 Type 2 diabetes mellitus with other circulatory complications: Secondary | ICD-10-CM | POA: Diagnosis not present

## 2023-10-03 DIAGNOSIS — E1169 Type 2 diabetes mellitus with other specified complication: Secondary | ICD-10-CM | POA: Diagnosis not present

## 2023-10-03 DIAGNOSIS — E039 Hypothyroidism, unspecified: Secondary | ICD-10-CM | POA: Diagnosis not present

## 2023-10-03 DIAGNOSIS — Z0001 Encounter for general adult medical examination with abnormal findings: Secondary | ICD-10-CM

## 2023-10-03 DIAGNOSIS — R600 Localized edema: Secondary | ICD-10-CM

## 2023-10-03 DIAGNOSIS — E119 Type 2 diabetes mellitus without complications: Secondary | ICD-10-CM

## 2023-10-03 DIAGNOSIS — E785 Hyperlipidemia, unspecified: Secondary | ICD-10-CM | POA: Diagnosis not present

## 2023-10-03 DIAGNOSIS — I10 Essential (primary) hypertension: Secondary | ICD-10-CM

## 2023-10-03 DIAGNOSIS — M8588 Other specified disorders of bone density and structure, other site: Secondary | ICD-10-CM

## 2023-10-03 DIAGNOSIS — I152 Hypertension secondary to endocrine disorders: Secondary | ICD-10-CM | POA: Diagnosis not present

## 2023-10-03 DIAGNOSIS — Z0000 Encounter for general adult medical examination without abnormal findings: Secondary | ICD-10-CM

## 2023-10-03 DIAGNOSIS — Z6832 Body mass index (BMI) 32.0-32.9, adult: Secondary | ICD-10-CM

## 2023-10-03 LAB — BAYER DCA HB A1C WAIVED: HB A1C (BAYER DCA - WAIVED): 5.9 % — ABNORMAL HIGH (ref 4.8–5.6)

## 2023-10-03 LAB — LIPID PANEL

## 2023-10-03 MED ORDER — BLOOD GLUCOSE MONITORING SUPPL DEVI: 1.0000 | Freq: Three times a day (TID) | 0 refills | Status: AC

## 2023-10-03 MED ORDER — OLMESARTAN MEDOXOMIL-HCTZ 40-25 MG PO TABS: 1.0000 | ORAL_TABLET | Freq: Every day | ORAL | 1 refills | Status: AC

## 2023-10-03 MED ORDER — LEVOTHYROXINE SODIUM 50 MCG PO TABS: 50.0000 ug | ORAL_TABLET | Freq: Every day | ORAL | 1 refills | Status: AC

## 2023-10-03 MED ORDER — FUROSEMIDE 20 MG PO TABS: 20.0000 mg | ORAL_TABLET | Freq: Every day | ORAL | 1 refills | Status: DC

## 2023-10-03 MED ORDER — LANCETS MISC. MISC: 1.0000 | Freq: Three times a day (TID) | 0 refills | Status: AC

## 2023-10-03 MED ORDER — LANCET DEVICE MISC: 1.0000 | Freq: Three times a day (TID) | 0 refills | Status: AC

## 2023-10-03 MED ORDER — BLOOD GLUCOSE TEST VI STRP: 1.0000 | ORAL_STRIP | Freq: Three times a day (TID) | 0 refills | Status: AC

## 2023-10-03 NOTE — Progress Notes (Signed)
 Subjective:    Patient ID: [name], female    DOB: 1951/02/23, 73 y.o.   MRN: [id number]   Chief Complaint: annual physical exam    HPI:  Mckenzie Leonard is a 73 y.o. who identifies as a female who was assigned female at birth.   Social history: Lives with: by herself- family checks on her frequently Work history: retired   Water engineer in today for follow up of the following chronic medical issues:  1. Primary hypertension No c/o chest pain, sob or headache. Does not check blood pressure at home. BP Readings from Last 3 Encounters:  09/14/23 131/71  07/31/23 (!) 144/79  07/03/23 126/64     2. Hyperlipidemia associated with type 2 diabetes mellitus (HCC) Does not watch diet and does no dedicated exercise. She stopped taking statin due to myalgia.she also refuses repatha.  Lab Results  Component Value Date   CHOL 214 (H) 10/04/2022   HDL 44 10/04/2022   LDLCALC 131 (H) 10/04/2022   TRIG 221 (H) 10/04/2022   CHOLHDL 4.9 (H) 10/04/2022   The 10-year ASCVD risk score (Arnett DK, et al., 2019) is: 35.3%   3. PAD (peripheral artery disease) (HCC) Mainly in bil lower ext. Not bothering her currently  4. Hypothyroidism (acquired) No issues that she is aware of Lab Results  Component Value Date   TSH 2.340 10/04/2022     5. Diet-controlled diabetes mellitus (HCC) Fasting blood sugars are running around 120-140. No low blood sugars HGBA1c at [facility]- 03/02/23 6.1%   6. Hypokalemia No c/o muscle cramping Lab Results  Component Value Date   K 4.0 10/04/2022     7. Peripheral edema Has daily lower ext edema. Has not been taking lasix . Left leg more edema then right. Is seeing peripheral vascular and has follow up in November  8. Vitamin D  deficiency Daily vitamin d  supplement  9. Osteopenia of lumbar spine Last dexscan as done on 03/28/22. T score was -1.0  10. BMI 32.0-32.9,adult Weight is unchanged  Wt Readings from Last 3 Encounters:  10/03/23 199 lb  (90.3 kg)  09/14/23 197 lb 8 oz (89.6 kg)  07/31/23 199 lb (90.3 kg)   BMI Readings from Last 3 Encounters:  10/03/23 35.25 kg/m  09/14/23 34.99 kg/m  07/31/23 35.25 kg/m       New complaints: None  today Allergies  Allergen Reactions   Sulfa Antibiotics Anaphylaxis   Statins Other (See Comments)    One of them caused muscle pain   Tetanus Toxoids     Reports flu like symptoms and swelling at injection site only   Outpatient Encounter Medications as of 10/03/2023  Medication Sig   aspirin  EC 81 MG tablet Take 81 mg by mouth daily. Swallow whole.   BIOTIN PO Take 1 tablet by mouth daily.   Blood Glucose Monitoring Suppl (BLOOD GLUCOSE METER KIT AND SUPPLIES) KIT Dispense based on patient and insurance preference. Use to check BG once daily.  ICD 10 code = E11.9   Calcium  Citrate-Vitamin D  (CALCIUM  + D PO) Take 1 tablet by mouth daily.   Cholecalciferol  (VITAMIN D3 ULTRA STRENGTH) 125 MCG (5000 UT) capsule Take 5,000 Units by mouth daily.   doxycycline  (VIBRA -TABS) 100 MG tablet Take 1 tablet (100 mg total) by mouth 2 (two) times daily. 1 po bid   furosemide  (LASIX ) 20 MG tablet TAKE ONE TABLET DAILY   Iron-Vitamins (GERITOL PO) Take by mouth.   levothyroxine  (SYNTHROID ) 50 MCG tablet TAKE ONE TABLET DAILY  MAGNESIUM  PO Take by mouth.   Misc Natural Products (JOINT HEALTH PO) Take 1 tablet by mouth daily.   Multiple Vitamins-Minerals (ZINC  PO) Take by mouth.   naproxen sodium (ALEVE) 220 MG tablet Take 220 mg by mouth daily.   olmesartan -hydrochlorothiazide  (BENICAR  HCT) 40-25 MG tablet Take 1 tablet by mouth daily.   ONETOUCH ULTRA test strip CHECK BLOOD SUGAR DAILY AS DIRECTED   No facility-administered encounter medications on file as of 10/03/2023.    Past Surgical History:  Procedure Laterality Date   BREAST BIOPSY Right    negative   CARPAL TUNNEL RELEASE     Right   CLOSED REDUCTION MANDIBLE  02/20/2012   Procedure: CLOSED REDUCTION MANDIBULAR;  Surgeon:  Lonni LITTIE Sax, DDS;  Location: [facility];  Service: Oral Surgery;  Laterality: Right;  Closed reduction zygonach arch fx   COLONOSCOPY WITH PROPOFOL  N/A 10/25/2022   Procedure: COLONOSCOPY WITH PROPOFOL ;  Surgeon: Eartha Angelia Sieving, MD;  Location: [facility];  Service: Gastroenterology;  Laterality: N/A;  10:30am;asa 3   EXCISION MELANOMA WITH SENTINEL LYMPH NODE BIOPSY Left 05/24/2021   Procedure: MELANOMA WIDE LOCAL EXCISION;  Surgeon: Signe Mitzie LABOR, MD;  Location: [facility] OR;  Service: General;  Laterality: Left;   JOINT REPLACEMENT     KNEE CARTILAGE SURGERY Left    ORIF FACIAL FRACTURE  02/20/2012   Procedure: OPEN REDUCTION INTERNAL FIXATION (ORIF) MULTIPLE FACIAL FRACTURES;  Surgeon: Lonni LITTIE Sax, DDS;  Location: [facility] OR;  Service: Oral Surgery;  Laterality: Right;  ORIF Rt [facility] (zygomaxillary)   POLYPECTOMY  10/25/2022   Procedure: POLYPECTOMY;  Surgeon: Eartha Angelia Sieving, MD;  Location: [facility];  Service: Gastroenterology;;   SENTINEL NODE BIOPSY Left 05/24/2021   Procedure: SENTINEL NODE BIOPSY;  Surgeon: Signe Mitzie LABOR, MD;  Location: [facility] OR;  Service: General;  Laterality: Left;   TOTAL KNEE ARTHROPLASTY Left 10/12/2016   Procedure: TOTAL KNEE ARTHROPLASTY;  Surgeon: Margrette Taft BRAVO, MD;  Location: [facility] ORS;  Service: Orthopedics;  Laterality: Left;    Family History  Problem Relation Age of Onset   Arthritis Mother    Heart disease Mother        enlarged heart   Anemia Mother    Stroke Mother    Hearing loss Mother    Arthritis Father    Diabetes Father    Hypertension Father    Cancer Sister        breast   Diabetes Brother    CAD Brother    Healthy Daughter    Healthy Daughter       Controlled substance contract: n/a      Review of Systems  Constitutional:  Negative for diaphoresis.  Eyes:  Negative for pain.  Respiratory:  Negative for shortness of breath.   Cardiovascular:  Negative for chest pain, palpitations and leg  swelling.  Gastrointestinal:  Negative for abdominal pain.  Endocrine: Negative for polydipsia.  Skin:  Negative for rash.  Neurological:  Negative for dizziness, weakness and headaches.  Hematological:  Does not bruise/bleed easily.  All other systems reviewed and are negative.      Objective:   Physical Exam Vitals and nursing note reviewed.  Constitutional:      General: She is not in acute distress.    Appearance: Normal appearance. She is well-developed.  HENT:     Head: Normocephalic.     Right Ear: Tympanic membrane normal.     Left Ear: Tympanic membrane normal.     Nose:  Nose normal.     Mouth/Throat:     Mouth: Mucous membranes are moist.  Eyes:     Pupils: Pupils are equal, round, and reactive to light.  Neck:     Vascular: No carotid bruit or JVD.  Cardiovascular:     Rate and Rhythm: Normal rate and regular rhythm.     Heart sounds: Normal heart sounds.  Pulmonary:     Effort: Pulmonary effort is normal. No respiratory distress.     Breath sounds: Normal breath sounds. No wheezing or rales.  Chest:     Chest wall: No tenderness.  Abdominal:     General: Bowel sounds are normal. There is no distension or abdominal bruit.     Palpations: Abdomen is soft. There is no hepatomegaly, splenomegaly, mass or pulsatile mass.     Tenderness: There is no abdominal tenderness.  Musculoskeletal:        General: Normal range of motion.     Cervical back: Normal range of motion and neck supple.     Right lower leg: Edema (1+) present.     Left lower leg: Edema (2+) present.  Lymphadenopathy:     Cervical: No cervical adenopathy.  Skin:    General: Skin is warm and dry.     Coloration: Skin is pale.  Neurological:     Mental Status: She is alert and oriented to person, place, and time.     Deep Tendon Reflexes: Reflexes are normal and symmetric.  Psychiatric:        Behavior: Behavior normal.        Thought Content: Thought content normal.        Judgment: Judgment  normal.    BP 134/68   Pulse 69   Temp 97.6 F (36.4 C) (Temporal)   Ht 5' 3 (1.6 m)   Wt 199 lb (90.3 kg)   SpO2 96%   BMI 35.25 kg/m     HGBA1c 5.9%       Assessment & Plan:  LEOCADIA IDLEMAN comes in today with chief complaint of annual physical  Diagnosis and orders addressed:  1. Primary hypertension Low sodium diet - CBC with Differential/Platelet - CMP14+EGFR - olmesartan -hydrochlorothiazide  (BENICAR  HCT) 40-25 MG tablet; Take 1 tablet by mouth daily.  Dispense: 90 tablet; Refill: 1  2. Hyperlipidemia associated with type 2 diabetes mellitus (HCC) Low fat diet - Lipid panel  3. Statin intolerance Strict low fat diet  4. PAD (peripheral artery disease) (HCC) Keep follow up with peripheral vascular surgeon  5. Hypothyroidism (acquired) Labs pending - levothyroxine  (SYNTHROID ) 50 MCG tablet; Take 1 tablet (50 mcg total) by mouth daily.  Dispense: 90 tablet; Refill: 0  6. Diet-controlled diabetes mellitus (HCC) Continue to watch carbs in diet - Bayer DCA Hb A1c Waived - Microalbumin / creatinine urine ratio  7. Hypokalemia Labs pending  8. Peripheral edema Elevate legs when sitting Compression socks Need to take lasix  - furosemide  (LASIX ) 20 MG tablet; Take 1 tablet (20 mg total) by mouth daily.  Dispense: 30 tablet; Refill: 3  9. Vitamin D  deficiency Continue vitamin d  supplement  10. Osteopenia of lumbar spine Weight bearing exercises encouraged  11. BMI 32.0-32.9,adult Discussed diet and exercise for person with BMI >25 Will recheck weight in 3-6 months    Labs reveiwed from hospital at appointment Health Maintenance reviewed Diet and exercise encouraged  Follow up plan: 6 months   Mary-Margaret Gladis, FNP

## 2023-10-03 NOTE — Patient Instructions (Signed)
 Peripheral Edema  Peripheral edema is swelling that is caused by a buildup of fluid. Peripheral edema most often affects the lower legs, ankles, and feet. It can also develop in the arms, hands, and face. The area of the body that has peripheral edema will look swollen. It may also feel heavy or warm. Your clothes may start to feel tight. Pressing on the area may make a temporary dent in your skin (pitting edema). You may not be able to move your swollen arm or leg as much as usual. There are many causes of peripheral edema. It can happen because of a complication of other conditions such as heart failure, kidney disease, or a problem with your circulation. It also can be a side effect of certain medicines or happen because of an infection. It often happens to women during pregnancy. Sometimes, the cause is not known. Follow these instructions at home: Managing pain, stiffness, and swelling  Raise (elevate) your legs while you are sitting or lying down. Move around often to prevent stiffness and to reduce swelling. Do not sit or stand for long periods of time. Do not wear tight clothing. Do not wear garters on your upper legs. Exercise your legs to get your circulation going. This helps to move the fluid back into your blood vessels, and it may help the swelling go down. Wear compression stockings as told by your health care provider. These stockings help to prevent blood clots and reduce swelling in your legs. It is important that these are the correct size. These stockings should be prescribed by your doctor to prevent possible injuries. If elastic bandages or wraps are recommended, use them as told by your health care provider. Medicines Take over-the-counter and prescription medicines only as told by your health care provider. Your health care provider may prescribe medicine to help your body get rid of excess water (diuretic). Take this medicine if you are told to take it. General  instructions Eat a low-salt (low-sodium) diet as told by your health care provider. Sometimes, eating less salt may reduce swelling. Pay attention to any changes in your symptoms. Moisturize your skin daily to help prevent skin from cracking and draining. Keep all follow-up visits. This is important. Contact a health care provider if: You have a fever. You have swelling in only one leg. You have increased swelling, redness, or pain in one or both of your legs. You have drainage or sores at the area where you have edema. Get help right away if: You have edema that starts suddenly or is getting worse, especially if you are pregnant or have a medical condition. You develop shortness of breath, especially when you are lying down. You have pain in your chest or abdomen. You feel weak. You feel like you will faint. These symptoms may be an emergency. Get help right away. Call 911. Do not wait to see if the symptoms will go away. Do not drive yourself to the hospital. Summary Peripheral edema is swelling that is caused by a buildup of fluid. Peripheral edema most often affects the lower legs, ankles, and feet. Move around often to prevent stiffness and to reduce swelling. Do not sit or stand for long periods of time. Pay attention to any changes in your symptoms. Contact a health care provider if you have edema that starts suddenly or is getting worse, especially if you are pregnant or have a medical condition. Get help right away if you develop shortness of breath, especially when lying down.  This information is not intended to replace advice given to you by your health care provider. Make sure you discuss any questions you have with your health care provider. Document Revised: 10/19/2020 Document Reviewed: 10/19/2020 Elsevier Patient Education  2024 [employer].

## 2023-10-04 LAB — CMP14+EGFR
ALT: 18 IU/L (ref 0–32)
AST: 20 IU/L (ref 0–40)
Albumin: 4.5 g/dL (ref 3.8–4.8)
Alkaline Phosphatase: 81 IU/L (ref 44–121)
BUN/Creatinine Ratio: 19 (ref 12–28)
BUN: 15 mg/dL (ref 8–27)
Bilirubin Total: 1 mg/dL (ref 0.0–1.2)
CO2: 22 mmol/L (ref 20–29)
Calcium: 9.2 mg/dL (ref 8.7–10.3)
Chloride: 98 mmol/L (ref 96–106)
Creatinine, Ser: 0.8 mg/dL (ref 0.57–1.00)
Globulin, Total: 2.6 g/dL (ref 1.5–4.5)
Glucose: 109 mg/dL — AB (ref 70–99)
Potassium: 3.8 mmol/L (ref 3.5–5.2)
Sodium: 139 mmol/L (ref 134–144)
Total Protein: 7.1 g/dL (ref 6.0–8.5)
eGFR: 77 mL/min/1.73 (ref >59)

## 2023-10-04 LAB — LIPID PANEL
Cholesterol, Total: 215 mg/dL — AB (ref 100–199)
HDL: 43 mg/dL (ref >39)
LDL CALC COMMENT:: 5 ratio — AB (ref 0.0–4.4)
LDL Chol Calc (NIH): 132 mg/dL — AB (ref 0–99)
Triglycerides: 227 mg/dL — AB (ref 0–149)
VLDL Cholesterol Cal: 40 mg/dL (ref 5–40)

## 2023-10-04 LAB — CBC WITH DIFFERENTIAL/PLATELET
Basophils Absolute: 0 x10E3/uL (ref 0.0–0.2)
Basos: 1 %
EOS (ABSOLUTE): 0.2 x10E3/uL (ref 0.0–0.4)
Eos: 4 %
Hematocrit: 37.7 % (ref 34.0–46.6)
Hemoglobin: 12.1 g/dL (ref 11.1–15.9)
Immature Grans (Abs): 0 x10E3/uL (ref 0.0–0.1)
Immature Granulocytes: 0 %
Lymphocytes Absolute: 1.3 x10E3/uL (ref 0.7–3.1)
Lymphs: 19 %
MCH: 29 pg (ref 26.6–33.0)
MCHC: 32.1 g/dL (ref 31.5–35.7)
MCV: 90 fL (ref 79–97)
Monocytes Absolute: 0.5 x10E3/uL (ref 0.1–0.9)
Monocytes: 7 %
Neutrophils Absolute: 4.6 x10E3/uL (ref 1.4–7.0)
Neutrophils: 69 %
Platelets: 260 x10E3/uL (ref 150–450)
RBC: 4.17 x10E6/uL (ref 3.77–5.28)
RDW: 14.6 % (ref 11.7–15.4)
WBC: 6.7 x10E3/uL (ref 3.4–10.8)

## 2023-10-05 ENCOUNTER — Ambulatory Visit: Payer: Self-pay | Admitting: Nurse Practitioner

## 2023-10-06 NOTE — Telephone Encounter (Signed)
 Per CRM, E2C2 Rep reviewed results with patient.  Patient stated she will not be taking the repath.

## 2023-10-09 ENCOUNTER — Telehealth: Payer: Self-pay | Admitting: Family Medicine

## 2023-10-09 ENCOUNTER — Ambulatory Visit: Payer: 59

## 2023-10-09 VITALS — BP 134/68 | HR 69 | Ht 63.0 in | Wt 199.0 lb

## 2023-10-09 DIAGNOSIS — Z0000 Encounter for general adult medical examination without abnormal findings: Secondary | ICD-10-CM

## 2023-10-09 DIAGNOSIS — R600 Localized edema: Secondary | ICD-10-CM

## 2023-10-09 NOTE — Progress Notes (Signed)
 Subjective:   Mckenzie Leonard is a 73 y.o. who presents for a Medicare Wellness preventive visit.  As a reminder, Annual Wellness Visits don't include a physical exam, and some assessments may be limited, especially if this visit is performed virtually. We may recommend an in-person follow-up visit with your provider if needed.  Visit Complete: Virtual I connected with  [name] on 10/09/23 by a audio enabled telemedicine application and verified that I am speaking with the correct person using two identifiers.  Patient Location: Home  Provider Location: Home Office  I discussed the limitations of evaluation and management by telemedicine. The patient expressed understanding and agreed to proceed.  Vital Signs: Because this visit was a virtual/telehealth visit, some criteria may be missing or patient reported. Any vitals not documented were not able to be obtained and vitals that have been documented are patient reported.  VideoDeclined- This patient declined Librarian, academic. Therefore the visit was completed with audio only.  Persons Participating in Visit: Patient.  AWV Questionnaire: No: Patient Medicare AWV questionnaire was not completed prior to this visit.  Cardiac Risk Factors include: advanced age (>72men, >40 women);dyslipidemia;hypertension;obesity (BMI >30kg/m2)     Objective:    Today's Vitals   10/09/23 1250  BP: 134/68  Pulse: 69  Weight: 199 lb (90.3 kg)  Height: 5' 3 (1.6 m)   Body mass index is 35.25 kg/m.     10/09/2023   12:54 PM 10/20/2022   11:08 AM 10/07/2022    8:17 AM 09/13/2022   11:29 AM 10/05/2021    9:57 AM 05/14/2021   10:50 AM 10/02/2020   10:12 AM  Advanced Directives  Does Patient Have a Medical Advance Directive? No No No No No No No  Would patient like information on creating a medical advance directive?  No - Patient declined Yes (MAU/Ambulatory/Procedural Areas - Information given) No - Patient declined  No - Patient declined No - Patient declined No - Patient declined    Current Medications (verified) Outpatient Encounter Medications as of 10/09/2023  Medication Sig   Blood Glucose Monitoring Suppl (BLOOD GLUCOSE METER KIT AND SUPPLIES) KIT Dispense based on patient and insurance preference. Use to check BG once daily.  ICD 10 code = E11.9   Blood Glucose Monitoring Suppl DEVI 1 each by Does not apply route in the morning, at noon, and at bedtime. May substitute to any manufacturer covered by patient's insurance.   Calcium  Citrate-Vitamin D  (CALCIUM  + D PO) Take 1 tablet by mouth daily.   Cholecalciferol  (VITAMIN D3 ULTRA STRENGTH) 125 MCG (5000 UT) capsule Take 5,000 Units by mouth daily.   furosemide  (LASIX ) 20 MG tablet Take 1 tablet (20 mg total) by mouth daily.   Glucose Blood (BLOOD GLUCOSE TEST STRIPS) STRP 1 each by In Vitro route in the morning, at noon, and at bedtime. May substitute to any manufacturer covered by patient's insurance.   Iron-Vitamins (GERITOL PO) Take by mouth.   Lancet Device MISC 1 each by Does not apply route in the morning, at noon, and at bedtime. May substitute to any manufacturer covered by patient's insurance.   Lancets Misc. MISC 1 each by Does not apply route in the morning, at noon, and at bedtime. May substitute to any manufacturer covered by patient's insurance.   levothyroxine  (SYNTHROID ) 50 MCG tablet Take 1 tablet (50 mcg total) by mouth daily.   MAGNESIUM  PO Take by mouth.   Misc Natural Products (JOINT HEALTH PO) Take 1  tablet by mouth daily.   Multiple Vitamins-Minerals (ZINC  PO) Take by mouth.   naproxen sodium (ALEVE) 220 MG tablet Take 220 mg by mouth daily.   olmesartan -hydrochlorothiazide  (BENICAR  HCT) 40-25 MG tablet Take 1 tablet by mouth daily.   ONETOUCH ULTRA test strip CHECK BLOOD SUGAR DAILY AS DIRECTED   No facility-administered encounter medications on file as of 10/09/2023.    Allergies (verified) Sulfa antibiotics, Statins, and  Tetanus toxoids   History: Past Medical History:  Diagnosis Date   Arthritis    Diabetes mellitus without complication (HCC)    High cholesterol    Hypertension    Hypothyroidism    PONV (postoperative nausea and vomiting)    Thyroid  disease    Past Surgical History:  Procedure Laterality Date   BREAST BIOPSY Right    negative   CARPAL TUNNEL RELEASE     Right   CLOSED REDUCTION MANDIBLE  02/20/2012   Procedure: CLOSED REDUCTION MANDIBULAR;  Surgeon: Lonni LITTIE Sax, DDS;  Location: [facility] OR;  Service: Oral Surgery;  Laterality: Right;  Closed reduction zygonach arch fx   COLONOSCOPY WITH PROPOFOL  N/A 10/25/2022   Procedure: COLONOSCOPY WITH PROPOFOL ;  Surgeon: Eartha Angelia Sieving, MD;  Location: [facility];  Service: Gastroenterology;  Laterality: N/A;  10:30am;asa 3   EXCISION MELANOMA WITH SENTINEL LYMPH NODE BIOPSY Left 05/24/2021   Procedure: MELANOMA WIDE LOCAL EXCISION;  Surgeon: Signe Mitzie LABOR, MD;  Location: [facility] OR;  Service: General;  Laterality: Left;   JOINT REPLACEMENT     KNEE CARTILAGE SURGERY Left    ORIF FACIAL FRACTURE  02/20/2012   Procedure: OPEN REDUCTION INTERNAL FIXATION (ORIF) MULTIPLE FACIAL FRACTURES;  Surgeon: Lonni LITTIE Sax, DDS;  Location: [facility] OR;  Service: Oral Surgery;  Laterality: Right;  ORIF Rt [facility] (zygomaxillary)   POLYPECTOMY  10/25/2022   Procedure: POLYPECTOMY;  Surgeon: Eartha Angelia Sieving, MD;  Location: [facility];  Service: Gastroenterology;;   SENTINEL NODE BIOPSY Left 05/24/2021   Procedure: SENTINEL NODE BIOPSY;  Surgeon: Signe Mitzie LABOR, MD;  Location: [facility] OR;  Service: General;  Laterality: Left;   TOTAL KNEE ARTHROPLASTY Left 10/12/2016   Procedure: TOTAL KNEE ARTHROPLASTY;  Surgeon: Margrette Taft BRAVO, MD;  Location: [facility] ORS;  Service: Orthopedics;  Laterality: Left;   Family History  Problem Relation Age of Onset   Arthritis Mother    Heart disease Mother        enlarged heart   Anemia Mother    Stroke  Mother    Hearing loss Mother    Arthritis Father    Diabetes Father    Hypertension Father    Cancer Sister        breast   Diabetes Brother    CAD Brother    Healthy Daughter    Healthy Daughter    Social History   Socioeconomic History   Marital status: Divorced    Spouse name: Not on file   Number of children: 2   Years of education: 10   Highest education level: GED or equivalent  Occupational History   Occupation: retired    Comment: Designer, fashion/clothing  Tobacco Use   Smoking status: Never   Smokeless tobacco: Never  Vaping Use   Vaping status: Never Used  Substance and Sexual Activity   Alcohol use: No   Drug use: No   Sexual activity: Yes    Birth control/protection: Post-menopausal  Other Topics Concern   Not on file  Social History Narrative   Spends lots of  time with her daughters and sister, goes to church and senior center frequently; stays on the go   Social Drivers of Health   Financial Resource Strain: Low Risk  (10/09/2023)   Overall Financial Resource Strain (CARDIA)    Difficulty of Paying Living Expenses: Not hard at all  Food Insecurity: No Food Insecurity (10/09/2023)   Hunger Vital Sign    Worried About Running Out of Food in the Last Year: Never true    Ran Out of Food in the Last Year: Never true  Transportation Needs: No Transportation Needs (10/09/2023)   PRAPARE - Administrator, Civil Service (Medical): No    Lack of Transportation (Non-Medical): No  Physical Activity: Insufficiently Active (10/09/2023)   Exercise Vital Sign    Days of Exercise per Week: 3 days    Minutes of Exercise per Session: 20 min  Stress: No Stress Concern Present (10/09/2023)   [employer] of Occupational Health - Occupational Stress Questionnaire    Feeling of Stress: Not at all  Social Connections: Moderately Isolated (10/09/2023)   Social Connection and Isolation Panel    Frequency of Communication with Friends and Family: More than three times a week     Frequency of Social Gatherings with Friends and Family: More than three times a week    Attends Religious Services: More than 4 times per year    Active Member of [employer] or Organizations: No    Attends Engineer, structural: Never    Marital Status: Divorced    Tobacco Counseling Counseling given: Yes    Clinical Intake:  Pre-visit preparation completed: Yes  Pain : No/denies pain     BMI - recorded: 35.25 Nutritional Status: BMI 25 -29 Overweight Nutritional Risks: None Diabetes: No  Lab Results  Component Value Date   HGBA1C 5.9 (H) 10/03/2023   HGBA1C 6.3 (H) 10/04/2022   HGBA1C 6.3 (H) 03/28/2022     How often do you need to have someone help you when you read instructions, pamphlets, or other written materials from your doctor or pharmacy?: 1 - Never  Interpreter Needed?: No  Information entered by :: alia t/cma   Activities of Daily Living     10/09/2023   12:54 PM 10/20/2022   11:12 AM  In your present state of health, do you have any difficulty performing the following activities:  Hearing? 0   Vision? 0   Difficulty concentrating or making decisions? 0   Walking or climbing stairs? 0   Dressing or bathing? 0   Doing errands, shopping? 0 0  Preparing Food and eating ? N   Using the Toilet? N   In the past six months, have you accidently leaked urine? N   Do you have problems with loss of bowel control? N   Managing your Medications? N   Managing your Finances? N   Housekeeping or managing your Housekeeping? N     Patient Care Team: Gladis Mustard, FNP as PCP - General (Nurse Practitioner) Margrette Taft BRAVO, MD as Consulting Physician (Orthopedic Surgery) Vicci Mcardle, OD (Optometry)  I have updated your Care Teams any recent Medical Services you may have received from other providers in the past year.     Assessment:   This is a routine wellness examination for [place].  Hearing/Vision screen Hearing Screening -  Comments:: Pt denies hearing dif Vision Screening - Comments:: Pt wear glasses/pt [facility] In [facility], Dr. Quinton ov 08/2023   Goals Addressed  This Visit's Progress    Patient Stated       Pt would like to cut down on drinking her sodas       Depression Screen     10/09/2023   12:56 PM 10/03/2023    7:56 AM 07/31/2023    9:22 AM 07/31/2023    9:21 AM 04/06/2023    8:19 AM 10/07/2022    8:15 AM 10/04/2022    8:14 AM  PHQ 2/9 Scores  PHQ - 2 Score 0 0 0 0 0 0 0  PHQ- 9 Score      0 0    Fall Risk     10/09/2023   12:52 PM 10/03/2023    7:56 AM 07/31/2023    9:22 AM 04/06/2023    8:19 AM 10/07/2022    8:14 AM  Fall Risk   Falls in the past year? 0 0 0 0 0  Number falls in past yr: 0    0  Injury with Fall? 0    0  Risk for fall due to : No Fall Risks    No Fall Risks  Follow up Falls evaluation completed    Falls prevention discussed    MEDICARE RISK AT HOME:  Medicare Risk at Home Any stairs in or around the home?: Yes If so, are there any without handrails?: Yes Home free of loose throw rugs in walkways, pet beds, electrical cords, etc?: Yes Adequate lighting in your home to reduce risk of falls?: Yes Life alert?: No Use of a cane, walker or w/c?: No Grab bars in the bathroom?: No Shower chair or bench in shower?: No Elevated toilet seat or a handicapped toilet?: Yes  TIMED UP AND GO:  Was the test performed?  no  Cognitive Function: 6CIT completed    08/30/2017    9:02 AM 08/29/2016    3:02 PM  MMSE - Mini Mental State Exam  Orientation to time 5 5   Orientation to Place 5 5   Registration 3 3   Attention/ Calculation 5 5   Recall 3 3   Language- name 2 objects 2 2   Language- repeat 1 1  Language- follow 3 step command 3 3   Language- read & follow direction 1 1   Write a sentence 1 1   Copy design 1 1   Total score 30 30      Data saved with a previous flowsheet row definition        10/09/2023   12:54 PM 10/07/2022    8:17 AM  10/05/2021    9:55 AM 10/02/2019    9:45 AM 09/03/2018   10:00 AM  6CIT Screen  What Year? 0 points 0 points 0 points 0 points 0 points  What month? 0 points 0 points 0 points 0 points 0 points  What time? 0 points 0 points 0 points 0 points 0 points  Count back from 20 0 points 0 points 0 points 0 points 0 points  Months in reverse 0 points 0 points 0 points 0 points 0 points  Repeat phrase 0 points 0 points 0 points 0 points 0 points  Total Score 0 points 0 points 0 points 0 points 0 points    Immunizations Immunization History  Administered Date(s) Administered   Tdap 02/18/2012    Screening Tests Health Maintenance  Topic Date Due   Diabetic kidney evaluation - Urine ACR  09/25/2022   COVID-19 Vaccine (1) 10/19/2023 (Originally 11/09/1955)   Zoster  Vaccines- Shingrix (1 of 2) 01/03/2024 (Originally 11/10/1969)   DTaP/Tdap/Td (2 - Td or Tdap) 01/31/2024 (Originally 02/17/2022)   COLON CANCER SCREENING ANNUAL FOBT  04/04/2024 (Originally 08/04/2020)   INFLUENZA VACCINE  05/28/2024 (Originally 09/29/2023)   Pneumococcal Vaccine: 50+ Years (1 of 2 - PCV) 10/02/2024 (Originally 11/10/1969)   DEXA SCAN  03/28/2024   HEMOGLOBIN A1C  04/04/2024   FOOT EXAM  04/05/2024   OPHTHALMOLOGY EXAM  09/04/2024   Diabetic kidney evaluation - eGFR measurement  10/02/2024   Medicare Annual Wellness (AWV)  10/08/2024   MAMMOGRAM  09/21/2025   Colonoscopy  10/24/2029   Hepatitis C Screening  Completed   Hepatitis B Vaccines  Aged Out   HPV VACCINES  Aged Out   Meningococcal B Vaccine  Aged Out    Health Maintenance  Health Maintenance Due  Topic Date Due   Diabetic kidney evaluation - Urine ACR  09/25/2022   Health Maintenance Items Addressed: See Nurse Notes at the end of this note  Additional Screening:  Vision Screening: Recommended annual ophthalmology exams for early detection of glaucoma and other disorders of the eye. Would you like a referral to an eye doctor? No    Dental  Screening: Recommended annual dental exams for proper oral hygiene  Community Resource Referral / Chronic Care Management: CRR required this visit?  No   CCM required this visit?  No   Plan:    I have personally reviewed and noted the following in the patient's chart:   Medical and social history Use of alcohol, tobacco or illicit drugs  Current medications and supplements including opioid prescriptions. Patient is not currently taking opioid prescriptions. Functional ability and status Nutritional status Physical activity Advanced directives List of other physicians Hospitalizations, surgeries, and ER visits in previous 12 months Vitals Screenings to include cognitive, depression, and falls Referrals and appointments  In addition, I have reviewed and discussed with patient certain preventive protocols, quality metrics, and best practice recommendations. A written personalized care plan for preventive services as well as general preventive health recommendations were provided to patient.   Ozie Ned, CMA   10/09/2023   After Visit Summary: (MyChart) Due to this being a telephonic visit, the after visit summary with patients personalized plan was offered to patient via MyChart   Notes: Nothing significant to report at this time.

## 2023-10-09 NOTE — Patient Instructions (Addendum)
 [name] , Thank you for taking time out of your busy schedule to complete your Annual Wellness Visit with me. I enjoyed our conversation and look forward to speaking with you again next year. I, as well as your care team,  appreciate your ongoing commitment to your health goals. Please review the following plan we discussed and let me know if I can assist you in the future. Your Game plan/ To Do List    Referrals: If you haven't heard from the office you've been referred to, please reach out to them at the phone provided.   Follow up Visits: We will see or speak with you next year for your Next Medicare AWV with our clinical staff on 10/09/24 at 12:30p.m. Have you seen your provider in the last 6 months (3 months if uncontrolled diabetes)? Yes  Clinician Recommendations:  Aim for 30 minutes of exercise or brisk walking, 6-8 glasses of water , and 5 servings of fruits and vegetables each day.       This is a list of the screenings recommended for you:  Health Maintenance  Topic Date Due   Yearly kidney health urinalysis for diabetes  09/25/2022   Medicare Annual Wellness Visit  10/07/2023   COVID-19 Vaccine (1) 10/19/2023*   Zoster (Shingles) Vaccine (1 of 2) 01/03/2024*   DTaP/Tdap/Td vaccine (2 - Td or Tdap) 01/31/2024*   Stool Blood Test  04/04/2024*   Flu Shot  05/28/2024*   Pneumococcal Vaccine for age over 41 (1 of 2 - PCV) 10/02/2024*   DEXA scan (bone density measurement)  03/28/2024   Hemoglobin A1C  04/04/2024   Complete foot exam   04/05/2024   Eye exam for diabetics  09/04/2024   Yearly kidney function blood test for diabetes  10/02/2024   Mammogram  09/21/2025   Colon Cancer Screening  10/24/2029   Hepatitis C Screening  Completed   Hepatitis B Vaccine  Aged Out   HPV Vaccine  Aged Out   Meningitis B Vaccine  Aged Out  *Topic was postponed. The date shown is not the original due date.    Advanced directives: (Declined) Advance directive discussed with you today.  Even though you declined this today, please call our office should you change your mind, and we can give you the proper paperwork for you to fill out. Advance Care Planning is importnt because it:  [x]  Makes sure you receive the medical care that is consistent with your values, goals, and preferences  [x]  It provides guidance to your family and loved ones and reduces their decisional burden about whether or not they are making the right decisions based on your wishes.  Follow the link provided in your after visit summary or read over the paperwork we have mailed to you to help you started getting your Advance Directives in place. If you need assistance in completing these, please reach out to us  so that we can help you!  See attachments for Preventive Care and Fall Prevention Tips.

## 2023-10-09 NOTE — Telephone Encounter (Signed)
 Copied from CRM [id number]. Topic: Clinical - Prescription Issue >> Oct 09, 2023  1:29 PM Avram MATSU wrote: Reason for CRM: pt stated she talked to her provider about taking furosemide  (LASIX ) 20 MG tablet [[id number]] 2 times a day but only received 30 pills. She picked up the medication today. Please call pt back with more information [phone]

## 2023-10-10 MED ORDER — FUROSEMIDE 20 MG PO TABS: 20.0000 mg | ORAL_TABLET | Freq: Two times a day (BID) | ORAL | 1 refills | Status: AC

## 2023-10-10 NOTE — Telephone Encounter (Signed)
 Called and left voicemail for patient stating that the medication was resent to pharmacy for correct amount

## 2023-11-06 ENCOUNTER — Other Ambulatory Visit (INDEPENDENT_AMBULATORY_CARE_PROVIDER_SITE_OTHER): Admitting: Pharmacist

## 2023-11-06 NOTE — Progress Notes (Signed)
 Pharmacy Quality Measure Review  This patient is appearing on a report for being at risk of failing the Glycemic Status Assessment in Diabetes measure this calendar year.   Last documented A1c 5.9% on 10/03/23.   A1c controlled. No action needed at this time.   Catie IVAR Centers, PharmD, [facility] Clinical Pharmacist [phone]

## 2023-11-06 NOTE — Patient Instructions (Signed)
 SABRA

## 2023-12-13 ENCOUNTER — Encounter (INDEPENDENT_AMBULATORY_CARE_PROVIDER_SITE_OTHER): Payer: Self-pay | Admitting: Gastroenterology

## 2023-12-18 ENCOUNTER — Encounter: Payer: Self-pay | Admitting: Family Medicine

## 2023-12-18 ENCOUNTER — Ambulatory Visit: Admitting: Family Medicine

## 2023-12-18 VITALS — BP 164/78 | HR 97 | Temp 97.2°F | Ht 63.0 in | Wt 199.0 lb

## 2023-12-18 DIAGNOSIS — K111 Hypertrophy of salivary gland: Secondary | ICD-10-CM

## 2023-12-18 MED ORDER — CEPHALEXIN 500 MG PO CAPS: 500.0000 mg | ORAL_CAPSULE | Freq: Four times a day (QID) | ORAL | 0 refills | Status: DC

## 2023-12-18 NOTE — Progress Notes (Signed)
 BP (!) 164/78   Pulse 97   Temp (!) 97.2 F (36.2 C)   Ht 5' 3 (1.6 m)   Wt 199 lb (90.3 kg)   SpO2 98%   BMI 35.25 kg/m    Subjective:   Patient ID: Mckenzie Leonard, female    DOB: 10-12-1950, 73 y.o.   MRN: [id number]  HPI: Mckenzie Leonard is a 73 y.o. female presenting on 12/18/2023 for Facial Swelling (Right sided. Pt felt pain in right jaw line while eating lunch today. The jaw started to swell. Pain has improved.)   Discussed the use of AI scribe software for clinical note transcription with the patient, who gave verbal consent to proceed.  History of Present Illness   Mckenzie Leonard is a 73 year old female who presents with swelling and pain on the right side of her face. She is accompanied by her daughter, Devere Schiller.  Right facial swelling and pain - Acute onset of swelling and pain on the right side of the face today - Pain initially started lower on the face and migrated upward toward the area anterior to the right ear - Rapid development of a palpable knot in the affected area - No recent trauma or falls to the area - No recent dental problems, dry mouth, excessive saliva, or ear pain - No recent changes in diet - No recent cough, cold, runny nose, fever, chills, or flu-like symptoms  History of facial hardware - History of plate placement across the face due to a work-related injury in 2013 - No recent issues or complications related to the facial hardware  Oral hygiene - Brushes teeth twice daily - Has not seen a dentist in a long time  Medication and anticoagulation status - No recent changes in medications - Not taking any blood thinners  Relevant medical history - History of thyroid  disease - Hypertension - History of melanoma on the back approximately 3-4 years ago - Continues regular skin checks with no new skin issues - No history of breast cancer          Relevant past medical, surgical, family and social history reviewed and updated as  indicated. Interim medical history since our last visit reviewed. Allergies and medications reviewed and updated.  Review of Systems  Constitutional:  Negative for chills and fever.  HENT:  Positive for facial swelling. Negative for congestion, ear discharge and ear pain.   Eyes:  Negative for redness and visual disturbance.  Respiratory:  Negative for chest tightness and shortness of breath.   Cardiovascular:  Negative for chest pain and leg swelling.  Musculoskeletal:  Negative for back pain and gait problem.  Skin:  Negative for rash.  Neurological:  Negative for light-headedness and headaches.  Psychiatric/Behavioral:  Negative for agitation and behavioral problems.   All other systems reviewed and are negative.   Per HPI unless specifically indicated above   Allergies as of 12/18/2023       Reactions   Sulfa Antibiotics Anaphylaxis   Statins Other (See Comments)   One of them caused muscle pain   Tetanus Toxoid-containing Vaccines    Reports flu like symptoms and swelling at injection site only        Medication List        Accurate as of December 18, 2023  3:01 PM. If you have any questions, ask your nurse or doctor.          blood glucose meter kit and supplies Kit Dispense  based on patient and insurance preference. Use to check BG once daily.  ICD 10 code = E11.9   Blood Glucose Monitoring Suppl Devi 1 each by Does not apply route in the morning, at noon, and at bedtime. May substitute to any manufacturer covered by patient's insurance.   CALCIUM  + D PO Take 1 tablet by mouth daily.   cephALEXin 500 MG capsule Commonly known as: KEFLEX Take 1 capsule (500 mg total) by mouth 4 (four) times daily. Started by: Fonda LABOR Tinya Cadogan   furosemide  20 MG tablet Commonly known as: LASIX  Take 1 tablet (20 mg total) by mouth 2 (two) times daily.   GERITOL PO Take by mouth.   JOINT HEALTH PO Take 1 tablet by mouth daily.   levothyroxine  50 MCG  tablet Commonly known as: SYNTHROID  Take 1 tablet (50 mcg total) by mouth daily.   MAGNESIUM  PO Take by mouth.   naproxen sodium 220 MG tablet Commonly known as: ALEVE Take 220 mg by mouth daily.   olmesartan -hydrochlorothiazide  40-25 MG tablet Commonly known as: BENICAR  HCT Take 1 tablet by mouth daily.   OneTouch Ultra test strip Generic drug: glucose blood CHECK BLOOD SUGAR DAILY AS DIRECTED   Vitamin D3 Ultra Strength 125 MCG (5000 UT) capsule Generic drug: Cholecalciferol  Take 5,000 Units by mouth daily.   ZINC  PO Take by mouth.         Objective:   BP (!) 164/78   Pulse 97   Temp (!) 97.2 F (36.2 C)   Ht 5' 3 (1.6 m)   Wt 199 lb (90.3 kg)   SpO2 98%   BMI 35.25 kg/m   Wt Readings from Last 3 Encounters:  12/18/23 199 lb (90.3 kg)  10/09/23 199 lb (90.3 kg)  10/03/23 199 lb (90.3 kg)    Physical Exam Vitals and nursing note reviewed.  Constitutional:      Appearance: Normal appearance.  HENT:     Head:     Jaw: No tenderness, swelling or pain on movement.     Salivary Glands: Right salivary gland is diffusely enlarged and tender. Left salivary gland is not diffusely enlarged or tender.     Right Ear: Hearing, tympanic membrane, ear canal and external ear normal.     Mouth/Throat:     Lips: Pink.     Mouth: Mucous membranes are moist.     Pharynx: Oropharynx is clear.     Tonsils: No tonsillar exudate.  Neurological:     Mental Status: She is alert.    Physical Exam   HEENT: Tenderness on the right side of the face near the ear. CHEST: Lungs clear to auscultation bilaterally. CARDIOVASCULAR: Heart regular rate and rhythm.         Assessment & Plan:   Problem List Items Addressed This Visit   None Visit Diagnoses       Parotid gland enlargement    -  Primary   Relevant Medications   cephALEXin (KEFLEX) 500 MG capsule          Right facial swelling and pain, acute onset Acute right facial swelling and pain from lower jaw to  ear. No trauma, dental issues, or fever. No blood clot or dental abscess.  Hypertension Blood pressure elevated, likely due to anxiety from facial swelling. On antihypertensive medication and diuretic.  History of melanoma of back Melanoma treated 3-4 years ago. Regular skin checks with no new lesions. - Continue regular dermatological evaluations.      Will treat like  possible parotid gland inflammation and will send in Keflex and recommend sour candies and then follow-up in 2 to 3 weeks if not improved.    Follow up plan: Return if symptoms worsen or fail to improve.  Counseling provided for all of the vaccine components No orders of the defined types were placed in this encounter.   Fonda Levins, MD [facility] 12/18/2023, 3:01 PM

## 2024-01-03 ENCOUNTER — Ambulatory Visit: Attending: Vascular Surgery | Admitting: Vascular Surgery

## 2024-01-03 ENCOUNTER — Encounter: Payer: Self-pay | Admitting: Vascular Surgery

## 2024-01-03 VITALS — BP 131/78 | HR 72 | Temp 97.9°F | Ht 63.0 in | Wt 195.0 lb

## 2024-01-03 DIAGNOSIS — I872 Venous insufficiency (chronic) (peripheral): Secondary | ICD-10-CM

## 2024-01-03 NOTE — Progress Notes (Signed)
 Patient ID: Mckenzie Leonard, female   DOB: Jan 01, 1951, 73 y.o.   MRN: [id number]  Reason for Consult: Follow-up   Referred by Gladis Mary-Margaret, *  Subjective:     HPI:  Mckenzie Leonard is a 73 y.o. female without significant vascular history.  She does have edema of her left lower extremity which has been worsening with time.  She does have a knee replacement history 6 years ago and also had an IV placed in her left groin when she was in the hospital when they could not access any other veins in her upper or lower extremities.  She denies any personal or family history of DVT and has never had any venous intervention.  At last visit she was fitted for left lower extremity thigh-high compression stockings and she has been wearing.  Past Medical History:  Diagnosis Date   Arthritis    Diabetes mellitus without complication (HCC)    High cholesterol    Hypertension    Hypothyroidism    PONV (postoperative nausea and vomiting)    Thyroid  disease    Family History  Problem Relation Age of Onset   Arthritis Mother    Heart disease Mother        enlarged heart   Anemia Mother    Stroke Mother    Hearing loss Mother    Arthritis Father    Diabetes Father    Hypertension Father    Cancer Sister        breast   Diabetes Brother    CAD Brother    Healthy Daughter    Healthy Daughter    Past Surgical History:  Procedure Laterality Date   BREAST BIOPSY Right    negative   CARPAL TUNNEL RELEASE     Right   CLOSED REDUCTION MANDIBLE  02/20/2012   Procedure: CLOSED REDUCTION MANDIBULAR;  Surgeon: Lonni LITTIE Sax, DDS;  Location: [facility] OR;  Service: Oral Surgery;  Laterality: Right;  Closed reduction zygonach arch fx   COLONOSCOPY WITH PROPOFOL  N/A 10/25/2022   Procedure: COLONOSCOPY WITH PROPOFOL ;  Surgeon: Eartha Angelia Sieving, MD;  Location: [facility];  Service: Gastroenterology;  Laterality: N/A;  10:30am;asa 3   EXCISION MELANOMA WITH SENTINEL LYMPH NODE BIOPSY  Left 05/24/2021   Procedure: MELANOMA WIDE LOCAL EXCISION;  Surgeon: Signe Mitzie LABOR, MD;  Location: [facility] OR;  Service: General;  Laterality: Left;   JOINT REPLACEMENT     KNEE CARTILAGE SURGERY Left    ORIF FACIAL FRACTURE  02/20/2012   Procedure: OPEN REDUCTION INTERNAL FIXATION (ORIF) MULTIPLE FACIAL FRACTURES;  Surgeon: Lonni LITTIE Sax, DDS;  Location: [facility] OR;  Service: Oral Surgery;  Laterality: Right;  ORIF Rt [facility] (zygomaxillary)   POLYPECTOMY  10/25/2022   Procedure: POLYPECTOMY;  Surgeon: Eartha Angelia Sieving, MD;  Location: [facility];  Service: Gastroenterology;;   SENTINEL NODE BIOPSY Left 05/24/2021   Procedure: SENTINEL NODE BIOPSY;  Surgeon: Signe Mitzie LABOR, MD;  Location: [facility] OR;  Service: General;  Laterality: Left;   TOTAL KNEE ARTHROPLASTY Left 10/12/2016   Procedure: TOTAL KNEE ARTHROPLASTY;  Surgeon: Margrette Taft BRAVO, MD;  Location: [facility] ORS;  Service: Orthopedics;  Laterality: Left;    Short Social History:  Social History   Tobacco Use   Smoking status: Never   Smokeless tobacco: Never  Substance Use Topics   Alcohol use: No    Allergies  Allergen Reactions   Sulfa Antibiotics Anaphylaxis   Statins Other (See Comments)    One of  them caused muscle pain   Tetanus Toxoid-Containing Vaccines     Reports flu like symptoms and swelling at injection site only    Current Outpatient Medications  Medication Sig Dispense Refill   Blood Glucose Monitoring Suppl (BLOOD GLUCOSE METER KIT AND SUPPLIES) KIT Dispense based on patient and insurance preference. Use to check BG once daily.  ICD 10 code = E11.9 1 each 0   Blood Glucose Monitoring Suppl DEVI 1 each by Does not apply route in the morning, at noon, and at bedtime. May substitute to any manufacturer covered by patient's insurance. 1 each 0   Calcium  Citrate-Vitamin D  (CALCIUM  + D PO) Take 1 tablet by mouth daily.     cephALEXin (KEFLEX) 500 MG capsule Take 1 capsule (500 mg total) by mouth 4 (four)  times daily. 28 capsule 0   Cholecalciferol  (VITAMIN D3 ULTRA STRENGTH) 125 MCG (5000 UT) capsule Take 5,000 Units by mouth daily.     furosemide  (LASIX ) 20 MG tablet Take 1 tablet (20 mg total) by mouth 2 (two) times daily. 180 tablet 1   Iron-Vitamins (GERITOL PO) Take by mouth.     levothyroxine  (SYNTHROID ) 50 MCG tablet Take 1 tablet (50 mcg total) by mouth daily. 90 tablet 1   MAGNESIUM  PO Take by mouth.     Misc Natural Products (JOINT HEALTH PO) Take 1 tablet by mouth daily.     Multiple Vitamins-Minerals (ZINC  PO) Take by mouth.     naproxen sodium (ALEVE) 220 MG tablet Take 220 mg by mouth daily.     olmesartan -hydrochlorothiazide  (BENICAR  HCT) 40-25 MG tablet Take 1 tablet by mouth daily. 90 tablet 1   ONETOUCH ULTRA test strip CHECK BLOOD SUGAR DAILY AS DIRECTED 100 strip 9   No current facility-administered medications for this visit.    Review of Systems  Constitutional:  Constitutional negative. HENT: HENT negative.  Eyes: Eyes negative.  Cardiovascular: Positive for leg swelling.  GI: Gastrointestinal negative.  Musculoskeletal: Musculoskeletal negative.  Skin:       Thickened and indented of the left medial ankle Neurological: Neurological negative. Hematologic: Hematologic/lymphatic negative.  Psychiatric: Psychiatric negative.        Objective:  Objective   Vitals:   01/03/24 1117  BP: 131/78  Pulse: 72  Temp: 97.9 F (36.6 C)  SpO2: 95%     Physical Exam HENT:     Head: Normocephalic.     Nose: Nose normal.     Mouth/Throat:     Mouth: Mucous membranes are moist.  Cardiovascular:     Rate and Rhythm: Normal rate.     Pulses: Normal pulses.  Pulmonary:     Effort: Pulmonary effort is normal.  Abdominal:     General: Abdomen is flat.  Musculoskeletal:     Cervical back: Normal range of motion.     Right lower leg: No edema.     Left lower leg: Edema present.  Skin:    General: Skin is warm.     Capillary Refill: Capillary refill takes  less than 2 seconds.  Neurological:     General: No focal deficit present.     Mental Status: She is alert.  Psychiatric:        Mood and Affect: Mood normal.     Data: LEFT          Reflux NoRefluxReflux TimeDiameter cmsComments  Yes                                                 +--------------+---------+------+-----------+------------+-----------------  ----+  CFV                    yes   >1 second                                     +--------------+---------+------+-----------+------------+-----------------  ----+  FV mid        no                                                            +--------------+---------+------+-----------+------------+-----------------  ----+  Popliteal    no                                                            +--------------+---------+------+-----------+------------+-----------------  ----+  GSV at [facility]              yes    >500 ms      0.76                            +--------------+---------+------+-----------+------------+-----------------  ----+  GSV prox thigh          yes    >500 ms      0.68                            +--------------+---------+------+-----------+------------+-----------------  ----+  GSV mid thigh           yes    >500 ms      0.51                            +--------------+---------+------+-----------+------------+-----------------  ----+  GSV dist thigh          yes    >500 ms      0.52                            +--------------+---------+------+-----------+------------+-----------------  ----+  GSV at knee             yes    >500 ms      0.39                            +--------------+---------+------+-----------+------------+-----------------  ----+  GSV prox calf           yes    >500 ms      0.44                             +--------------+---------+------+-----------+------------+-----------------  ----+  GSV  mid calf            yes    >500 ms      0.48                            +--------------+---------+------+-----------+------------+-----------------  ----+  SSV at [facility]    no                            0.25    posterior thigh                                                             extension at  knee      +--------------+---------+------+-----------+------------+-----------------  ----+  SSV prox calf no                            0.26                            +--------------+---------+------+-----------+------------+-----------------  ----+  SSV mid calf  no                            0.25                            +--------------+---------+------+-----------+------------+-----------------  ----+         Summary:  Left:  - No evidence of deep vein thrombosis seen in the left lower extremity,  from the common femoral through the popliteal veins.  - No evidence of superficial venous thrombosis in the left lower  extremity.  - The deep venous sysincompetent at the common femoral vein.  - The great saphenous vein is grossly incompetent.  - The small saphenous vein is competent.      Assessment/Plan:    73 year old female with C4 a venous disease left lower extremity with thickened skin and reticular veins of the left foot likely with a component of lymphedema with history of knee replacement.  We discussed the need for thigh-high compression stockings likely indefinitely.  We have discussed venous reflux as 1 component of her skin changes and swelling in the knee.  We discussed the option of proceeding with left greater saphenous vein ablation with continue compression stockings versus compression stockings alone.  Patient currently wanting to proceed with left greater saphenous vein ablation which will likely be scheduled after the new year.  We discussed  the risk particularly of DVT and need for continued compression stockings indefinitely.  She demonstrates good understanding in the presence of her daughter.     Penne Lonni Colorado MD [facility]

## 2024-01-11 ENCOUNTER — Ambulatory Visit (INDEPENDENT_AMBULATORY_CARE_PROVIDER_SITE_OTHER): Payer: Self-pay | Admitting: Nurse Practitioner

## 2024-01-11 ENCOUNTER — Encounter: Payer: Self-pay | Admitting: Nurse Practitioner

## 2024-01-11 VITALS — BP 131/70 | HR 98 | Temp 97.2°F | Ht 63.0 in | Wt 193.0 lb

## 2024-01-11 DIAGNOSIS — E119 Type 2 diabetes mellitus without complications: Secondary | ICD-10-CM

## 2024-01-11 DIAGNOSIS — E876 Hypokalemia: Secondary | ICD-10-CM

## 2024-01-11 DIAGNOSIS — E1159 Type 2 diabetes mellitus with other circulatory complications: Secondary | ICD-10-CM | POA: Diagnosis not present

## 2024-01-11 DIAGNOSIS — I739 Peripheral vascular disease, unspecified: Secondary | ICD-10-CM

## 2024-01-11 DIAGNOSIS — E559 Vitamin D deficiency, unspecified: Secondary | ICD-10-CM

## 2024-01-11 DIAGNOSIS — Z6832 Body mass index (BMI) 32.0-32.9, adult: Secondary | ICD-10-CM

## 2024-01-11 DIAGNOSIS — E1169 Type 2 diabetes mellitus with other specified complication: Secondary | ICD-10-CM | POA: Diagnosis not present

## 2024-01-11 DIAGNOSIS — R600 Localized edema: Secondary | ICD-10-CM

## 2024-01-11 DIAGNOSIS — M8588 Other specified disorders of bone density and structure, other site: Secondary | ICD-10-CM

## 2024-01-11 DIAGNOSIS — E785 Hyperlipidemia, unspecified: Secondary | ICD-10-CM

## 2024-01-11 DIAGNOSIS — I152 Hypertension secondary to endocrine disorders: Secondary | ICD-10-CM

## 2024-01-11 LAB — CMP14+EGFR
ALT: 18 IU/L (ref 0–32)
AST: 21 IU/L (ref 0–40)
Albumin: 4.9 g/dL — ABNORMAL HIGH (ref 3.8–4.8)
Alkaline Phosphatase: 79 IU/L (ref 49–135)
BUN/Creatinine Ratio: 23 (ref 12–28)
BUN: 20 mg/dL (ref 8–27)
Bilirubin Total: 0.8 mg/dL (ref 0.0–1.2)
CO2: 27 mmol/L (ref 20–29)
Calcium: 9.7 mg/dL (ref 8.7–10.3)
Chloride: 94 mmol/L — ABNORMAL LOW (ref 96–106)
Creatinine, Ser: 0.87 mg/dL (ref 0.57–1.00)
Globulin, Total: 2.6 g/dL (ref 1.5–4.5)
Glucose: 111 mg/dL — ABNORMAL HIGH (ref 70–99)
Potassium: 3.8 mmol/L (ref 3.5–5.2)
Sodium: 140 mmol/L (ref 134–144)
Total Protein: 7.5 g/dL (ref 6.0–8.5)
eGFR: 69 mL/min/1.73 (ref >59)

## 2024-01-11 LAB — CBC WITH DIFFERENTIAL/PLATELET
Basophils Absolute: 0.1 x10E3/uL (ref 0.0–0.2)
Basos: 1 %
EOS (ABSOLUTE): 0.2 x10E3/uL (ref 0.0–0.4)
Eos: 3 %
Hematocrit: 39.7 % (ref 34.0–46.6)
Hemoglobin: 12.6 g/dL (ref 11.1–15.9)
Immature Grans (Abs): 0 x10E3/uL (ref 0.0–0.1)
Immature Granulocytes: 0 %
Lymphocytes Absolute: 1.5 x10E3/uL (ref 0.7–3.1)
Lymphs: 24 %
MCH: 29.2 pg (ref 26.6–33.0)
MCHC: 31.7 g/dL (ref 31.5–35.7)
MCV: 92 fL (ref 79–97)
Monocytes Absolute: 0.5 x10E3/uL (ref 0.1–0.9)
Monocytes: 8 %
Neutrophils Absolute: 4 x10E3/uL (ref 1.4–7.0)
Neutrophils: 64 %
Platelets: 314 x10E3/uL (ref 150–450)
RBC: 4.31 x10E6/uL (ref 3.77–5.28)
RDW: 14.4 % (ref 11.7–15.4)
WBC: 6.3 x10E3/uL (ref 3.4–10.8)

## 2024-01-11 LAB — BAYER DCA HB A1C WAIVED: HB A1C (BAYER DCA - WAIVED): 5.8 % — ABNORMAL HIGH (ref 4.8–5.6)

## 2024-01-11 LAB — LIPID PANEL
Chol/HDL Ratio: 5.2 ratio — ABNORMAL HIGH (ref 0.0–4.4)
Cholesterol, Total: 235 mg/dL — ABNORMAL HIGH (ref 100–199)
HDL: 45 mg/dL (ref >39)
LDL Chol Calc (NIH): 151 mg/dL — ABNORMAL HIGH (ref 0–99)
Triglycerides: 215 mg/dL — ABNORMAL HIGH (ref 0–149)
VLDL Cholesterol Cal: 39 mg/dL (ref 5–40)

## 2024-01-11 NOTE — Patient Instructions (Signed)

## 2024-01-11 NOTE — Progress Notes (Signed)
 Subjective:    Patient ID: Mckenzie Leonard, female    DOB: Oct 03, 1950, 73 y.o.   MRN: [id number]   Chief Complaint: medical management of chronic issues     HPI:  Mckenzie Leonard is a 73 y.o. who identifies as a female who was assigned female at birth.   Social history: Lives with: by herself- family checks on her frequently Work history: retired   Water Engineer in today for follow up of the following chronic medical issues:  1. Primary hypertension No c/o chest pain, sob or headache. Does not check blood pressure at home. BP Readings from Last 3 Encounters:  01/03/24 131/78  12/18/23 (!) 164/78  10/09/23 134/68     2. Hyperlipidemia associated with type 2 diabetes mellitus (HCC) Does not watch diet and does no dedicated exercise. She stopped taking statin due to myalgia. Refuses to try repatha Lab Results  Component Value Date   CHOL 215 (H) 10/03/2023   HDL 43 10/03/2023   LDLCALC 132 (H) 10/03/2023   TRIG 227 (H) 10/03/2023   CHOLHDL 5.0 (H) 10/03/2023   The 10-year ASCVD risk score (Arnett DK, et al., 2019) is: 38.2%   3. PAD (peripheral artery disease) (HCC) Mainly in bil lower ext. Not bothering her currently  4. Hypothyroidism (acquired) No issues that she is aware of Lab Results  Component Value Date   TSH 2.340 10/04/2022     5. Diet-controlled diabetes mellitus (HCC) Fasting blood sugars are running around 120-140. No low blood sugars Lab Results  Component Value Date   HGBA1C 5.9 (H) 10/03/2023      6. Hypokalemia No c/o muscle cramping Lab Results  Component Value Date   K 3.8 10/03/2023     7. Peripheral edema Has daily lower ext edema. Resolves mostly at night.  8. Vitamin D  deficiency Daily vitamin d  supplement  9. Osteopenia of lumbar spine Last dexscan as done on 03/28/22. T score was -1.0  10. BMI 32.0-32.9,adult Weight is unchanged  Wt Readings from Last 3 Encounters:  01/11/24 193 lb (87.5 kg)  01/03/24 195 lb (88.5 kg)   12/18/23 199 lb (90.3 kg)   BMI Readings from Last 3 Encounters:  01/11/24 34.19 kg/m  01/03/24 34.54 kg/m  12/18/23 35.25 kg/m       New complaints: None today  Allergies  Allergen Reactions   Sulfa Antibiotics Anaphylaxis   Statins Other (See Comments)    One of them caused muscle pain   Tetanus Toxoid-Containing Vaccines     Reports flu like symptoms and swelling at injection site only   Outpatient Encounter Medications as of 01/11/2024  Medication Sig   Blood Glucose Monitoring Suppl (BLOOD GLUCOSE METER KIT AND SUPPLIES) KIT Dispense based on patient and insurance preference. Use to check BG once daily.  ICD 10 code = E11.9   Blood Glucose Monitoring Suppl DEVI 1 each by Does not apply route in the morning, at noon, and at bedtime. May substitute to any manufacturer covered by patient's insurance.   Calcium  Citrate-Vitamin D  (CALCIUM  + D PO) Take 1 tablet by mouth daily.   cephALEXin (KEFLEX) 500 MG capsule Take 1 capsule (500 mg total) by mouth 4 (four) times daily.   Cholecalciferol  (VITAMIN D3 ULTRA STRENGTH) 125 MCG (5000 UT) capsule Take 5,000 Units by mouth daily.   furosemide  (LASIX ) 20 MG tablet Take 1 tablet (20 mg total) by mouth 2 (two) times daily.   Iron-Vitamins (GERITOL PO) Take by mouth.   levothyroxine  (SYNTHROID ) 50  MCG tablet Take 1 tablet (50 mcg total) by mouth daily.   MAGNESIUM  PO Take by mouth.   Misc Natural Products (JOINT HEALTH PO) Take 1 tablet by mouth daily.   Multiple Vitamins-Minerals (ZINC  PO) Take by mouth.   naproxen sodium (ALEVE) 220 MG tablet Take 220 mg by mouth daily.   olmesartan -hydrochlorothiazide  (BENICAR  HCT) 40-25 MG tablet Take 1 tablet by mouth daily.   ONETOUCH ULTRA test strip CHECK BLOOD SUGAR DAILY AS DIRECTED   No facility-administered encounter medications on file as of 01/11/2024.    Past Surgical History:  Procedure Laterality Date   BREAST BIOPSY Right    negative   CARPAL TUNNEL RELEASE     Right    CLOSED REDUCTION MANDIBLE  02/20/2012   Procedure: CLOSED REDUCTION MANDIBULAR;  Surgeon: Lonni LITTIE Sax, DDS;  Location: [facility] OR;  Service: Oral Surgery;  Laterality: Right;  Closed reduction zygonach arch fx   COLONOSCOPY WITH PROPOFOL  N/A 10/25/2022   Procedure: COLONOSCOPY WITH PROPOFOL ;  Surgeon: Eartha Angelia Sieving, MD;  Location: [facility] SUITE;  Service: Gastroenterology;  Laterality: N/A;  10:30am;asa 3   EXCISION MELANOMA WITH SENTINEL LYMPH NODE BIOPSY Left 05/24/2021   Procedure: MELANOMA WIDE LOCAL EXCISION;  Surgeon: Signe Mitzie LABOR, MD;  Location: [facility] OR;  Service: General;  Laterality: Left;   JOINT REPLACEMENT     KNEE CARTILAGE SURGERY Left    ORIF FACIAL FRACTURE  02/20/2012   Procedure: OPEN REDUCTION INTERNAL FIXATION (ORIF) MULTIPLE FACIAL FRACTURES;  Surgeon: Lonni LITTIE Sax, DDS;  Location: [facility] OR;  Service: Oral Surgery;  Laterality: Right;  ORIF Rt [facility] (zygomaxillary)   POLYPECTOMY  10/25/2022   Procedure: POLYPECTOMY;  Surgeon: Eartha Angelia Sieving, MD;  Location: [facility];  Service: Gastroenterology;;   SENTINEL NODE BIOPSY Left 05/24/2021   Procedure: SENTINEL NODE BIOPSY;  Surgeon: Signe Mitzie LABOR, MD;  Location: [facility] OR;  Service: General;  Laterality: Left;   TOTAL KNEE ARTHROPLASTY Left 10/12/2016   Procedure: TOTAL KNEE ARTHROPLASTY;  Surgeon: Margrette Taft BRAVO, MD;  Location: [facility] ORS;  Service: Orthopedics;  Laterality: Left;    Family History  Problem Relation Age of Onset   Arthritis Mother    Heart disease Mother        enlarged heart   Anemia Mother    Stroke Mother    Hearing loss Mother    Arthritis Father    Diabetes Father    Hypertension Father    Cancer Sister        breast   Diabetes Brother    CAD Brother    Healthy Daughter    Healthy Daughter       Controlled substance contract: n/a      Review of Systems  Constitutional:  Negative for diaphoresis.  Eyes:  Negative for pain.  Respiratory:  Negative  for shortness of breath.   Cardiovascular:  Negative for chest pain, palpitations and leg swelling.  Gastrointestinal:  Negative for abdominal pain.  Endocrine: Negative for polydipsia.  Skin:  Negative for rash.  Neurological:  Negative for dizziness, weakness and headaches.  Hematological:  Does not bruise/bleed easily.  All other systems reviewed and are negative.      Objective:   Physical Exam Vitals and nursing note reviewed.  Constitutional:      General: She is not in acute distress.    Appearance: Normal appearance. She is well-developed.  HENT:     Head: Normocephalic.     Right Ear: Tympanic membrane normal.  Left Ear: Tympanic membrane normal.     Nose: Nose normal.     Mouth/Throat:     Mouth: Mucous membranes are moist.  Eyes:     Pupils: Pupils are equal, round, and reactive to light.  Neck:     Vascular: No carotid bruit or JVD.  Cardiovascular:     Rate and Rhythm: Normal rate and regular rhythm.     Heart sounds: Normal heart sounds.  Pulmonary:     Effort: Pulmonary effort is normal. No respiratory distress.     Breath sounds: Normal breath sounds. No wheezing or rales.  Chest:     Chest wall: No tenderness.  Abdominal:     General: Bowel sounds are normal. There is no distension or abdominal bruit.     Palpations: Abdomen is soft. There is no hepatomegaly, splenomegaly, mass or pulsatile mass.     Tenderness: There is no abdominal tenderness.  Musculoskeletal:        General: Normal range of motion.     Cervical back: Normal range of motion and neck supple.     Right lower leg: Edema (2+) present.     Left lower leg: Edema (2+) present.  Lymphadenopathy:     Cervical: No cervical adenopathy.  Skin:    General: Skin is warm and dry.  Neurological:     Mental Status: She is alert and oriented to person, place, and time.     Deep Tendon Reflexes: Reflexes are normal and symmetric.  Psychiatric:        Behavior: Behavior normal.        Thought  Content: Thought content normal.        Judgment: Judgment normal.    There were no vitals taken for this visit.   HGBA1c 5.8%       Assessment & Plan:  CIONNA COLLANTES comes in today with chief complaint of medical management of chronic issues    Diagnosis and orders addressed:  1. Primary hypertension Low sodium diet - CBC with Differential/Platelet - CMP14+EGFR - olmesartan -hydrochlorothiazide  (BENICAR  HCT) 40-25 MG tablet; Take 1 tablet by mouth daily.  Dispense: 90 tablet; Refill: 1  2. Hyperlipidemia associated with type 2 diabetes mellitus (HCC) Low fat diet - Lipid panel  3. Statin intolerance   4. PAD (peripheral artery disease) (HCC)   5. Hypothyroidism (acquired) Labs pending - levothyroxine  (SYNTHROID ) 50 MCG tablet; Take 1 tablet (50 mcg total) by mouth daily.  Dispense: 90 tablet; Refill: 0  6. Diet-controlled diabetes mellitus (HCC) Continue to watch carbs in diet - Bayer DCA Hb A1c Waived - Microalbumin / creatinine urine ratio  7. Hypokalemia Labs pending  8. Peripheral edema Elevate legs when sitting Compression socks Added lasix  today - furosemide  (LASIX ) 20 MG tablet; Take 1 tablet (20 mg total) by mouth daily.  Dispense: 30 tablet; Refill: 3  9. Vitamin D  deficiency Continue vitamin d  supplement  10. Osteopenia of lumbar spine Weight bearing exercises encouraged  11. BMI 32.0-32.9,adult Discussed diet and exercise for person with BMI >25 Will recheck weight in 3-6 months    Labs reveiwed from hospital at appointment Health Maintenance reviewed Diet and exercise encouraged  Follow up plan: 6 months   Mckenzie Gladis, FNP

## 2024-01-12 LAB — MICROALBUMIN / CREATININE URINE RATIO
Creatinine, Urine: 61.2 mg/dL
Microalb/Creat Ratio: 8 mg/g{creat} (ref 0–29)
Microalbumin, Urine: 5.1 ug/mL

## 2024-01-15 ENCOUNTER — Ambulatory Visit: Payer: Self-pay | Admitting: Nurse Practitioner

## 2024-03-06 ENCOUNTER — Other Ambulatory Visit: Payer: Self-pay

## 2024-03-06 DIAGNOSIS — I83892 Varicose veins of left lower extremities with other complications: Secondary | ICD-10-CM

## 2024-03-13 ENCOUNTER — Other Ambulatory Visit: Payer: Self-pay

## 2024-03-13 MED ORDER — LORAZEPAM 1 MG PO TABS: ORAL_TABLET | ORAL | 0 refills | Status: AC

## 2024-03-21 ENCOUNTER — Ambulatory Visit: Attending: Vascular Surgery | Admitting: Vascular Surgery

## 2024-03-21 ENCOUNTER — Encounter: Payer: Self-pay | Admitting: Vascular Surgery

## 2024-03-21 VITALS — BP 157/77 | HR 83 | Temp 97.3°F | Resp 18 | Ht 63.0 in | Wt 196.0 lb

## 2024-03-21 DIAGNOSIS — I83892 Varicose veins of left lower extremities with other complications: Secondary | ICD-10-CM

## 2024-03-21 HISTORY — PX: LASER ABLATION: SHX1947

## 2024-03-21 NOTE — Progress Notes (Signed)
 "     Patient name: Mckenzie Leonard MRN: [id number] DOB: 02-Dec-1950 Sex: female  REASON FOR VISIT: Treatment of left lower extremity chronic venous insufficiency  HPI: Mckenzie Leonard is a 74 y.o. female without significant vascular history has worsening left lower extremity edema with history of knee replacement as well as left groin IV in the past.  No previous history of DVT.  She has been compliant with thigh-high compression stockings.  Skin changes of the left medial ankle remained stable without ulceration.  Current Outpatient Medications  Medication Sig Dispense Refill   Blood Glucose Monitoring Suppl (BLOOD GLUCOSE METER KIT AND SUPPLIES) KIT Dispense based on patient and insurance preference. Use to check BG once daily.  ICD 10 code = E11.9 1 each 0   Blood Glucose Monitoring Suppl DEVI 1 each by Does not apply route in the morning, at noon, and at bedtime. May substitute to any manufacturer covered by patient's insurance. 1 each 0   Calcium  Citrate-Vitamin D  (CALCIUM  + D PO) Take 1 tablet by mouth daily.     Cholecalciferol  (VITAMIN D3 ULTRA STRENGTH) 125 MCG (5000 UT) capsule Take 5,000 Units by mouth daily.     furosemide  (LASIX ) 20 MG tablet Take 1 tablet (20 mg total) by mouth 2 (two) times daily. 180 tablet 1   Iron-Vitamins (GERITOL PO) Take by mouth.     levothyroxine  (SYNTHROID ) 50 MCG tablet Take 1 tablet (50 mcg total) by mouth daily. 90 tablet 1   LORazepam  (ATIVAN ) 1 MG tablet Take 1 tablet 30-60 minutes prior to leaving house on day of office surgery. Bring 2nd tablet with you to the office. 2 tablet 0   MAGNESIUM  PO Take by mouth.     Misc Natural Products (JOINT HEALTH PO) Take 1 tablet by mouth daily.     Multiple Vitamins-Minerals (ZINC  PO) Take by mouth.     naproxen sodium (ALEVE) 220 MG tablet Take 220 mg by mouth daily.     olmesartan -hydrochlorothiazide  (BENICAR  HCT) 40-25 MG tablet Take 1 tablet by mouth daily. 90 tablet 1   ONETOUCH ULTRA test strip CHECK BLOOD  SUGAR DAILY AS DIRECTED 100 strip 9   No current facility-administered medications for this visit.    PHYSICAL EXAM: Vitals:   03/21/24 0827  BP: (!) 157/77  Pulse: 83  Resp: 18  Temp: (!) 97.3 F (36.3 C)  TempSrc: Temporal  Weight: 196 lb (88.9 kg)  Height: 5' 3 (1.6 m)   Awake alert and oriented Nonlabored respirations Left lower extremity edema and medial ankle changes stable consistent with C4b venous disease  PROCEDURE: Left greater saphenous vein laser ablation totaling 24 cm  TECHNIQUE: Mckenzie Leonard taken to the procedure room where she was placed supine on the procedure table sterilely prepped and draped in the left lower extremity in usual fashion and a timeout was called.  We began using ultrasound to evaluate the greater saphenous vein just below the knee.  The area was anesthetized with 1% lidocaine  and the vein was cannulated with a micropuncture needle followed by micropuncture wire and sheath.  We then placed a J-wire 3 cm proximal to the saphenofemoral junction under direct ultrasound visualization and placed an introducer sheath to a total of 26 cm and the laser was placed to a total of 24 cm on the sheath.  Tumescent anesthesia was instilled around the sheath using direct ultrasound evaluation.  The vein was then ablated for a total of 24 cm and a completion of the  common femoral vein was patent and compressible and the greater saphenous vein appeared sclerotic throughout its course.  A sterile compressive wrap was placed.  She tolerated the procedure well without immediate complication.  Plan will be for follow-up in 2 weeks with postablation duplex, she does not currently have symptoms on the right lower that require evaluation.  [facility] [phone]    "

## 2024-03-21 NOTE — Progress Notes (Signed)
" ° ° °   Laser Ablation Procedure    Date: 03/21/2024   Mckenzie Leonard DOB:[birth year]  Consent signed: Yes     Surgeon: Sheree  Procedure: Laser Ablation: left Greater Saphenous Vein  BP (!) 157/77 (BP Location: Left Arm, Patient Position: Sitting, Cuff Size: Large)   Pulse 83   Temp (!) 97.3 F (36.3 C) (Temporal)   Resp 18   Ht 5' 3 (1.6 m)   Wt 196 lb (88.9 kg)   BMI 34.72 kg/m   Tumescent Anesthesia: 300 cc 0.9% NaCl with 50 cc Lidocaine  HCL 1%  and 15 cc 8.4% NaHCO3 Local Anesthesia: 4 cc Lidocaine  HCL and NaHCO3 (ratio 2:1) 7 watts continuous mode    Total energy: 1203.7 joules    Total time: 200 seconds Treatment Length 24cm Laser Fiber Ref. #   [id number]                       Lot # [id number]  Patient tolerated procedure well  Notes:All staff members wore facial masks and facial shields/goggles. Pt had 1 mg tabs of ativan  at 0710 on 03/21/2024 prior to surgical procedure.   Description of Procedure:  After marking the course of the secondary varicosities, the patient was placed on the operating table in the supine position, and the left leg was prepped and draped in sterile fashion.   Local anesthetic was administered and under ultrasound guidance the saphenous vein was accessed with a micro needle and guide wire; then the mirco puncture sheath was placed.  A guide wire was inserted saphenofemoral junction , followed by a 5 french sheath.  The position of the sheath and then the laser fiber below the junction was confirmed using the ultrasound.  Tumescent anesthesia was administered along the course of the saphenous vein using ultrasound guidance. The patient was placed in Trendelenburg position and protective laser glasses were placed on patient and staff, and the laser was fired at 7 watts continuous mode for a total of 1203.7 joules.  Steri strip were applied to the insertion site  and ABD pads and thigh high compression stockings were applied.  Ace wrap bandages were applied  over the phlebectomy sites and at the top of the saphenofemoral junction. Blood loss was less than 15 cc.  Discharge instructions reviewed with patient and hardcopy of discharge instructions given to patient to take home. The patient was wheeled  out of the operating room having tolerated the procedure well.   "

## 2024-04-04 ENCOUNTER — Ambulatory Visit (HOSPITAL_COMMUNITY)

## 2024-04-04 ENCOUNTER — Ambulatory Visit (HOSPITAL_COMMUNITY): Admitting: Vascular Surgery

## 2024-07-08 ENCOUNTER — Ambulatory Visit: Admitting: Nurse Practitioner

## 2024-10-09 ENCOUNTER — Ambulatory Visit: Payer: Self-pay
